# Patient Record
Sex: Female | Born: 1975 | Race: Black or African American | Hispanic: No | Marital: Married | Smoking: Never smoker
Health system: Southern US, Community
[De-identification: ages and names within clinical notes are randomized; demographics above are authoritative.]

## PROBLEM LIST (undated history)

## (undated) DIAGNOSIS — E119 Type 2 diabetes mellitus without complications: Secondary | ICD-10-CM

## (undated) DIAGNOSIS — D219 Benign neoplasm of connective and other soft tissue, unspecified: Secondary | ICD-10-CM

## (undated) HISTORY — DX: Type 2 diabetes mellitus without complications: E11.9

---

## 2001-01-16 ENCOUNTER — Emergency Department (HOSPITAL_COMMUNITY): Admission: EM | Admit: 2001-01-16 | Discharge: 2001-01-16 | Payer: Self-pay | Admitting: Emergency Medicine

## 2001-01-16 ENCOUNTER — Encounter: Payer: Self-pay | Admitting: Emergency Medicine

## 2001-06-03 ENCOUNTER — Inpatient Hospital Stay (HOSPITAL_COMMUNITY): Admission: AD | Admit: 2001-06-03 | Discharge: 2001-06-03 | Payer: Self-pay | Admitting: Obstetrics and Gynecology

## 2001-06-14 ENCOUNTER — Other Ambulatory Visit: Admission: RE | Admit: 2001-06-14 | Discharge: 2001-06-14 | Payer: Self-pay | Admitting: Obstetrics and Gynecology

## 2001-08-20 ENCOUNTER — Encounter: Admission: RE | Admit: 2001-08-20 | Discharge: 2001-08-20 | Payer: Self-pay | Admitting: Obstetrics and Gynecology

## 2001-10-22 ENCOUNTER — Ambulatory Visit (HOSPITAL_COMMUNITY): Admission: RE | Admit: 2001-10-22 | Discharge: 2001-10-22 | Payer: Self-pay | Admitting: Obstetrics and Gynecology

## 2001-10-22 ENCOUNTER — Encounter: Payer: Self-pay | Admitting: Obstetrics and Gynecology

## 2001-10-25 ENCOUNTER — Encounter: Payer: Self-pay | Admitting: Obstetrics and Gynecology

## 2001-10-25 ENCOUNTER — Inpatient Hospital Stay (HOSPITAL_COMMUNITY): Admission: AD | Admit: 2001-10-25 | Discharge: 2001-10-27 | Payer: Self-pay | Admitting: Obstetrics and Gynecology

## 2001-10-26 ENCOUNTER — Encounter: Payer: Self-pay | Admitting: Obstetrics and Gynecology

## 2001-11-17 ENCOUNTER — Inpatient Hospital Stay (HOSPITAL_COMMUNITY): Admission: AD | Admit: 2001-11-17 | Discharge: 2001-11-17 | Payer: Self-pay | Admitting: Obstetrics and Gynecology

## 2001-12-15 ENCOUNTER — Inpatient Hospital Stay (HOSPITAL_COMMUNITY): Admission: AD | Admit: 2001-12-15 | Discharge: 2001-12-18 | Payer: Self-pay | Admitting: Obstetrics and Gynecology

## 2001-12-15 ENCOUNTER — Encounter (INDEPENDENT_AMBULATORY_CARE_PROVIDER_SITE_OTHER): Payer: Self-pay

## 2002-10-25 ENCOUNTER — Emergency Department (HOSPITAL_COMMUNITY): Admission: EM | Admit: 2002-10-25 | Discharge: 2002-10-26 | Payer: Self-pay | Admitting: Emergency Medicine

## 2002-10-26 ENCOUNTER — Encounter: Payer: Self-pay | Admitting: Emergency Medicine

## 2002-11-29 ENCOUNTER — Encounter: Admission: RE | Admit: 2002-11-29 | Discharge: 2002-12-12 | Payer: Self-pay | Admitting: Occupational Medicine

## 2004-12-25 ENCOUNTER — Ambulatory Visit: Payer: Self-pay | Admitting: Internal Medicine

## 2005-09-29 ENCOUNTER — Ambulatory Visit: Payer: Self-pay | Admitting: Internal Medicine

## 2011-01-24 NOTE — Discharge Summary (Signed)
[facility] of [facility]  Patient:    Virginia Mcdonald, Virginia Mcdonald Visit Number: [id number] MRN: [id number]          Service Type: OBS Location: [facility] Attending Physician:  Leonard Schwartz Dictated by:   Janine Limbo, M.D. Adm. Date:  10/25/01 Disc. Date: 10/27/01   CC:         Daisey Must, M.D. [facility]   Discharge Summary  DISCHARGE DIAGNOSES:          1. Gestation at 31 weeks.                               2. Third trimester bleeding of uncertain                                  etiology.                               3. Gestational diabetes.                               4. Tachycardia and hypoxemia believed to be                                  secondary to medication.  PROCEDURES THIS ADMISSION:    None.  HISTORY OF PRESENT ILLNESS:   Virginia Mcdonald is a 35 year old female, gravida 4, para 2-0-1-2, who presented to the emergency department at [facility] on October 25, 2001, complaining of a slight amount of vaginal bleeding.  The patient is currently [redacted] weeks pregnant [facility] December 29, 2001).  She reported having irregular mild contractions.  Please see her dictated History & Physical exam for details.  Physical exam showed the cervix closed, long, and high.  The patient was noted to have mild contractions.  Her vital signs were stable.  HOSPITAL COURSE:              The patient was evaluated in the emergency department at [facility].  She was given Procardia to relieve the contractions.  Shortly after her medication, she noticed the sudden onset of tachycardia.  Her oxygen saturation dropped to 45% and then slowly rose back to 99% on oxygen.  Her blood pressure was 117/54.  Her blood sugar was 119. There was an increase in the fetal heart rate to 160 to 170 beats per minute. Evaluation included a chest x-ray which showed a normal heart size and was basically clear except for slight increase in pulmonary markings.  Her hemoglobin was 12.9.   Her white blood cell count was 7900.  Her platelet count was 145,000.  Her chemistries were within normal limits.  Her arterial blood gas showed a pH of 7.45, PCO2 of 32, and a PO2 of 68.  Her EKG was within normal limits.  An ultrasound was obtained that showed a single intrauterine gestation in a cephalic presentation.  The other parameters were within normal limits.  Her D-dimer was within normal limits.  A CT scan was obtained, and it was not consistent with a pulmonary embolus. Slowly, the patients symptoms all resolved.  It was thought that the tachycardia and the hypoxemia probably was a reflex reaction to the Procardia  that she had received.  By October 27, 2001, the patient was feeling much better.  She was not having any bleeding.  The fetal heart tones were all reassuring.  The decision was made to discharge the patient to home.   The patient did have a 2-D echocardiogram performed, and it was thought to be within normal limits.  DISCHARGE INSTRUCTIONS:       The patient will return to see Dr. Stefano Gaul for routine obstetrical care.  She will call for any other questions or concerns.Dictated by:   Janine Limbo, M.D. Attending Physician:  Leonard Schwartz DD:  12/18/01 TD:  12/19/01 Job: [phone] [id number]

## 2011-01-24 NOTE — H&P (Signed)
[facility] of [facility]  Patient:    Virginia Mcdonald, Virginia Mcdonald Visit Number: [id number] MRN: [id number]          Service Type: OBS Location: [facility] Attending Physician:  Shaune Spittle Dictated by:   Janine Limbo, M.D. Admit Date:  11/17/2001 Discharge Date: 11/17/2001                           History and Physical  DATE OF SURGERY:              December 15, 2001  HISTORY OF PRESENT ILLNESS:   Virginia Mcdonald is a 35 year old female, gravida 4, para 2-0-0-1-2, who presents at [redacted] weeks gestation (EDC is December 28, 2001) for a cesarean section and tubal ligation.  The patient has been followed at [facility] and Gynecology for this pregnancy that has been complicated by the fact that her infant is currently in a breech position and she has oligohydramnios.  In addition, she has a history of macrosomia.  She also has gestational diabetes mellitus and her sugars have been well controlled on diet only.  She also has a history of thrombocytopenia.  OBSTETRICAL HISTORY:          In 1994, patient had a 9 pound, 10 ounce delivery at term.  In 1996, she had an 8 pound, 12 ounce vaginal delivery at term.  In March 2001, she had an elective first-trimester pregnancy termination.  DRUG ALLERGIES:               None known.  PAST MEDICAL HISTORY:         The patient had rheumatic fever as a baby.  She is without sequelae at this point.  SOCIAL HISTORY:               The patient denies cigarette use, alcohol use, and recreational drug use.  REVIEW OF SYSTEMS:            Normal pregnancy complaints.  FAMILY HISTORY:               Noncontributory.  PHYSICAL EXAMINATION:  VITAL SIGNS:                  Weight is 143 pounds.  HEENT:                        Within normal limits.  CHEST:                        Clear.  HEART:                        Regular rate and rhythm.  BREASTS:                      Without masses.  ABDOMEN:                       Gravid with a fundal height of 37 cm.  EXTREMITIES:                  Within normal limits.  NEUROLOGIC:                   Exam is grossly normal.  PELVIC:  Cervix is closed and long.  LABORATORY VALUES:            Blood type is O positive, antibody screen negative, sickle cell negative, VDRL nonreactive, rubella immune.  HBsAg negative.  HIV nonreactive.  GC negative.  Chlamydia negative. Alpha-fetoprotein within normal limits.  Third-trimester beta strep is positive.  ASSESSMENT:                   1. Term intrauterine pregnancy.                               2. Breech presentation.                               3. Desires sterilization.                               4. History of macrosomia.                               5. Well-controlled gestational diabetes mellitus                                  by diet.  PLAN:                         The patient will undergo a primary low-transverse cesarean section and tubal ligation.  She understands the indications for her procedure and she accepts the risks of, but not limited to, anesthetic complications, bleeding, infections, possible damage to surrounding organs, and possible tubal failure (17 per 1000). Dictated by:   Janine Limbo, M.D. Attending Physician:  Shaune Spittle DD:  12/11/01 TD:  12/11/01 Job: [id number]

## 2011-01-24 NOTE — Discharge Summary (Signed)
[facility]  Patient:    Virginia Mcdonald, Virginia Mcdonald Visit Number: [id number] MRN: [id number]          Service Type: OBS Location: [facility] Attending Physician:  Leonard Schwartz Dictated by:   Wynelle Bourgeois, CNM Adm. Date:  12/15/01 Disc. Date: 12/18/01                             Discharge Summary  DATE OF BIRTH:                Jun 05, 1976  ADMISSION DIAGNOSES:          1. Term pregnancy.                               2. Footling breach presentation.                               3. Sterilization request.  DISCHARGE DIAGNOSES:          1. Term pregnancy.                               2. Footling breach presentation.                               3. Sterilization request.                               4. Status post primary low transverse cesarean                                  section of a female infant named Malia,                                  weight 6 pounds 15 ounces, Apgars 9/9.  PROCEDURES:                   1. Spinal anesthesia.                               2. Primary low transverse cesarean section.                               3. Elective bilateral tubal ligation.  HOSPITAL COURSE:              The patient was admitted for an elective primary low transverse cesarean section and tubal ligation for footling breach presentation.  This was performed by Dr. Stefano Gaul, and the patient did well. On postop day #1, hemoglobin was 11.3, and she progressed well with taking food and converting to analgesic by mouth.  On the third postop day, her vital signs were stable.  She was afebrile.  Heart had regular rate and rhythm. Lungs were clear.  Abdomen was mildly distended with positive bowel sounds. Incision with staples was clean, dry, and intact.  Extremities were within normal limits.  Lochia was small to moderated.  She was deemed to have received full benefit of her hospital stay and was discharged home.  DISCHARGE MEDICATIONS:         1. Motrin 600 mg p.o. q.6h. p.r.n.                               2. Tylox 1 to 2 p.o. q.3-4h. p.r.n.                               3. Prenatal vitamins.  DISCHARGE LABORATORY DATA:    White blood cell count 9.8, hemoglobin 11.3, hematocrit 33.1, platelets 107.  RPR nonreactive.  DISCHARGE INSTRUCTIONS:       Per [facility] handout.  FOLLOWUP:                     In six weeks at [facility] or p.r.n. Dictated by:   Wynelle Bourgeois, CNM Attending Physician:  Leonard Schwartz DD:  12/18/01 TD:  12/19/01 Job: [id number]

## 2011-01-24 NOTE — Op Note (Signed)
[facility] of [facility]  Patient:    Virginia Mcdonald, Virginia Mcdonald Visit Number: [id number] MRN: [id number]          Service Type: OBS Location: [facility] Attending Physician:  Leonard Schwartz Dictated by:   Janine Limbo, M.D. Proc. Date: 12/15/01                             Operative Report  PREOPERATIVE DIAGNOSES:       1. Term intrauterine pregnancy.                               2. Breech presentation.                               3. Desires sterilization.  POSTOPERATIVE DIAGNOSES:      1. Term intrauterine pregnancy.                               2. Footling breech presentation.                               3. Desires sterilization.  PROCEDURE:                    Primary low transverse cesarean section and bilateral tubal ligation.  SURGEON:                      Janine Limbo, M.D.  FIRST ASSISTANT:              Mack Guise, C.N.M.  ANESTHESIA:                   Spinal.  DISPOSITION:                  Ms. Crosley is a 35 year old female gravida 4, para 2-0-1-2 who presents at term.  She has a breech infant.  She understands the indications for her procedure and she accepts the risks of, but not limited to, anesthetic complications, bleeding, infections, and possible damage to the surrounding organs.  This pregnancy has been complicated by well controlled gestational diabetes.  The patient also has had oligohydramnios.  FINDINGS:                     A 6 pound 15 ounce female infant (Nelia) was delivered from a double footling breech presentation.  The Apgars were 9 at one minute and 9 at five minutes.  The uterus, fallopian tubes, and ovaries were normal for the gravid state.  PROCEDURE:                    The patient was taken to the operating room where a spinal anesthetic was given.  The patients abdomen and perineum were prepped with multiple layers of Betadine.  A Foley catheter was placed in the bladder.  The patient was sterilely  draped.  A low transverse incision was made in the abdomen and carried through the subcutaneous tissue, the fascia, and the anterior peritoneum.  An incision was made in the lower uterine segment and extended transversely.  The infant was delivered from a double footling breech presentation.  The cord was clamped and cut.  The infant was handed to the awaiting pediatric team.  Routine cord blood studies were obtained.  The placenta was removed.  The uterine cavity was cleaned of amniotic fluid and clotted blood.  The uterine incision was closed using a running locking suture of 2-0 Vicryl.  Hemostasis was adequate.  The left fallopian tube was identified and followed to its fimbriated end.  A knuckle of tube was made on the left using a free tie and then a suture ligature of 0 plain catgut.  The knuckle of tube thus made was excised.  An identical procedure was carried out on the opposite tube.  Again, hemostasis was noted to be adequate.  The pelvis and the abdominal cavity were vigorously irrigated.  Hemostasis was noted to be adequate.  The anterior peritoneum and the abdominal musculature were reapproximated in the midline using 2-0 Vicryl. The fascia was closed using a running suture of 0 Vicryl followed by three interrupted sutures of 0 Vicryl.  The subcutaneous layer was irrigated and hemostasis was noted to be adequate.  The subcutaneous layer was closed using interrupted sutures of 2-0 Vicryl.  The skin was reapproximated using skin staples.  Sponge, needle, and instrument count were correct on two occasions. The estimated blood loss for the procedure was 700 cc.  The patient tolerated her procedure well.  The patient was taken to the recovery room in stable condition.  The infant was taken to the full-term nursery in stable condition. Dictated by:   Janine Limbo, M.D. Attending Physician:  Leonard Schwartz DD:  12/15/01 TD:  12/16/01 Job: [phone] [id number]

## 2011-01-24 NOTE — Consult Note (Signed)
[facility] of [facility]  Patient:    Virginia Mcdonald, Virginia Mcdonald Visit Number: [id number] MRN: [id number]          Service Type: OBS Location: 9400 [id number] Attending Physician:  Leonard Schwartz Dictated by:   Daisey Must, M.D. [facility] Consultation Date: 10/26/01 Admit Date:  10/25/2001   CC:         Virginia Mcdonald, M.D.  Naima A. Normand Sloop, M.D.   Consultation Report  REASON FOR CONSULTATION:      Tachycardia and hypoxia.  HISTORY OF PRESENT ILLNESS:   Virginia Mcdonald is a 35 year old African female who is in her 31st week of her third pregnancy.  She presented to the hospital last evening with vaginal bleeding.  She was found to have contractions.  She was given, according to the patient, 10 mg of nifedipine p.o. x2.  She subsequently became very tachycardic and hypoxic with a pO2 of 51 on room air. By verbal report it was said that her heart rate increased to as high as 200; however, EKG available in the chart shows a heart rate of 143.  No other rhythm strips were available.  Apparently, her tachycardia subsequently resolved.  She underwent a chest x-ray and CT scan of the chest.  This showed no evidence of pulmonary emboli.  Chest x-ray showed clear lung fields with normal heart size.  The above symptoms subsequently resolved and she has not had any recurrences.  The patient denies any prior history of chest pain, dyspnea, palpitations, dizziness, or presyncope.  She states today that she still has some mild residual dyspnea, but otherwise feels well.  Currently, she has an oxygen saturation on room air of 95%.  PAST MEDICAL HISTORY:         Notable for gestational diabetes and history of rheumatic fever as a child.  MEDICATIONS:                  None.  FAMILY HISTORY:               As noted in the physicians assistant note on the chart.  SOCIAL HISTORY:               As noted in the physicians assistant note on the chart.  REVIEW OF SYSTEMS:             As noted in the physicians assistant note on the chart.  PHYSICAL EXAMINATION  GENERAL:                      This is a well appearing young woman in no acute distress.  VITAL SIGNS:                  Pulse 80 and regular, blood pressure 116/60, oxygen saturation on room air 95%.  SKIN:                         Warm and dry without generalized rash.  HEENT:                        Normocephalic, atraumatic.  Sclerae:  Anicteric. Oral mucosa:  Unremarkable.  NECK:                         No thyromegaly or adenopathy.  No JVD.  Carotid upstroke normal without bruits.  CHEST:  Clear to percussion and auscultation.  CARDIAC:                      Regular rate and rhythm.  Normal S1 and S2 with a 1/6 systolic ejection murmur loudest at the apex.  ABDOMEN:                      Gravid, soft, nontender.  Positive bowel sounds.  EXTREMITIES:                  No clubbing, cyanosis, edema with intact distal pulses.  LABORATORIES:                 EKG last evening shows sinus tachycardia at a rate of 143 with nonspecific ST and T-wave changes.  Other laboratory data is noted in the chart.  ASSESSMENT:                   Tachycardia and hypoxia after given p.o. nifedipine.  The patient states herself that she actually received two doses of p.o. nifedipine preceding the episode of tachycardia.  This suggests then that the tachycardia may have been reflex to oral rapid acting nifedipine. However, also of concern is the patients hypoxia at the time of this event. Certainly, would also consider the possibility of pulmonary embolism or fluid embolism as the etiology for the above events.  Also of concern is a persistent alveolar arterial oxygen gradient.  Current blood gas on room air reveals a pH of 7.45, pCO2 32, pO2 68 which is an alveolar arterial oxygen gradient of 42 with the normal being less than 15-25.  This is still suspicious for the possibility of pulmonary  embolism.  RECOMMENDATIONS:              1. Check a D-dimer to rule out thrombosis.  If                                  this is elevated would pursue venous Dopplers                                  to rule out deep venous thrombosis.  If                                  venous Dopplers do show DVT the patient would                                  require anticoagulation.  If there is no                                  evidence of DVT, but the patient still has an                                  elevated D-dimer and cannot rule out the  possibility of pulmonary embolism, then would                                  consider consulting pulmonary medicine for                                  their opinion.                               2. Based on her history of rheumatic heart                                  disease and her episode of tachycardia, would                                  recommend a 2-D echocardiogram.  This can be                                  obtained either as an in patient or as an                                  outpatient.                               3. In the absence of recurrent tachy                                  palpitations, would not recommend outpatient                                  Holter monitoring at this time.  However, if                                  the patient has a recurrent episode of tachy                                  palpitations, would consider placing an event                                  monitor or a 24-hour Holter monitor as an                                  outpatient.  Please let me know if I can be                                  of further assistance. Dictated by:   Daisey Must, M.D. [facility] Attending Physician:  Leonard Schwartz DD:  10/26/01 TD:  10/26/01 Job: [id number]

## 2011-01-24 NOTE — H&P (Signed)
[facility] of [facility]  Patient:    Virginia Mcdonald, Virginia Mcdonald Visit Number: [id number] MRN: [id number]          Service Type: OBS Location: 9400 [id number] Attending Physician:  Leonard Schwartz Dictated by:   Wynelle Bourgeois, CNM Admit Date:  10/25/2001                           History and Physical  HISTORY OF PRESENT ILLNESS:   This is a 35 year old gravida 4, para 2-0-1-2 at 31-0/7 weeks who presented to the MAU with complaints of one episode of bright red spotting this morning.  She was reporting irregular contractions, which were painful.  Her pregnancy had been remarkable for:  1. Hyperemesis. 2. History of macrosomia. 3. History of rapid labor. 4. Diet-controlled gestational diabetes.  Her cervix was closed, long, and high and she was treated with one dose of Procardia for preterm contractions, which were noted on the monitor. Concurrently with dosing of Procardia (that is, before the Procardia had taken effect), the patient began to complain of her heart racing and her heart rate was noted to be 160-200 beats per minute.  The patient denied chest pain, shortness of breath, confusion, or abdominal pain.  Her primary complaint was of her heart racing.  She was found to have an oxygen saturation which dropped to 45% and resolved up to 99% after the administration of 8 L of oxygen by face mask.  A stat EKG and chest x-ray and blood work were done.  The EKG revealed sinus tachycardia without ectopy with nonspecific ST and T wave abnormality.  She was seen immediately by this nurse midwife and Dr. Stefano Gaul in the MAU and decision was made to admit her to the AICU for further assessment and monitoring.  OBJECTIVE DATA:  VITAL SIGNS:                  Vital signs are currently stable with sinus tachycardia of 107, respiratory rate of 22, and a blood pressure of 117/66. Oxygen saturation is 100% on face mask oxygen at 8 L per minute.  Her previous heart rate  had been 200 beats per minute.  S1 and S2 audible without murmur. EKG, as stated before, denoted sinus tachycardia without ectopy and nonspecific ST and T wave abnormality and moderate voltage criteria for left ventricular hypertrophy, which may be a normal variant.  Her chest x-ray is within normal limits with normal heart size.  Arterial blood gas on oxygen shows a pH of 7.4, a PO2 of 51.6, PCO2 of 31.4, bicarbonate of 19.1, base deficit 4.1, and oxygen saturation of 99%.  CBC was within normal limits with a hemoglobin of 12.9 and remarkable for a platelet count of 145.  Metabolic chemistries were all within normal limits.  Her glucose level was 118. Ultrasound showed cephalic presentation, fundal placenta at grade 1, AFI of 9.2, BPP 8/8, cervix 3.5 cm.  CHEST:                        Clear to auscultation anterior and posterior.  HEART:                        Heart rate regular rhythm with tachycardia.  No murmur auscultated.  ABDOMEN:                      Gravid at 79  cm.  Fetal heart rate tracing is reassuring and reactive.  Uterine contractions before treatment upon admission were every 4-7 minutes and are now absent.  CERVICAL:                     Closed, long, and high.  Speculum exam revealed a thin, light-brown discharge with no lesions and no blood in the vault.  GC, chlamydia, wet prep, and group B Strep cultures were sent.  UA was within normal limits.  Her cultures were pending.  ASSESSMENT:                   1. Intrauterine pregnancy at 31 weeks.                               2. Spotting.                               3. Preterm uterine contractions, now resolved.                               4. Tachycardia with unknown etiology and without                                  hemodynamic compromise.                               5. Hypoxia, rule out pulmonary embolism,                                  amniotic fluid embolism, and placental                                   abruption.  PLAN:                         1. Admit to adult intensive care unit per                                  Dr. Stefano Gaul.                               2. Spiral CT.                               3. Observation in the adult intensive care unit                                  and further orders to follow. Dictated by:   Wynelle Bourgeois, CNM Attending Physician:  Leonard Schwartz DD:  10/25/01 TD:  10/25/01 Job: [id number]

## 2012-11-30 ENCOUNTER — Other Ambulatory Visit: Payer: Self-pay | Admitting: Obstetrics and Gynecology

## 2012-12-01 LAB — PAP IG W/ RFLX HPV ASCU

## 2013-05-22 ENCOUNTER — Emergency Department (HOSPITAL_COMMUNITY)
Admission: EM | Admit: 2013-05-22 | Discharge: 2013-05-22 | Disposition: A | Discharge status: Home / Self Care | Payer: BC Managed Care – PPO | Attending: Emergency Medicine | Admitting: Emergency Medicine

## 2013-05-22 ENCOUNTER — Encounter (HOSPITAL_COMMUNITY): Payer: Self-pay

## 2013-05-22 DIAGNOSIS — M545 Low back pain, unspecified: Secondary | ICD-10-CM | POA: Insufficient documentation

## 2013-05-22 DIAGNOSIS — M79609 Pain in unspecified limb: Secondary | ICD-10-CM | POA: Insufficient documentation

## 2013-05-22 DIAGNOSIS — M791 Myalgia, unspecified site: Secondary | ICD-10-CM

## 2013-05-22 DIAGNOSIS — R519 Headache, unspecified: Secondary | ICD-10-CM

## 2013-05-22 DIAGNOSIS — Z880 Allergy status to penicillin: Secondary | ICD-10-CM | POA: Insufficient documentation

## 2013-05-22 DIAGNOSIS — IMO0001 Reserved for inherently not codable concepts without codable children: Secondary | ICD-10-CM | POA: Insufficient documentation

## 2013-05-22 DIAGNOSIS — R51 Headache: Secondary | ICD-10-CM | POA: Insufficient documentation

## 2013-05-22 LAB — POCT I-STAT, CHEM 8
BUN: 16 mg/dL (ref 6–23)
Calcium, Ion: 1.22 mmol/L (ref 1.12–1.23)
Chloride: 102 mEq/L (ref 96–112)
Creatinine, Ser: 1.6 mg/dL — ABNORMAL HIGH (ref 0.50–1.10)
Glucose, Bld: 98 mg/dL (ref 70–99)
HCT: 44 % (ref 36.0–46.0)
Hemoglobin: 15 g/dL (ref 12.0–15.0)
Potassium: 5.6 mEq/L — ABNORMAL HIGH (ref 3.5–5.1)
Sodium: 139 mEq/L (ref 135–145)
TCO2: 29 mmol/L (ref 0–100)

## 2013-05-22 LAB — BASIC METABOLIC PANEL
BUN: 12 mg/dL (ref 6–23)
CO2: 25 mEq/L (ref 19–32)
Calcium: 9.6 mg/dL (ref 8.4–10.5)
Chloride: 100 mEq/L (ref 96–112)
Creatinine, Ser: 1.16 mg/dL — ABNORMAL HIGH (ref 0.50–1.10)
GFR calc Af Amer: 69 mL/min — ABNORMAL LOW (ref >90)
GFR calc non Af Amer: 60 mL/min — ABNORMAL LOW (ref >90)
Glucose, Bld: 104 mg/dL — ABNORMAL HIGH (ref 70–99)
Potassium: 4.1 mEq/L (ref 3.5–5.1)
Sodium: 135 mEq/L (ref 135–145)

## 2013-05-22 MED ORDER — IBUPROFEN 800 MG PO TABS
800.0000 mg | ORAL_TABLET | Freq: Once | ORAL | Status: AC
  Administered 2013-05-22: 800 mg via ORAL
  Filled 2013-05-22: qty 1

## 2013-05-22 MED ORDER — NAPROXEN 500 MG PO TABS: 500.0000 mg | ORAL_TABLET | Freq: Two times a day (BID) | ORAL | Status: DC

## 2013-05-22 NOTE — ED Provider Notes (Signed)
This chart was scribed for [name], a non-physician practitioner working with [name], MD by Lewanda Rife, ED Scribe. This patient was seen in room WTR7/WTR7 and the patient's care was started at 2120.    CSN: [id number]     Arrival date & time 05/22/13  2023 History   First MD Initiated Contact with Patient 05/22/13 2031     Chief Complaint  Patient presents with  . Extremity Pain  . Headache   The history is provided by the patient.   HPI Comments: Virginia Mcdonald is a 37 y.o. female who presents to the Emergency Department complaining of waxing and waning moderate bilateral arms and bilateral leg pain onset last night. Describes pain as sharp and burning. Reports associated constant moderate left sided headache, and lower back pain. Denies any aggravating factors. Denies associated weakness, numbness, change in activity, fevers, chills, sweats, sick contacts, neck pain, recent travels, emesis, nausea, abdominal pain, swelling, recent trauma, dysuria, hematuria, frequency with urination, menstrand recent injuries. Reports taking 200 mg of ibuprofen yesterday and today with moderate relief of symptoms. Reports hx of migraines. Denies any other pertinent PMHx and familial hx. Marland Kitchen   LMP May 15, 2013    History reviewed. No pertinent past medical history. Past Surgical History  Procedure Laterality Date  . Cesarean section     History reviewed. No pertinent family history. History  Substance Use Topics  . Smoking status: Never Smoker   . Smokeless tobacco: Not on file  . Alcohol Use: No   OB History   Grav Para Term Preterm Abortions TAB SAB Ect Mult Living                 Review of Systems  Neurological: Positive for headaches.  All other systems reviewed and are negative.   A complete 10 system review of systems was obtained and all systems are negative except as noted in the HPI and PMH.     Allergies  Penicillins and Procardia  Home  Medications   Current Outpatient Rx  Name  Route  Sig  Dispense  Refill  . ibuprofen (ADVIL,MOTRIN) 200 MG tablet   Oral   Take 200 mg by mouth every 6 (six) hours as needed for pain (pain).          BP 101/63  Pulse 72  Temp(Src) 98.3 F (36.8 C) (Oral)  Resp 16  SpO2 100%  LMP 05/15/2013 Physical Exam  Nursing note and vitals reviewed. Constitutional: She is oriented to person, place, and time. She appears well-developed and well-nourished. No distress.  HENT:  Head: Normocephalic and atraumatic.  Eyes: Conjunctivae and EOM are normal. Pupils are equal, round, and reactive to light. No scleral icterus. Right eye exhibits no nystagmus. Left eye exhibits no nystagmus.  Neck: Normal range of motion and full passive range of motion without pain. Neck supple. No spinous process tenderness and no muscular tenderness present. No tracheal deviation present.  Cardiovascular: Normal rate and regular rhythm.   No murmur heard. Pulmonary/Chest: Effort normal and breath sounds normal. No respiratory distress. She has no wheezes.  Abdominal: Soft. There is no tenderness. There is no CVA tenderness.  Musculoskeletal: Normal range of motion. She exhibits no edema and no tenderness.       Cervical back: Normal. She exhibits no tenderness and no bony tenderness.       Thoracic back: Normal. She exhibits no tenderness and no bony tenderness.       Lumbar back:  Normal. She exhibits no bony tenderness and no swelling.  Neurological: She is alert and oriented to person, place, and time. She has normal strength. No sensory deficit.  Skin: Skin is warm and dry. No rash noted. No erythema.  Psychiatric: She has a normal mood and affect. Her behavior is normal.    ED Course  Procedures  Medications  ibuprofen (ADVIL,MOTRIN) tablet 800 mg (800 mg Oral Given 05/22/13 2137)   Results for orders placed during the hospital encounter of 05/22/13  BASIC METABOLIC PANEL      Result Value Range   Sodium  135  135 - 145 mEq/L   Potassium 4.1  3.5 - 5.1 mEq/L   Chloride 100  96 - 112 mEq/L   CO2 25  19 - 32 mEq/L   Glucose, Bld 104 (*) 70 - 99 mg/dL   BUN 12  6 - 23 mg/dL   Creatinine, Ser 1.61 (*) 0.50 - 1.10 mg/dL   Calcium 9.6  8.4 - 09.6 mg/dL   GFR calc non Af Amer 60 (*) >90 mL/min   GFR calc Af Amer 69 (*) >90 mL/min  POCT I-STAT, CHEM 8      Result Value Range   Sodium 139  135 - 145 mEq/L   Potassium 5.6 (*) 3.5 - 5.1 mEq/L   Chloride 102  96 - 112 mEq/L   BUN 16  6 - 23 mg/dL   Creatinine, Ser 0.45 (*) 0.50 - 1.10 mg/dL   Glucose, Bld 98  70 - 99 mg/dL   Calcium, Ion 4.09  8.11 - 1.23 mmol/L   TCO2 29  0 - 100 mmol/L   Hemoglobin 15.0  12.0 - 15.0 g/dL   HCT 91.4  78.2 - 95.6 %          MDM   1. Headache   2. Myalgia      Patient seen and evaluated. She appears well in no acute distress. She has a normal nonfocal neuro exam. Symptoms have been intermittent. She has been doing increased exercise ending but states she feels well hydrated. Will quickly checked i-STAT Chem-8 for electrolytes.  Recheck of potassium and creatinine much better on lab BMP. At this time do not feel there are any emergent condition causing patient's symptoms. She already reports significant improvement of headache and pain symptoms after ibuprofen. At this time will continued recommendations for NSAID treatment and followup with PCP.  I personally performed the services described in this documentation, which was scribed in my presence. The recorded information has been reviewed and is accurate.   Angus Seller, PA-C 05/23/13 [phone]

## 2013-05-22 NOTE — ED Notes (Signed)
Pt states started yesterday with bilateral leg pain then radiated to bilateral arms.  Feels heavy, aches.  Pt states also feels like bilateral hands are stiff.  Does claim she exercises regularly,.

## 2013-05-24 NOTE — ED Provider Notes (Signed)
Medical screening examination/treatment/procedure(s) were performed by non-physician practitioner and as supervising physician I was immediately available for consultation/collaboration.   [name], MD 05/24/13 1134

## 2013-07-15 ENCOUNTER — Ambulatory Visit (INDEPENDENT_AMBULATORY_CARE_PROVIDER_SITE_OTHER): Payer: BC Managed Care – PPO | Admitting: Internal Medicine

## 2013-07-15 VITALS — BP 100/68 | HR 89 | Temp 98.3°F | Resp 16 | Ht 63.0 in | Wt 123.0 lb

## 2013-07-15 DIAGNOSIS — J209 Acute bronchitis, unspecified: Secondary | ICD-10-CM

## 2013-07-15 MED ORDER — AZITHROMYCIN 500 MG PO TABS: 500.0000 mg | ORAL_TABLET | Freq: Every day | ORAL | Status: DC

## 2013-07-15 MED ORDER — HYDROCODONE-ACETAMINOPHEN 7.5-325 MG/15ML PO SOLN: 5.0000 mL | Freq: Four times a day (QID) | ORAL | Status: DC | PRN

## 2013-07-15 NOTE — Patient Instructions (Signed)
If symptoms do not improve please return for check of blood count and chest xray. Bronchitis Bronchitis is the body's way of reacting to injury and/or infection (inflammation) of the bronchi. Bronchi are the air tubes that extend from the windpipe into the lungs. If the inflammation becomes severe, it may cause shortness of breath. CAUSES  Inflammation may be caused by:  A virus.  Germs (bacteria).  Dust.  Allergens.  Pollutants and many other irritants. The cells lining the bronchial tree are covered with tiny hairs (cilia). These constantly beat upward, away from the lungs, toward the mouth. This keeps the lungs free of pollutants. When these cells become too irritated and are unable to do their job, mucus begins to develop. This causes the characteristic cough of bronchitis. The cough clears the lungs when the cilia are unable to do their job. Without either of these protective mechanisms, the mucus would settle in the lungs. Then you would develop pneumonia. Smoking is a common cause of bronchitis and can contribute to pneumonia. Stopping this habit is the single most important thing you can do to help yourself. TREATMENT   Your caregiver may prescribe an antibiotic if the cough is caused by bacteria. Also, medicines that open up your airways make it easier to breathe. Your caregiver may also recommend or prescribe an expectorant. It will loosen the mucus to be coughed up. Only take over-the-counter or prescription medicines for pain, discomfort, or fever as directed by your caregiver.  Removing whatever causes the problem (smoking, for example) is critical to preventing the problem from getting worse.  Cough suppressants may be prescribed for relief of cough symptoms.  Inhaled medicines may be prescribed to help with symptoms now and to help prevent problems from returning.  For those with recurrent (chronic) bronchitis, there may be a need for steroid medicines. SEEK IMMEDIATE  MEDICAL CARE IF:   During treatment, you develop more pus-like mucus (purulent sputum).  You have a fever.  You become progressively more ill.  You have increased difficulty breathing, wheezing, or shortness of breath. It is necessary to seek immediate medical care if you are elderly or sick from any other disease. MAKE SURE YOU:   Understand these instructions.  Will watch your condition.  Will get help right away if you are not doing well or get worse. Document Released: 08/25/2005 Document Revised: 04/27/2013 Document Reviewed: 04/19/2013 [facility] Patient Information 2014 [place], [place].

## 2013-07-15 NOTE — Progress Notes (Signed)
  Subjective:    Patient ID: Virginia Mcdonald, female    DOB: September 25, 1975, 37 y.o.   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 Symptoms started on Tuesday. Has felt feverish, has had chills. Denies being around anyone that has been sick or anyone at home sick. Has tried theraflu day and night , as well as advil for symptoms. Denies any nausea or vomiting. Patient is non smoker. Has not traveled out of the country.  Review of Systems healthy    Objective:   Physical Exam  Constitutional: She is oriented to person, place, and time. She appears well-developed and well-nourished. No distress.  HENT:  Head: Normocephalic.  Right Ear: External ear normal.  Left Ear: External ear normal.  Nose: Mucosal edema and rhinorrhea present.  Mouth/Throat: Oropharynx is clear and moist.  Eyes: EOM are normal.  Neck: Normal range of motion. Neck supple.  Cardiovascular: Normal rate, regular rhythm and normal heart sounds.   Pulmonary/Chest: Effort normal. Not tachypneic. She has no decreased breath sounds. She has wheezes. She has rhonchi.  Abdominal: Soft. Bowel sounds are normal.  Neurological: She is alert and oriented to person, place, and time. She exhibits normal muscle tone. Coordination normal.  Psychiatric: She has a normal mood and affect. Her behavior is normal.          Assessment & Plan:  Bronchitis

## 2013-07-15 NOTE — Progress Notes (Signed)
  Subjective:    Patient ID: Virginia Mcdonald, female    DOB: 09/21/1975, 37 y.o.   MRN: [id number]  HPI    Review of Systems     Objective:   Physical Exam        Assessment & Plan:

## 2013-12-29 ENCOUNTER — Ambulatory Visit (INDEPENDENT_AMBULATORY_CARE_PROVIDER_SITE_OTHER): Payer: BC Managed Care – PPO | Admitting: Family Medicine

## 2013-12-29 VITALS — BP 112/74 | HR 78 | Temp 98.2°F | Resp 18 | Ht 62.5 in | Wt 120.0 lb

## 2013-12-29 DIAGNOSIS — J040 Acute laryngitis: Secondary | ICD-10-CM

## 2013-12-29 DIAGNOSIS — R51 Headache: Secondary | ICD-10-CM

## 2013-12-29 DIAGNOSIS — J329 Chronic sinusitis, unspecified: Secondary | ICD-10-CM

## 2013-12-29 MED ORDER — TRAMADOL HCL 50 MG PO TABS: ORAL_TABLET | ORAL | Status: DC

## 2013-12-29 MED ORDER — AZITHROMYCIN 250 MG PO TABS: ORAL_TABLET | ORAL | Status: DC

## 2013-12-29 MED ORDER — FLUTICASONE PROPIONATE 50 MCG/ACT NA SUSP: 2.0000 | Freq: Every day | NASAL | Status: DC

## 2013-12-29 NOTE — Progress Notes (Signed)
Subjective Patient has been sick for the past couple of weeks. She had a upper respiratory infection. Then she has proceeded to get a headache on the left side of her face and head which has persisted. She has remained congested in her sinuses. She has had a little sore throat but mostly stays hoarse. S. felt a little run down.  Objective: TMs are normal. Nose clear. Sinuses mildly tender. Throat clear. Neck supple without nodes. Chest clear.  Assessment: Headache Sinusitis Upper respiratory infection Laryngitis  Plan: Antibiotics and pain medication. Return if worse

## 2013-12-29 NOTE — Patient Instructions (Signed)
Drink plenty of fluids and get enough rest  Take the azithromycin 2 pills initially, then one daily for 4 days for infection  Use the fluticasone nose spray 2 sprays in each nostril twice daily for 3 days, then decrease to once daily  Take over-the-counter Claritin or Zyrtec or Allegra one daily  Use the tramadol one pill every 6 or 8 hours if needed for bad headache. This medicine compliance very well with Tylenol (acetaminophen) and if he takes one Tylenol with one tramadol it gives much better pain relief.  Return if not improving

## 2013-12-30 ENCOUNTER — Telehealth: Payer: Self-pay | Admitting: *Deleted

## 2013-12-30 NOTE — Telephone Encounter (Signed)
Pt feels dizzy and nauseated after taking the antibiotic and pain med.  I advised her that if she has no pain then she does not have to take the pain med, which she stated that she did not have any pain.  Can we call in something for the nausea and maybe the dizziness.  If not pt can try to come in.  [phone]

## 2013-12-31 NOTE — Telephone Encounter (Signed)
Call in meclizine 12.5mg  #20 take 1 or 2 every 8 hours as needed for nausea or dizziness. It will make her drowsy.

## 2013-12-31 NOTE — Telephone Encounter (Signed)
Spoke with pt. Pt declines medicine. Says she is feeling a little better. If she doesn't get well she will RTC.

## 2015-12-10 ENCOUNTER — Other Ambulatory Visit (HOSPITAL_COMMUNITY)
Admission: RE | Admit: 2015-12-10 | Discharge: 2015-12-10 | Disposition: A | Discharge status: Home / Self Care | Payer: BLUE CROSS/BLUE SHIELD | Source: Ambulatory Visit | Attending: Obstetrics & Gynecology | Admitting: Obstetrics & Gynecology

## 2015-12-10 ENCOUNTER — Other Ambulatory Visit: Payer: Self-pay | Admitting: Obstetrics & Gynecology

## 2015-12-10 DIAGNOSIS — Z1151 Encounter for screening for human papillomavirus (HPV): Secondary | ICD-10-CM | POA: Insufficient documentation

## 2015-12-10 DIAGNOSIS — Z01419 Encounter for gynecological examination (general) (routine) without abnormal findings: Secondary | ICD-10-CM | POA: Insufficient documentation

## 2015-12-11 LAB — CYTOLOGY - PAP

## 2017-06-15 ENCOUNTER — Other Ambulatory Visit: Payer: Self-pay | Admitting: Obstetrics and Gynecology

## 2017-06-15 ENCOUNTER — Other Ambulatory Visit (HOSPITAL_COMMUNITY)
Admission: RE | Admit: 2017-06-15 | Discharge: 2017-06-15 | Disposition: A | Discharge status: Home / Self Care | Payer: BLUE CROSS/BLUE SHIELD | Source: Ambulatory Visit | Attending: Obstetrics and Gynecology | Admitting: Obstetrics and Gynecology

## 2017-06-15 DIAGNOSIS — Z01419 Encounter for gynecological examination (general) (routine) without abnormal findings: Secondary | ICD-10-CM | POA: Insufficient documentation

## 2017-06-15 LAB — HM PAP SMEAR

## 2017-06-17 LAB — CYTOLOGY - PAP: Diagnosis: NEGATIVE

## 2017-06-29 ENCOUNTER — Other Ambulatory Visit: Payer: Self-pay | Admitting: Internal Medicine

## 2017-06-29 DIAGNOSIS — Z1231 Encounter for screening mammogram for malignant neoplasm of breast: Secondary | ICD-10-CM

## 2017-09-14 ENCOUNTER — Other Ambulatory Visit: Payer: Self-pay | Admitting: Obstetrics and Gynecology

## 2017-09-28 ENCOUNTER — Ambulatory Visit: Payer: Self-pay

## 2018-02-08 ENCOUNTER — Ambulatory Visit
Admission: RE | Admit: 2018-02-08 | Discharge: 2018-02-08 | Disposition: A | Discharge status: Home / Self Care | Payer: BLUE CROSS/BLUE SHIELD | Source: Ambulatory Visit | Attending: Internal Medicine | Admitting: Internal Medicine

## 2018-02-08 DIAGNOSIS — Z1231 Encounter for screening mammogram for malignant neoplasm of breast: Secondary | ICD-10-CM

## 2018-05-24 DIAGNOSIS — R7303 Prediabetes: Secondary | ICD-10-CM

## 2018-07-05 ENCOUNTER — Encounter: Payer: BLUE CROSS/BLUE SHIELD | Attending: Internal Medicine | Admitting: Registered"

## 2018-07-05 ENCOUNTER — Encounter: Payer: Self-pay | Admitting: Registered"

## 2018-07-05 DIAGNOSIS — Z713 Dietary counseling and surveillance: Secondary | ICD-10-CM | POA: Diagnosis present

## 2018-07-05 DIAGNOSIS — E119 Type 2 diabetes mellitus without complications: Secondary | ICD-10-CM | POA: Diagnosis not present

## 2018-07-05 NOTE — Patient Instructions (Addendum)
Consider finding more ways to get protein in your diet: Nuts, dried beans, whey protein powder, eggs Continue have plenty of fish Consider having less fruit in your smoothies and try drinking within 2 hours. Consider cutting back on plantains to equal about 30 g carb in your meal and make sure you have protein in that meal. Consider checking your bloods sugar at different times, does not have to be daily:  Fasting, 2 hours after a meal, when you feel funny. Keep a log of timing around meal, write down why you think numbers might be high. Bring to next appointment. Consider adding more exercise: 3-5x per week, 30-45 min moderate intensity.

## 2018-07-05 NOTE — Progress Notes (Signed)
Diabetes Self-Management Education  Visit Type: First/Initial  Appt. Start Time: 0810 Appt. End Time: 0940  07/06/2018  [name], identified by name and date of birth, is a 42 y.o. female with a diagnosis of Diabetes: Type 2.   ASSESSMENT Pt states she wants to know what foods she can eat. Patient states she does not want to take medication and would like to be able to bring A1c back down with lifestyle changes. Pt states her mother who lives in [place] also has T2DM and has been able to stop taking insulin and just manage with metformin.  Pt states she checks her BG when feels dizzy, tired and gets readings 150-168. Pt denies hypoglycemia. Pt states she used to go to the gym everyday and she enjoys running. 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  Pt states smoothies breakfast is something that she sips on all day. 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 Pt states sometimes she will eat her meal later and will reheat her meal in the styrofoam to-go container.  Sleep: 9-10; 7-8 hrs, sleeps well. Stress: 3-4/10 low stress  Diabetes Self-Management Education - 07/05/18 1039      Visit Information   Visit Type  First/Initial      Initial Visit   Diabetes Type  Type 2    Are you currently following a meal plan?  No    Are you taking your medications as prescribed?  Not on Medications    Date Diagnosed  Sept 2019      Health Coping   How would you rate your overall health?  Excellent      Psychosocial Assessment   Patient Belief/Attitude about Diabetes  Other (comment)   doesn't understand why   How often do you need to have someone help you when you read instructions, pamphlets, or other written materials from your doctor or pharmacy?  1 - Never    What is the last grade level you completed in school?  12      Complications   Last HgB A1C per patient/outside source  6.8 %   per referral   How often do you check your blood sugar?  --   1x week when feels dizzy tired   Fasting Blood glucose range (mg/dL)  70-129   90-146   Number of hypoglycemic episodes per month  0    Number of hyperglycemic episodes per week  0    Have you had a dilated eye exam in the past 12 months?  Yes    Have you had a dental exam in the past 12 months?  Yes    Are you checking your feet?  Yes    How many days per week are you checking your feet?  7      Dietary Intake   Breakfast  berries, almonds, coconuts, herbalife scoop. OR flat bagel occassionally,     Snack (morning)  none    Lunch  vegetable plates, sweet potato OR cabbage, black-eyed peas, scoop of  dressing OR Thurs employer provides lunch- [employer], cracker barrel     Snack (afternoon)  none OR peanuts    Dinner  fish, green plantain, spinach (4 oz rice)    Snack (evening)  none    Beverage(s)  coffee, herbalife tea with aloe (for 7 yrs), herbal tea, water  Exercise   Exercise Type  Light (walking / raking leaves)    How many days per week to you exercise?  3    How many minutes per day do you exercise?  15    Total minutes per week of exercise  45      Patient Education   Disease state   Definition of diabetes, type 1 and 2, and the diagnosis of diabetes    Nutrition management   Role of diet in the treatment of diabetes and the relationship between the three main macronutrients and blood glucose level;Food label reading, portion sizes and measuring food.    Physical activity and exercise   Role of exercise on diabetes management, blood pressure control and cardiac health.    Medications  Reviewed patients medication for diabetes, action, purpose, timing of dose and side effects.   although not on medication, reviewed metformin for future reference if needed.   Monitoring  Purpose and frequency of SMBG.    Psychosocial adjustment  Role of stress on  diabetes      Individualized Goals (developed by patient)   Nutrition  General guidelines for healthy choices and portions discussed    Physical Activity  Exercise 3-5 times per week    Monitoring   test my blood glucose as discussed      Outcomes   Expected Outcomes  Demonstrated interest in learning. Expect positive outcomes    Future DMSE  4-6 wks    Program Status  Completed      Individualized Plan for Diabetes Self-Management Training:   Learning Objective:  Patient will have a greater understanding of diabetes self-management. Patient education plan is to attend individual and/or group sessions per assessed needs and concerns.  Patient Instructions  Consider finding more ways to get protein in your diet: Nuts, dried beans, whey protein powder, eggs Continue have plenty of fish Consider having less fruit in your smoothies and try drinking within 2 hours. Consider cutting back on plantains to equal about 30 g carb in your meal and make sure you have protein in that meal. Consider checking your bloods sugar at different times, does not have to be daily:  Fasting, 2 hours after a meal, when you feel funny. Keep a log of timing around meal, write down why you think numbers might be high. Bring to next appointment. Consider adding more exercise: 3-5x per week, 30-45 min moderate intensity.  Expected Outcomes:  Demonstrated interest in learning. Expect positive outcomes  Education material provided: A1C conversion sheet, My Plate and Snack sheet  If problems or questions, patient to contact team via:  Phone and MyChart  Future DSME appointment: 4-6 wks

## 2018-07-06 DIAGNOSIS — E119 Type 2 diabetes mellitus without complications: Secondary | ICD-10-CM | POA: Insufficient documentation

## 2018-07-08 ENCOUNTER — Telehealth: Payer: Self-pay

## 2018-07-09 ENCOUNTER — Ambulatory Visit: Payer: BLUE CROSS/BLUE SHIELD | Admitting: Nurse Practitioner

## 2018-07-09 ENCOUNTER — Encounter: Payer: Self-pay | Admitting: Nurse Practitioner

## 2018-07-09 VITALS — BP 116/70 | HR 82 | Temp 98.2°F | Ht 62.5 in | Wt 132.6 lb

## 2018-07-09 DIAGNOSIS — Z09 Encounter for follow-up examination after completed treatment for conditions other than malignant neoplasm: Secondary | ICD-10-CM

## 2018-07-09 DIAGNOSIS — J209 Acute bronchitis, unspecified: Secondary | ICD-10-CM | POA: Diagnosis not present

## 2018-07-09 MED ORDER — PREDNISONE 20 MG PO TABS: 20.0000 mg | ORAL_TABLET | Freq: Every day | ORAL | 0 refills | Status: DC

## 2018-07-09 MED ORDER — HYDROCODONE-HOMATROPINE 5-1.5 MG/5ML PO SYRP
5.0000 mL | ORAL_SOLUTION | Freq: Four times a day (QID) | ORAL | 0 refills | Status: DC | PRN
Start: 2018-07-09 — End: 2018-08-13

## 2018-07-09 MED ORDER — ALBUTEROL SULFATE HFA 108 (90 BASE) MCG/ACT IN AERS: 2.0000 | INHALATION_SPRAY | Freq: Four times a day (QID) | RESPIRATORY_TRACT | 2 refills | Status: DC | PRN

## 2018-07-09 NOTE — Progress Notes (Signed)
  Subjective:     Patient ID: Virginia Mcdonald , female    DOB: May 22, 1976 , 42 y.o.   MRN: [id number]   Chief Complaint  Patient presents with  . Follow-up    cough     HPI  Cough  This is a new problem. Episode onset: She was seen at Urgent Care on Oct 28th due to fever had bronchitis. Pertinent negatives include no fever, headaches, nasal congestion, rhinorrhea, sore throat or wheezing. Treatments tried: azithromycin currently. Her past medical history is significant for bronchitis. There is no history of COPD.     History reviewed. No pertinent past medical history.   History reviewed. No pertinent family history.   Current Outpatient Medications:  .  acetaminophen (TYLENOL) 500 MG tablet, Take 500 mg by mouth every 6 (six) hours as needed., Disp: , Rfl:  .  azithromycin (ZITHROMAX) 250 MG tablet, Take 2 initially then one daily for 4 days for infection, Disp: 5 tablet, Rfl: 0 .  CALCIUM PO, Take by mouth., Disp: , Rfl:  .  Cholecalciferol (VITAMIN D PO), Take by mouth., Disp: , Rfl:  .  fluticasone (FLONASE) 50 MCG/ACT nasal spray, Place 2 sprays into both nostrils daily., Disp: 16 g, Rfl: 0 .  ibuprofen (ADVIL,MOTRIN) 200 MG tablet, Take 200 mg by mouth every 6 (six) hours as needed for pain (pain)., Disp: , Rfl:  .  naproxen (NAPROSYN) 500 MG tablet, Take 1 tablet (500 mg total) by mouth 2 (two) times daily., Disp: 30 tablet, Rfl: 0   Allergies  Allergen Reactions  . Penicillins   . Procardia [Nifedipine] Palpitations     Review of Systems  Constitutional: Negative for fever.  HENT: Negative.  Negative for rhinorrhea and sore throat.   Respiratory: Positive for cough. Negative for wheezing.   Cardiovascular: Negative.   Musculoskeletal: Negative.   Neurological: Negative for dizziness and headaches.     Today's Vitals   07/09/18 0959  BP: 116/70  Pulse: 82  Temp: 98.2 F (36.8 C)  TempSrc: Oral  SpO2: 97%  Weight: 132 lb 9.6 oz (60.1 kg)  Height: 5' 2.5"  (1.588 m)   Body mass index is 23.87 kg/m.   Objective:  Physical Exam  Constitutional: She appears well-developed and well-nourished.  Cardiovascular: Normal rate, regular rhythm and normal heart sounds.  Pulmonary/Chest: Effort normal and breath sounds normal. No respiratory distress. She has no wheezes.  Congested cough noted  Skin: Skin is warm and dry.  Psychiatric: She has a normal mood and affect.        Assessment And Plan:     1. Acute bronchitis, unspecified organism  She is already being treated with azithromycin and is on the last day so I will not make a change to this medication.  Will treat with prednisone  She is leaving to go to [place] on Nov 10th - predniSONE (DELTASONE) 20 MG tablet; Take 1 tablet (20 mg total) by mouth daily with breakfast. Take 2 tablets once daily x 5 days.  Dispense: 10 tablet; Refill: 0 - albuterol (PROVENTIL HFA;VENTOLIN HFA) 108 (90 Base) MCG/ACT inhaler; Inhale 2 puffs into the lungs every 6 (six) hours as needed for wheezing or shortness of breath.  Dispense: 1 Inhaler; Refill: 2 - HYDROcodone-homatropine (HYDROMET) 5-1.5 MG/5ML syrup; Take 5 mLs by mouth every 6 (six) hours as needed for cough.  Dispense: 120 mL; Refill: 0   Minette Brine, FNP

## 2018-07-12 ENCOUNTER — Other Ambulatory Visit: Payer: Self-pay | Admitting: Nurse Practitioner

## 2018-07-12 DIAGNOSIS — J209 Acute bronchitis, unspecified: Secondary | ICD-10-CM

## 2018-07-12 MED ORDER — PREDNISONE 20 MG PO TABS: ORAL_TABLET | ORAL | 0 refills | Status: DC

## 2018-08-09 ENCOUNTER — Other Ambulatory Visit: Payer: Self-pay | Admitting: Internal Medicine

## 2018-08-09 ENCOUNTER — Encounter: Payer: BLUE CROSS/BLUE SHIELD | Attending: Internal Medicine | Admitting: Registered"

## 2018-08-09 DIAGNOSIS — E119 Type 2 diabetes mellitus without complications: Secondary | ICD-10-CM | POA: Insufficient documentation

## 2018-08-09 DIAGNOSIS — Z713 Dietary counseling and surveillance: Secondary | ICD-10-CM | POA: Diagnosis not present

## 2018-08-09 DIAGNOSIS — Z0000 Encounter for general adult medical examination without abnormal findings: Secondary | ICD-10-CM

## 2018-08-09 NOTE — Patient Instructions (Signed)
If interested, look into an app for you glucometer so your readings will sync to your phone. Accu-check If you notice that blood sugar number starts to rise it may be because the difference in what you were doing while in [place] and on vacation, especially all the walking you were doing. Aim to keep your weight stable, when cutting back carbs be sure to eat enough other foods to fuel your body.

## 2018-08-09 NOTE — Progress Notes (Signed)
Diabetes Self-Management Education  Visit Type: Follow-up  Appt. Start Time: 0830 Appt. End Time: 0900  08/09/2018  [name], identified by name and date of birth, is a 42 y.o. female with a diagnosis of Diabetes:  .   ASSESSMENT Patient states she has cut her carb intake (mostly rice, plantains) by 1/2, eating more beans and green vegetables.  Patient states when she was in [place] for 12 days she was walking a lot and ate similarly to how she eats at home, mostly vegetarian, fish, steamed vegetables, salad. Patient states her BG is usually 120-125 mg/dL both fasting and 2 hrs PPBG. Patient states she noticed that a few times it was high unexpectedly such as after eating salad, drinking water, and believes last month a heavy period affected her BG.  Pt states her dizziness has resolved after taking medication for fluid in her ear.   Pt states she is scheduled for her annual physical this week and believes she will be getting an updated A1c.   Diabetes Self-Management Education - 08/09/18 0956      Visit Information   Visit Type  Follow-up      Complications   Fasting Blood glucose range (mg/dL)  70-129   usually 120-125 occasionally higher   Postprandial Blood glucose range (mg/dL)  70-129   usually 120-125 occasionally higher   Number of hypoglycemic episodes per month  0    Number of hyperglycemic episodes per week  0      Dietary Intake   Breakfast  none or smoothie    Lunch  salads, fish, veggies    Snack (afternoon)  none    Dinner  soup with okra, spinach, eggplant, mushrooms, corn dough (sm piece)    Beverage(s)  water, coffee      Patient Education   Disease state   Factors that contribute to the development of diabetes    Physical activity and exercise   Role of exercise on diabetes management, blood pressure control and cardiac health.      Individualized Goals (developed by patient)   Nutrition  General guidelines for healthy choices and portions  discussed    Physical Activity  Exercise 3-5 times per week    Monitoring   test my blood glucose as discussed   dowload app     Patient Self-Evaluation of Goals - Patient rates self as meeting previously set goals (% of time)   Nutrition  >75%    Monitoring  >75%      Outcomes   Expected Outcomes  Demonstrated interest in learning. Expect positive outcomes    Future DMSE  4-6 wks    Program Status  Completed      Subsequent Visit   Since your last visit have you continued or begun to take your medications as prescribed?  Not on Medications    Since your last visit have you experienced any weight changes?  Loss    Weight Loss (lbs)  7    Since your last visit, are you checking your blood glucose at least once a day?  Yes      Individualized Plan for Diabetes Self-Management Training:   Learning Objective:  Patient will have a greater understanding of diabetes self-management. Patient education plan is to attend individual and/or group sessions per assessed needs and concerns.  Patient Instructions  If interested, look into an app for you glucometer so your readings will sync to your phone. Accu-check If you notice that blood sugar number starts  to rise it may be because the difference in what you were doing while in [place] and on vacation, especially all the walking you were doing. Aim to keep your weight stable, when cutting back carbs be sure to eat enough other foods to fuel your body.  Expected Outcomes:  Demonstrated interest in learning. Expect positive outcomes  Education material provided: instructions for downloading MySgr app  If problems or questions, patient to contact team via:  Phone and MyChart  Future DSME appointment: 4-6 wks

## 2018-08-10 ENCOUNTER — Encounter: Payer: Self-pay | Admitting: Internal Medicine

## 2018-08-10 ENCOUNTER — Other Ambulatory Visit: Payer: BLUE CROSS/BLUE SHIELD

## 2018-08-10 ENCOUNTER — Other Ambulatory Visit: Payer: Self-pay

## 2018-08-10 DIAGNOSIS — Z0000 Encounter for general adult medical examination without abnormal findings: Secondary | ICD-10-CM

## 2018-08-11 LAB — CBC
Hematocrit: 38.8 % (ref 34.0–46.6)
Hemoglobin: 12.6 g/dL (ref 11.1–15.9)
MCH: 28.8 pg (ref 26.6–33.0)
MCHC: 32.5 g/dL (ref 31.5–35.7)
MCV: 89 fL (ref 79–97)
Platelets: 174 10*3/uL (ref 150–450)
RBC: 4.38 x10E6/uL (ref 3.77–5.28)
RDW: 13.8 % (ref 12.3–15.4)
WBC: 6.2 10*3/uL (ref 3.4–10.8)

## 2018-08-11 LAB — CMP14+EGFR
ALT: 13 IU/L (ref 0–32)
AST: 16 IU/L (ref 0–40)
Albumin/Globulin Ratio: 1.5 (ref 1.2–2.2)
Albumin: 4.4 g/dL (ref 3.5–5.5)
Alkaline Phosphatase: 62 IU/L (ref 39–117)
BUN/Creatinine Ratio: 7 — ABNORMAL LOW (ref 9–23)
BUN: 6 mg/dL (ref 6–24)
Bilirubin Total: 0.2 mg/dL (ref 0.0–1.2)
CO2: 22 mmol/L (ref 20–29)
Calcium: 9.9 mg/dL (ref 8.7–10.2)
Chloride: 102 mmol/L (ref 96–106)
Creatinine, Ser: 0.92 mg/dL (ref 0.57–1.00)
GFR calc Af Amer: 89 mL/min/{1.73_m2} (ref >59)
GFR calc non Af Amer: 78 mL/min/{1.73_m2} (ref >59)
Globulin, Total: 3 g/dL (ref 1.5–4.5)
Glucose: 107 mg/dL — ABNORMAL HIGH (ref 65–99)
Potassium: 4.9 mmol/L (ref 3.5–5.2)
Sodium: 140 mmol/L (ref 134–144)
Total Protein: 7.4 g/dL (ref 6.0–8.5)

## 2018-08-11 LAB — LIPID PANEL
Chol/HDL Ratio: 3.5 ratio (ref 0.0–4.4)
Cholesterol, Total: 225 mg/dL — ABNORMAL HIGH (ref 100–199)
HDL: 65 mg/dL (ref >39)
LDL Calculated: 147 mg/dL — ABNORMAL HIGH (ref 0–99)
Triglycerides: 67 mg/dL (ref 0–149)
VLDL Cholesterol Cal: 13 mg/dL (ref 5–40)

## 2018-08-11 LAB — HEMOGLOBIN A1C
Est. average glucose Bld gHb Est-mCnc: 143 mg/dL
Hgb A1c MFr Bld: 6.6 % — ABNORMAL HIGH (ref 4.8–5.6)

## 2018-08-13 ENCOUNTER — Other Ambulatory Visit: Payer: Self-pay

## 2018-08-13 ENCOUNTER — Encounter: Payer: Self-pay | Admitting: Internal Medicine

## 2018-08-13 ENCOUNTER — Ambulatory Visit: Payer: BLUE CROSS/BLUE SHIELD | Admitting: Internal Medicine

## 2018-08-13 VITALS — BP 112/68 | HR 72 | Temp 98.0°F | Ht 62.25 in | Wt 132.8 lb

## 2018-08-13 DIAGNOSIS — Z0000 Encounter for general adult medical examination without abnormal findings: Secondary | ICD-10-CM

## 2018-08-13 DIAGNOSIS — E1165 Type 2 diabetes mellitus with hyperglycemia: Secondary | ICD-10-CM | POA: Diagnosis not present

## 2018-08-13 LAB — POCT URINALYSIS DIPSTICK
Bilirubin, UA: NEGATIVE
Glucose, UA: NEGATIVE
Ketones, UA: NEGATIVE
Leukocytes, UA: NEGATIVE
Nitrite, UA: NEGATIVE
Protein, UA: NEGATIVE
Spec Grav, UA: 1.02 (ref 1.010–1.025)
Urobilinogen, UA: 0.2 E.U./dL
pH, UA: 5.5 (ref 5.0–8.0)

## 2018-08-13 LAB — POCT UA - MICROALBUMIN
Albumin/Creatinine Ratio, Urine, POC: <30
Creatinine, POC: 200 mg/dL
Microalbumin Ur, POC: 10 mg/L

## 2018-08-13 MED ORDER — ACCU-CHEK FASTCLIX LANCETS MISC: 11 refills | Status: DC

## 2018-08-13 MED ORDER — GLUCOSE BLOOD VI STRP: ORAL_STRIP | 11 refills | Status: DC

## 2018-08-13 NOTE — Patient Instructions (Signed)

## 2018-08-14 ENCOUNTER — Encounter: Payer: Self-pay | Admitting: Internal Medicine

## 2018-08-15 ENCOUNTER — Encounter: Payer: Self-pay | Admitting: Internal Medicine

## 2018-08-15 NOTE — Progress Notes (Signed)
Subjective:     Patient ID: Virginia Mcdonald , female    DOB: 12-Nov-1975 , 42 y.o.   MRN: [id number]   Chief Complaint  Patient presents with  . Annual Exam    HPI  She is here today for a full physical exam. She is followed by Dr. Leo Grosser for her GYN exams     Past Medical History:  Diagnosis Date  . Diet-controlled diabetes mellitus ([place])      Family History  Problem Relation Age of Onset  . Diabetes Mother   . Hypertension Mother   . Hypertension Father   . Hypertension Sister   . Diabetes Sister   . Healthy Brother   . Healthy Daughter   . Healthy Brother   . Healthy Brother   . Healthy Daughter   . Healthy Daughter      Current Outpatient Medications:  .  CALCIUM PO, Take by mouth., Disp: , Rfl:  .  Cholecalciferol (VITAMIN D PO), Take by mouth., Disp: , Rfl:  .  ferrous sulfate 325 (65 FE) MG tablet, Take 325 mg by mouth daily with breakfast., Disp: , Rfl:  .  ACCU-CHEK FASTCLIX LANCETS MISC, Use as directed to check blood sugars 1 time per day dx: e11.65, Disp: 50 each, Rfl: 11 .  glucose blood (ACCU-CHEK GUIDE) test strip, Use as instructed to check blood sugars 1 time per day dx: e11.65, Disp: 50 each, Rfl: 11   Allergies  Allergen Reactions  . Penicillins   . Procardia [Nifedipine] Palpitations      Patient's last menstrual period was 07/27/2018. Negative for: breast discharge, breast lump(s), breast pain and breast self exam. Associated symptoms include abnormal vaginal bleeding. Pertinent negatives include abnormal bleeding (hematology), anxiety, decreased libido, depression, difficulty falling sleep, dyspareunia, history of infertility, nocturia, sexual dysfunction, sleep disturbances, urinary incontinence, urinary urgency, vaginal discharge and vaginal itching. Diet regular.The patient states her exercise level is    . The patient's tobacco use is:  Social History   Tobacco Use  Smoking Status Never Smoker  Smokeless Tobacco Never Used  . She has  been exposed to passive smoke. The patient's alcohol use is:  Social History   Substance and Sexual Activity  Alcohol Use No  . Review of Systems  Constitutional: Negative.   HENT: Negative.   Eyes: Negative.   Respiratory: Negative.   Cardiovascular: Negative.   Gastrointestinal: Negative.   Endocrine: Negative.   Genitourinary: Negative.   Musculoskeletal: Negative.   Skin: Negative.   Allergic/Immunologic: Negative.   Neurological: Negative.   Hematological: Negative.   Psychiatric/Behavioral: Negative.      Today's Vitals   08/13/18 1438  BP: 112/68  Pulse: 72  Temp: 98 F (36.7 C)  TempSrc: Oral  Weight: 132 lb 12.8 oz (60.2 kg)  Height: 5' 2.25" (1.581 m)   Body mass index is 24.09 kg/m.   Objective:  Physical Exam  Constitutional: She is oriented to person, place, and time. She appears well-developed and well-nourished.  HENT:  Head: Normocephalic and atraumatic.  Right Ear: External ear normal.  Left Ear: External ear normal.  Nose: Nose normal.  Mouth/Throat: Oropharynx is clear and moist. No oropharyngeal exudate.  Eyes: Pupils are equal, round, and reactive to light. Conjunctivae and EOM are normal.  Neck: Normal range of motion. Neck supple.  Cardiovascular: Normal rate, regular rhythm, normal heart sounds and intact distal pulses.  Pulses:      Dorsalis pedis pulses are 3+ on the right side, and 3+  on the left side.  Pulmonary/Chest: Effort normal and breath sounds normal.  Abdominal: Soft. Bowel sounds are normal.  Genitourinary:  Genitourinary Comments: deferred  Musculoskeletal: Normal range of motion.       Right foot: There is normal range of motion and no deformity.       Left foot: There is normal range of motion and no deformity.  Feet:  Right Foot:  Protective Sensation: 5 sites tested. 5 sites sensed.  Left Foot:  Protective Sensation: 5 sites tested. 5 sites sensed.  Neurological: She is alert and oriented to person, place, and  time.  Skin: Skin is warm and dry.  Psychiatric: She has a normal mood and affect.  Nursing note and vitals reviewed.       Assessment And Plan:     1. Routine general medical examination at health care facility  A full exam was performed. Importance of monthly self breast exams was discussed with the patient.  PATIENT HAS BEEN ADVISED TO GET 30-45 MINUTES REGULAR EXERCISE NO LESS THAN FOUR TO FIVE DAYS PER WEEK - BOTH WEIGHTBEARING EXERCISES AND AEROBIC ARE RECOMMENDED.  SHE IS ADVISED TO FOLLOW A HEALTHY DIET WITH AT LEAST SIX FRUITS/VEGGIES PER DAY, DECREASE INTAKE OF RED MEAT, AND TO INCREASE FISH INTAKE TO TWO DAYS PER WEEK.  MEATS/FISH SHOULD NOT BE FRIED, BAKED OR BROILED IS PREFERABLE.  I SUGGEST WEARING SPF 50 SUNSCREEN ON EXPOSED PARTS AND ESPECIALLY WHEN IN THE DIRECT SUNLIGHT FOR AN EXTENDED PERIOD OF TIME.  PLEASE AVOID FAST FOOD RESTAURANTS AND INCREASE YOUR WATER INTAKE.   2. Uncontrolled type 2 diabetes mellitus with hyperglycemia (HCC)  Diabetic foot exam was performed.  I DISCUSSED WITH THE PATIENT AT LENGTH REGARDING THE GOALS OF GLYCEMIC CONTROL AND POSSIBLE LONG-TERM COMPLICATIONS.  I  ALSO STRESSED THE IMPORTANCE OF COMPLIANCE WITH HOME GLUCOSE MONITORING, DIETARY RESTRICTIONS INCLUDING AVOIDANCE OF SUGARY DRINKS/PROCESSED FOODS,  ALONG WITH REGULAR EXERCISE.  I  ALSO STRESSED THE IMPORTANCE OF ANNUAL EYE EXAMS, SELF FOOT CARE AND COMPLIANCE WITH OFFICE VISITS.  - POCT Urinalysis Dipstick (25852) - POCT UA - Microalbumin        Maximino Greenland, MD

## 2018-08-17 ENCOUNTER — Encounter: Payer: Self-pay | Admitting: Internal Medicine

## 2018-08-24 ENCOUNTER — Encounter: Payer: Self-pay | Admitting: Internal Medicine

## 2018-09-06 ENCOUNTER — Ambulatory Visit: Payer: BLUE CROSS/BLUE SHIELD | Admitting: Registered"

## 2018-11-08 ENCOUNTER — Encounter: Payer: Self-pay | Admitting: Internal Medicine

## 2018-11-08 ENCOUNTER — Other Ambulatory Visit: Payer: BLUE CROSS/BLUE SHIELD

## 2018-11-08 DIAGNOSIS — E1165 Type 2 diabetes mellitus with hyperglycemia: Secondary | ICD-10-CM

## 2018-11-09 LAB — CMP14+EGFR
ALT: 14 IU/L (ref 0–32)
AST: 15 IU/L (ref 0–40)
Albumin/Globulin Ratio: 1.2 (ref 1.2–2.2)
Albumin: 4.2 g/dL (ref 3.8–4.8)
Alkaline Phosphatase: 71 IU/L (ref 39–117)
BUN/Creatinine Ratio: 9 (ref 9–23)
BUN: 9 mg/dL (ref 6–24)
Bilirubin Total: 0.2 mg/dL (ref 0.0–1.2)
CO2: 25 mmol/L (ref 20–29)
Calcium: 9.6 mg/dL (ref 8.7–10.2)
Chloride: 101 mmol/L (ref 96–106)
Creatinine, Ser: 0.96 mg/dL (ref 0.57–1.00)
GFR calc Af Amer: 85 mL/min/{1.73_m2} (ref >59)
GFR calc non Af Amer: 74 mL/min/{1.73_m2} (ref >59)
Globulin, Total: 3.5 g/dL (ref 1.5–4.5)
Glucose: 113 mg/dL — ABNORMAL HIGH (ref 65–99)
Potassium: 4.7 mmol/L (ref 3.5–5.2)
Sodium: 139 mmol/L (ref 134–144)
Total Protein: 7.7 g/dL (ref 6.0–8.5)

## 2018-11-09 LAB — HEMOGLOBIN A1C
Est. average glucose Bld gHb Est-mCnc: 140 mg/dL
Hgb A1c MFr Bld: 6.5 % — ABNORMAL HIGH (ref 4.8–5.6)

## 2018-11-15 ENCOUNTER — Encounter: Payer: Self-pay | Admitting: Internal Medicine

## 2018-11-15 ENCOUNTER — Ambulatory Visit: Payer: BLUE CROSS/BLUE SHIELD | Admitting: Internal Medicine

## 2018-11-15 VITALS — BP 110/72 | HR 74 | Temp 98.0°F | Ht 62.4 in | Wt 135.0 lb

## 2018-11-15 DIAGNOSIS — K5909 Other constipation: Secondary | ICD-10-CM

## 2018-11-15 DIAGNOSIS — E1165 Type 2 diabetes mellitus with hyperglycemia: Secondary | ICD-10-CM | POA: Diagnosis not present

## 2018-11-15 NOTE — Patient Instructions (Signed)
START GLUCOSE SUPPLEMENT ONE CAPSULE TWICE DAILY  CONTINUE WITH FIBER SUPPLEMENT  START LINZESS 72MG  ONE CAPSULE UPON AWAKENING FOR CONSTIPATION  WAIT 1 MIN TO AN HOUR PRIOR TO EATING BREAKFAST      Diabetes Mellitus and Nutrition, Adult When you have diabetes (diabetes mellitus), it is very important to have healthy eating habits because your blood sugar (glucose) levels are greatly affected by what you eat and drink. Eating healthy foods in the appropriate amounts, at about the same times every day, can help you:  Control your blood glucose.  Lower your risk of heart disease.  Improve your blood pressure.  Reach or maintain a healthy weight. Every person with diabetes is different, and each person has different needs for a meal plan. Your health care provider may recommend that you work with a diet and nutrition specialist (dietitian) to make a meal plan that is best for you. Your meal plan may vary depending on factors such as:  The calories you need.  The medicines you take.  Your weight.  Your blood glucose, blood pressure, and cholesterol levels.  Your activity level.  Other health conditions you have, such as heart or kidney disease. How do carbohydrates affect me? Carbohydrates, also called carbs, affect your blood glucose level more than any other type of food. Eating carbs naturally raises the amount of glucose in your blood. Carb counting is a method for keeping track of how many carbs you eat. Counting carbs is important to keep your blood glucose at a healthy level, especially if you use insulin or take certain oral diabetes medicines. It is important to know how many carbs you can safely have in each meal. This is different for every person. Your dietitian can help you calculate how many carbs you should have at each meal and for each snack. Foods that contain carbs include:  Bread, cereal, rice, pasta, and crackers.  Potatoes and corn.  Peas, beans, and  lentils.  Milk and yogurt.  Fruit and juice.  Desserts, such as cakes, cookies, ice cream, and candy. How does alcohol affect me? Alcohol can cause a sudden decrease in blood glucose (hypoglycemia), especially if you use insulin or take certain oral diabetes medicines. Hypoglycemia can be a life-threatening condition. Symptoms of hypoglycemia (sleepiness, dizziness, and confusion) are similar to symptoms of having too much alcohol. If your health care provider says that alcohol is safe for you, follow these guidelines:  Limit alcohol intake to no more than 1 drink per day for nonpregnant women and 2 drinks per day for men. One drink equals 12 oz of beer, 5 oz of wine, or 1 oz of hard liquor.  Do not drink on an empty stomach.  Keep yourself hydrated with water, diet soda, or unsweetened iced tea.  Keep in mind that regular soda, juice, and other mixers may contain a lot of sugar and must be counted as carbs. What are tips for following this plan?  Reading food labels  Start by checking the serving size on the "Nutrition Facts" label of packaged foods and drinks. The amount of calories, carbs, fats, and other nutrients listed on the label is based on one serving of the item. Many items contain more than one serving per package.  Check the total grams (g) of carbs in one serving. You can calculate the number of servings of carbs in one serving by dividing the total carbs by 15. For example, if a food has 30 g of total carbs, it would  be equal to 2 servings of carbs.  Check the number of grams (g) of saturated and trans fats in one serving. Choose foods that have low or no amount of these fats.  Check the number of milligrams (mg) of salt (sodium) in one serving. Most people should limit total sodium intake to less than 2,300 mg per day.  Always check the nutrition information of foods labeled as "low-fat" or "nonfat". These foods may be higher in added sugar or refined carbs and should  be avoided.  Talk to your dietitian to identify your daily goals for nutrients listed on the label. Shopping  Avoid buying canned, premade, or processed foods. These foods tend to be high in fat, sodium, and added sugar.  Shop around the outside edge of the grocery store. This includes fresh fruits and vegetables, bulk grains, fresh meats, and fresh dairy. Cooking  Use low-heat cooking methods, such as baking, instead of high-heat cooking methods like deep frying.  Cook using healthy oils, such as olive, canola, or sunflower oil.  Avoid cooking with butter, cream, or high-fat meats. Meal planning  Eat meals and snacks regularly, preferably at the same times every day. Avoid going long periods of time without eating.  Eat foods high in fiber, such as fresh fruits, vegetables, beans, and whole grains. Talk to your dietitian about how many servings of carbs you can eat at each meal.  Eat 4-6 ounces (oz) of lean protein each day, such as lean meat, chicken, fish, eggs, or tofu. One oz of lean protein is equal to: ? 1 oz of meat, chicken, or fish. ? 1 egg. ?  cup of tofu.  Eat some foods each day that contain healthy fats, such as avocado, nuts, seeds, and fish. Lifestyle  Check your blood glucose regularly.  Exercise regularly as told by your health care provider. This may include: ? 150 minutes of moderate-intensity or vigorous-intensity exercise each week. This could be brisk walking, biking, or water aerobics. ? Stretching and doing strength exercises, such as yoga or weightlifting, at least 2 times a week.  Take medicines as told by your health care provider.  Do not use any products that contain nicotine or tobacco, such as cigarettes and e-cigarettes. If you need help quitting, ask your health care provider.  Work with a Social worker or diabetes educator to identify strategies to manage stress and any emotional and social challenges. Questions to ask a health care  provider  Do I need to meet with a diabetes educator?  Do I need to meet with a dietitian?  What number can I call if I have questions?  When are the best times to check my blood glucose? Where to find more information:  American Diabetes Association: diabetes.org  Academy of Nutrition and Dietetics: www.eatright.[employer] of Diabetes and Digestive and Kidney Diseases (NIH): [employer] Summary  A healthy meal plan will help you control your blood glucose and maintain a healthy lifestyle.  Working with a diet and nutrition specialist (dietitian) can help you make a meal plan that is best for you.  Keep in mind that carbohydrates (carbs) and alcohol have immediate effects on your blood glucose levels. It is important to count carbs and to use alcohol carefully. This information is not intended to replace advice given to you by your health care provider. Make sure you discuss any questions you have with your health care provider. Document Released: 05/22/2005 Document Revised: 03/25/2017 Document Reviewed: 09/29/2016 Elsevier Interactive Patient Education  2019 [place].

## 2018-12-05 NOTE — Progress Notes (Signed)
Subjective:     Patient ID: Virginia Mcdonald , female    DOB: 10/14/1975 , 43 y.o.   MRN: [id number]   Chief Complaint  Patient presents with  . Diabetes    HPI  Diabetes  She presents for her follow-up diabetic visit. She has type 2 diabetes mellitus. There are no hypoglycemic associated symptoms. Pertinent negatives for diabetes include no blurred vision and no chest pain. There are no hypoglycemic complications.     Past Medical History:  Diagnosis Date  . Diet-controlled diabetes mellitus ([place])      Family History  Problem Relation Age of Onset  . Diabetes Mother   . Hypertension Mother   . Hypertension Father   . Hypertension Sister   . Diabetes Sister   . Healthy Brother   . Healthy Daughter   . Healthy Brother   . Healthy Brother   . Healthy Daughter   . Healthy Daughter      Current Outpatient Medications:  .  ACCU-CHEK FASTCLIX LANCETS MISC, Use as directed to check blood sugars 1 time per day dx: e11.65, Disp: 50 each, Rfl: 11 .  CALCIUM PO, Take by mouth., Disp: , Rfl:  .  glucose blood (ACCU-CHEK GUIDE) test strip, Use as instructed to check blood sugars 1 time per day dx: e11.65, Disp: 50 each, Rfl: 11 .  Prenatal Vit-Fe Fumarate-FA (PRENATAL PO), Take by mouth., Disp: , Rfl:  .  Cholecalciferol (VITAMIN D PO), Take by mouth., Disp: , Rfl:  .  ferrous sulfate 325 (65 FE) MG tablet, Take 325 mg by mouth daily with breakfast., Disp: , Rfl:    Allergies  Allergen Reactions  . Penicillins   . Procardia [Nifedipine] Palpitations     Review of Systems  Constitutional: Negative.   Eyes: Negative for blurred vision.  Respiratory: Negative.   Cardiovascular: Negative.  Negative for chest pain.  Gastrointestinal: Negative.   Neurological: Negative.   Psychiatric/Behavioral: Negative.      Today's Vitals   11/15/18 1113  BP: 110/72  Pulse: 74  Temp: 98 F (36.7 C)  TempSrc: Oral  Weight: 135 lb (61.2 kg)  Height: 5' 2.4" (1.585 m)   Body mass  index is 24.38 kg/m.   Objective:  Physical Exam Vitals signs and nursing note reviewed.  Constitutional:      Appearance: Normal appearance.  HENT:     Head: Normocephalic and atraumatic.  Cardiovascular:     Rate and Rhythm: Normal rate and regular rhythm.     Heart sounds: Normal heart sounds.  Pulmonary:     Effort: Pulmonary effort is normal.     Breath sounds: Normal breath sounds.  Skin:    General: Skin is warm.  Neurological:     General: No focal deficit present.     Mental Status: She is alert.  Psychiatric:        Mood and Affect: Mood normal.        Behavior: Behavior normal.         Assessment And Plan:     1. Uncontrolled type 2 diabetes mellitus with hyperglycemia ([place])  'I spent more than 25 minutes with both the patient and her daughter. I answered their questions regarding diabetes and her nutrition evaluation. Importance of regular exercise and dietary compliance was discussed with the patient. She wishes to continue with supplements to treat this condition for now. Pt advised if her a1c goes above 7.0, she will need to start rx medication. She was also instructed  on how to check her blood sugars. She was given a glucometer for home use. Greater than 50% of face to face visit was spent in counseling and coordination of care. More than 25 minutes were spent with the patient.   2. Chronic constipation  She was given samples of Linzess 72mg  to take once daily. She was advised to take once daily upon awakening and to wait an hour prior to eating breakfast. She will let me know if her symptoms persist. She will rto in six weeks for re-evaluation.   Maximino Greenland, MD

## 2018-12-09 ENCOUNTER — Encounter: Payer: Self-pay | Admitting: Internal Medicine

## 2018-12-10 ENCOUNTER — Other Ambulatory Visit: Payer: Self-pay

## 2019-01-27 ENCOUNTER — Encounter: Payer: Self-pay | Admitting: Internal Medicine

## 2019-02-08 ENCOUNTER — Other Ambulatory Visit: Payer: Self-pay

## 2019-02-08 ENCOUNTER — Other Ambulatory Visit: Payer: BLUE CROSS/BLUE SHIELD

## 2019-02-08 DIAGNOSIS — E1165 Type 2 diabetes mellitus with hyperglycemia: Secondary | ICD-10-CM

## 2019-02-08 LAB — BMP8+EGFR
BUN/Creatinine Ratio: 11 (ref 9–23)
BUN: 10 mg/dL (ref 6–24)
CO2: 21 mmol/L (ref 20–29)
Calcium: 10 mg/dL (ref 8.7–10.2)
Chloride: 100 mmol/L (ref 96–106)
Creatinine, Ser: 0.95 mg/dL (ref 0.57–1.00)
GFR calc Af Amer: 86 mL/min/{1.73_m2} (ref >59)
GFR calc non Af Amer: 75 mL/min/{1.73_m2} (ref >59)
Glucose: 143 mg/dL — ABNORMAL HIGH (ref 65–99)
Potassium: 4.6 mmol/L (ref 3.5–5.2)
Sodium: 137 mmol/L (ref 134–144)

## 2019-02-08 LAB — HEMOGLOBIN A1C
Est. average glucose Bld gHb Est-mCnc: 134 mg/dL
Hgb A1c MFr Bld: 6.3 % — ABNORMAL HIGH (ref 4.8–5.6)

## 2019-02-15 ENCOUNTER — Other Ambulatory Visit: Payer: Self-pay

## 2019-02-15 ENCOUNTER — Encounter: Payer: Self-pay | Admitting: Internal Medicine

## 2019-02-15 ENCOUNTER — Ambulatory Visit (INDEPENDENT_AMBULATORY_CARE_PROVIDER_SITE_OTHER): Payer: Self-pay | Admitting: Internal Medicine

## 2019-02-15 VITALS — BP 114/66 | HR 88 | Temp 98.9°F | Ht 62.4 in | Wt 135.2 lb

## 2019-02-15 DIAGNOSIS — E1165 Type 2 diabetes mellitus with hyperglycemia: Secondary | ICD-10-CM

## 2019-02-15 DIAGNOSIS — R0789 Other chest pain: Secondary | ICD-10-CM

## 2019-02-15 NOTE — Progress Notes (Signed)
Subjective:     Patient ID: Virginia Mcdonald , female    DOB: 07-05-1976 , 43 y.o.   MRN: [id number]   Chief Complaint  Patient presents with  . Diabetes    HPI  She is here today for f/u diabetes. She is not taking any rx medication for her diabetes. She has chosen to take supplements, Gluco-trol one capsules twice daily. She has not had any issues with the supplements. She adds that she has changed her diet as well. She has no new concerns.     Past Medical History:  Diagnosis Date  . Diet-controlled diabetes mellitus ([place])      Family History  Problem Relation Age of Onset  . Diabetes Mother   . Hypertension Mother   . Hypertension Father   . Hypertension Sister   . Diabetes Sister   . Healthy Brother   . Healthy Daughter   . Healthy Brother   . Healthy Brother   . Healthy Daughter   . Healthy Daughter      Current Outpatient Medications:  .  ACCU-CHEK FASTCLIX LANCETS MISC, Use as directed to check blood sugars 1 time per day dx: e11.65, Disp: 50 each, Rfl: 11 .  CALCIUM PO, Take by mouth. Herbal life, Disp: , Rfl:  .  Cholecalciferol (VITAMIN D PO), Take 2,000 Units by mouth. , Disp: , Rfl:  .  glucose blood (ACCU-CHEK GUIDE) test strip, Use as instructed to check blood sugars 1 time per day dx: e11.65, Disp: 50 each, Rfl: 11 .  ferrous sulfate 325 (65 FE) MG tablet, Take 325 mg by mouth daily with breakfast., Disp: , Rfl:  .  polyethylene glycol powder (MIRALAX) powder, Take 1 Container by mouth once., Disp: , Rfl:  .  Prenatal Vit-Fe Fumarate-FA (PRENATAL PO), Take by mouth., Disp: , Rfl:    Allergies  Allergen Reactions  . Penicillins   . Procardia [Nifedipine] Palpitations     Review of Systems  Constitutional: Negative.   Respiratory: Negative.   Cardiovascular: Positive for chest pain.       She admits to having some chest pain. She thinks it is due to muscle strain, she has been doing pushups. Pain now gone. started 4 days ago  Gastrointestinal:  Negative.   Neurological: Negative.   Psychiatric/Behavioral: Negative.      Today's Vitals   02/15/19 0923  BP: 114/66  Pulse: 88  Temp: 98.9 F (37.2 C)  TempSrc: Oral  Weight: 135 lb 3.2 oz (61.3 kg)  Height: 5' 2.4" (1.585 m)   Body mass index is 24.41 kg/m.   Objective:  Physical Exam Vitals signs and nursing note reviewed.  Constitutional:      Appearance: Normal appearance.  HENT:     Head: Normocephalic and atraumatic.  Cardiovascular:     Rate and Rhythm: Normal rate and regular rhythm.     Heart sounds: Normal heart sounds.  Pulmonary:     Effort: Pulmonary effort is normal.     Breath sounds: Normal breath sounds.  Skin:    General: Skin is warm.  Neurological:     General: No focal deficit present.     Mental Status: She is alert.  Psychiatric:        Mood and Affect: Mood normal.        Behavior: Behavior normal.         Assessment And Plan:     1. Uncontrolled type 2 diabetes mellitus with hyperglycemia ([place])  She had labs  drawn a week ago. We went over her results in full details.   2. Muscular chest pain  Resolving. She was given several upper body stretches to perform daily.   Maximino Greenland, MD    THE PATIENT IS ENCOURAGED TO PRACTICE SOCIAL DISTANCING DUE TO THE COVID-19 PANDEMIC.

## 2019-02-15 NOTE — Patient Instructions (Signed)
Muscle Strain A muscle strain is an injury that happens when a muscle is stretched longer than normal. This can happen during a fall, sports, or lifting. This can tear some muscle fibers. Usually, recovery from muscle strain takes 1-2 weeks. Complete healing normally takes 5-6 weeks. This condition is first treated with PRICE therapy. This involves:  Protecting your muscle from being injured again.  Resting your injured muscle.  Icing your injured muscle.  Applying pressure (compression) to your injured muscle. This may be done with a splint or elastic bandage.  Raising (elevating) your injured muscle. Your doctor may also recommend medicine for pain. Follow these instructions at home: If you have a splint:  Wear the splint as told by your doctor. Take it off only as told by your doctor.  Loosen the splint if your fingers or toes tingle, get numb, or turn cold and blue.  Keep the splint clean.  If the splint is not waterproof: ? Do not let it get wet. ? Cover it with a watertight covering when you take a bath or a shower. Managing pain, stiffness, and swelling   If directed, put ice on your injured area. ? If you have a removable splint, take it off as told by your doctor. ? Put ice in a plastic bag. ? Place a towel between your skin and the bag. ? Leave the ice on for 20 minutes, 2-3 times a day.  Move your fingers or toes often. This helps to avoid stiffness and lessen swelling.  Raise your injured area above the level of your heart while you are sitting or lying down.  Wear an elastic bandage as told by your doctor. Make sure it is not too tight. General instructions  Take over-the-counter and prescription medicines only as told by your doctor.  Limit your activity. Rest your injured muscle as told by your doctor. Your doctor may say that gentle movements are okay.  If physical therapy was prescribed, do exercises as told by your doctor.  Do not put pressure on any  part of the splint until it is fully hardened. This may take many hours.  Do not use any products that contain nicotine or tobacco, such as cigarettes and e-cigarettes. These can delay bone healing. If you need help quitting, ask your doctor.  Warm up before you exercise. This helps to prevent more muscle strains.  Ask your doctor when it is safe to drive if you have a splint.  Keep all follow-up visits as told by your doctor. This is important. Contact a doctor if:  You have more pain or swelling in your injured area. Get help right away if:  You have any of these problems in your injured area: ? You have numbness. ? You have tingling. ? You lose a lot of strength. Summary  A muscle strain is an injury that happens when a muscle is stretched longer than normal.  This condition is first treated with PRICE therapy. This includes protecting, resting, icing, adding pressure, and raising your injury.  Limit your activity. Rest your injured muscle as told by your doctor. Your doctor may say that gentle movements are okay.  Warm up before you exercise. This helps to prevent more muscle strains. This information is not intended to replace advice given to you by your health care provider. Make sure you discuss any questions you have with your health care provider. Document Released: 06/03/2008 Document Revised: 10/01/2016 Document Reviewed: 10/01/2016 Elsevier Interactive Patient Education  2019 Elsevier   Inc.  

## 2019-08-10 ENCOUNTER — Other Ambulatory Visit: Payer: Self-pay

## 2019-08-10 DIAGNOSIS — Z20822 Contact with and (suspected) exposure to covid-19: Secondary | ICD-10-CM

## 2019-08-12 LAB — NOVEL CORONAVIRUS, NAA: SARS-CoV-2, NAA: NOT DETECTED

## 2019-08-22 ENCOUNTER — Encounter: Payer: BLUE CROSS/BLUE SHIELD | Admitting: Internal Medicine

## 2019-08-29 ENCOUNTER — Encounter: Payer: Self-pay | Admitting: Internal Medicine

## 2019-08-30 ENCOUNTER — Other Ambulatory Visit: Payer: Self-pay

## 2019-08-30 ENCOUNTER — Encounter: Payer: Self-pay | Admitting: Internal Medicine

## 2019-08-30 ENCOUNTER — Telehealth: Payer: BLUE CROSS/BLUE SHIELD | Admitting: Internal Medicine

## 2019-08-30 VITALS — BP 117/72 | HR 82 | Wt 140.0 lb

## 2019-08-30 DIAGNOSIS — H9202 Otalgia, left ear: Secondary | ICD-10-CM

## 2019-08-30 DIAGNOSIS — J0100 Acute maxillary sinusitis, unspecified: Secondary | ICD-10-CM

## 2019-08-30 MED ORDER — DOXYCYCLINE HYCLATE 100 MG PO CAPS: 100.0000 mg | ORAL_CAPSULE | Freq: Two times a day (BID) | ORAL | 0 refills | Status: DC

## 2019-09-04 ENCOUNTER — Encounter: Payer: Self-pay | Admitting: Internal Medicine

## 2019-09-04 NOTE — Progress Notes (Signed)
Virtual Visit via Video   This visit type was conducted due to national recommendations for restrictions regarding the COVID-19 Pandemic (e.g. social distancing) in an effort to limit this patient's exposure and mitigate transmission in our community.  Due to her co-morbid illnesses, this patient is at least at moderate risk for complications without adequate follow up.  This format is felt to be most appropriate for this patient at this time.  All issues noted in this document were discussed and addressed.  A limited physical exam was performed with this format.    This visit type was conducted due to national recommendations for restrictions regarding the COVID-19 Pandemic (e.g. social distancing) in an effort to limit this patient's exposure and mitigate transmission in our community.  Patients identity confirmed using two different identifiers.  This format is felt to be most appropriate for this patient at this time.  All issues noted in this document were discussed and addressed.  No physical exam was performed (except for noted visual exam findings with Video Visits).    Date:  09/04/2019   ID:  Hartford Poli, DOB 1976-07-03, MRN [id number]  Patient Location:  Home  Provider location:   Office    Chief Complaint:  "I think I have sinus infection"  History of Present Illness:    Virginia Mcdonald is a 43 y.o. female who presents via video conferencing for a telehealth visit today.    The patient does have symptoms concerning for COVID-19 infection (fever, chills, cough, or new shortness of breath).   She presents today for virtual visit. She prefers this method of contact due to COVID-19 pandemic.  She presents today for evaluation of possible sinus infection. She reports she started to have symptoms about 3 weeks ago. She has already had negative COVID test. She initially lost her taste, but it has since returned. She has sinus congestion and ear pain. She denies having a  fever. She does not have her sense of smell at this time. She denies known contact with any persons positive for COVID.   Sinus Problem This is a new problem. The current episode started 1 to 4 weeks ago. The problem is unchanged. There has been no fever. Associated symptoms include congestion, ear pain and sinus pressure. Pertinent negatives include no chills or coughing. Past treatments include acetaminophen. The treatment provided mild relief.     Past Medical History:  Diagnosis Date  . Diet-controlled diabetes mellitus ([place])    Past Surgical History:  Procedure Laterality Date  . CESAREAN SECTION       Current Meds  Medication Sig  . ACCU-CHEK FASTCLIX LANCETS MISC Use as directed to check blood sugars 1 time per day dx: e11.65  . CALCIUM PO Take by mouth. Herbal life  . Cholecalciferol (VITAMIN D PO) Take 2,000 Units by mouth.   . ferrous sulfate 325 (65 FE) MG tablet Take 325 mg by mouth as needed.   Marland Kitchen glucose blood (ACCU-CHEK GUIDE) test strip Use as instructed to check blood sugars 1 time per day dx: e11.65  . polyethylene glycol powder (MIRALAX) powder Take 1 Container by mouth as needed.   . Prenatal Vit-Fe Fumarate-FA (PRENATAL PO) Take by mouth.     Allergies:   Penicillins and Procardia [nifedipine]   Social History   Tobacco Use  . Smoking status: Never Smoker  . Smokeless tobacco: Never Used  Substance Use Topics  . Alcohol use: No  . Drug use: No  Family Hx: The patient's family history includes Diabetes in her mother and sister; Healthy in her brother, brother, brother, daughter, daughter, and daughter; Hypertension in her father, mother, and sister.  ROS:   Please see the history of present illness.    Review of Systems  Constitutional: Negative.  Negative for chills.  HENT: Positive for congestion, ear pain and sinus pressure.   Respiratory: Negative.  Negative for cough.   Cardiovascular: Negative.   Gastrointestinal: Negative.   Neurological:  Negative.   Psychiatric/Behavioral: Negative.     All other systems reviewed and are negative.   Labs/Other Tests and Data Reviewed:    Recent Labs: 11/08/2018: ALT 14 02/08/2019: BUN 10; Creatinine, Ser 0.95; Potassium 4.6; Sodium 137   Recent Lipid Panel Lab Results  Component Value Date/Time   CHOL 225 (H) 08/10/2018 08:52 AM   TRIG 67 08/10/2018 08:52 AM   HDL 65 08/10/2018 08:52 AM   CHOLHDL 3.5 08/10/2018 08:52 AM   LDLCALC 147 (H) 08/10/2018 08:52 AM    Wt Readings from Last 3 Encounters:  08/30/19 140 lb (63.5 kg)  02/15/19 135 lb 3.2 oz (61.3 kg)  11/15/18 135 lb (61.2 kg)     Exam:    Vital Signs:  BP 117/72 (BP Location: Left Arm, Patient Position: Sitting, Cuff Size: Small)   Pulse 82   Wt 140 lb (63.5 kg)   LMP 08/26/2019 (Approximate)   BMI 25.28 kg/m     Physical Exam  Constitutional: She is oriented to person, place, and time and well-developed, well-nourished, and in no distress.  Looks ill  HENT:  Head: Normocephalic and atraumatic.  Sounds a little congested  Pulmonary/Chest: Effort normal.  She is able to speak in full sentences.   Musculoskeletal:     Cervical back: Normal range of motion.  Neurological: She is alert and oriented to person, place, and time.  Psychiatric: Affect normal.  Nursing note and vitals reviewed.   ASSESSMENT & PLAN:     1. Acute non-recurrent maxillary sinusitis  She was given rx doxycycline 100mg  taken twice daily. She is encouraged to take full antibiotic course. Again, she had neg COVID test on 12/2. I suspect she may have had testing performed too soon. She is encouraged to go to ER should she develop worsening SOB. She should check temps daily to document any fevers.   2. Otalgia of left ear  Unable to examine her ears due to nature of visit. Abx should cover if infection is present. Again, encouraged to notify me if her sx persist/worsen. All questions were answered to her satisfaction.     COVID-19  Education: The signs and symptoms of COVID-19 were discussed with the patient and how to seek care for testing (follow up with PCP or arrange E-visit).  The importance of social distancing was discussed today.  Patient Risk:   After full review of this patients clinical status, I feel that they are at least moderate risk at this time.   Medication Adjustments/Labs and Tests Ordered: Current medicines are reviewed at length with the patient today.  Concerns regarding medicines are outlined above.   Tests Ordered: No orders of the defined types were placed in this encounter.   Medication Changes: Meds ordered this encounter  Medications  . doxycycline (VIBRAMYCIN) 100 MG capsule    Sig: Take 1 capsule (100 mg total) by mouth 2 (two) times daily.    Dispense:  14 capsule    Refill:  0    Disposition:  Follow up prn  Signed, Maximino Greenland, MD

## 2019-09-08 ENCOUNTER — Other Ambulatory Visit: Payer: BLUE CROSS/BLUE SHIELD

## 2019-09-08 ENCOUNTER — Other Ambulatory Visit: Payer: Self-pay

## 2019-09-08 DIAGNOSIS — Z0000 Encounter for general adult medical examination without abnormal findings: Secondary | ICD-10-CM

## 2019-09-08 DIAGNOSIS — E1165 Type 2 diabetes mellitus with hyperglycemia: Secondary | ICD-10-CM

## 2019-09-09 LAB — CMP14+EGFR
ALT: 17 IU/L (ref 0–32)
AST: 28 IU/L (ref 0–40)
Albumin/Globulin Ratio: 1.4 (ref 1.2–2.2)
Albumin: 4.1 g/dL (ref 3.8–4.8)
Alkaline Phosphatase: 61 IU/L (ref 39–117)
BUN/Creatinine Ratio: 12 (ref 9–23)
BUN: 10 mg/dL (ref 6–24)
Bilirubin Total: 0.3 mg/dL (ref 0.0–1.2)
CO2: 24 mmol/L (ref 20–29)
Calcium: 9.5 mg/dL (ref 8.7–10.2)
Chloride: 100 mmol/L (ref 96–106)
Creatinine, Ser: 0.85 mg/dL (ref 0.57–1.00)
GFR calc Af Amer: 98 mL/min/{1.73_m2} (ref >59)
GFR calc non Af Amer: 85 mL/min/{1.73_m2} (ref >59)
Globulin, Total: 3 g/dL (ref 1.5–4.5)
Glucose: 124 mg/dL — ABNORMAL HIGH (ref 65–99)
Potassium: 4.3 mmol/L (ref 3.5–5.2)
Sodium: 137 mmol/L (ref 134–144)
Total Protein: 7.1 g/dL (ref 6.0–8.5)

## 2019-09-09 LAB — CBC
Hematocrit: 40.6 % (ref 34.0–46.6)
Hemoglobin: 13 g/dL (ref 11.1–15.9)
MCH: 28 pg (ref 26.6–33.0)
MCHC: 32 g/dL (ref 31.5–35.7)
MCV: 88 fL (ref 79–97)
Platelets: 192 10*3/uL (ref 150–450)
RBC: 4.64 x10E6/uL (ref 3.77–5.28)
RDW: 15.8 % — ABNORMAL HIGH (ref 11.7–15.4)
WBC: 4.6 10*3/uL (ref 3.4–10.8)

## 2019-09-09 LAB — LIPID PANEL
Chol/HDL Ratio: 3.6 ratio (ref 0.0–4.4)
Cholesterol, Total: 200 mg/dL — ABNORMAL HIGH (ref 100–199)
HDL: 56 mg/dL (ref >39)
LDL Chol Calc (NIH): 127 mg/dL — ABNORMAL HIGH (ref 0–99)
Triglycerides: 94 mg/dL (ref 0–149)
VLDL Cholesterol Cal: 17 mg/dL (ref 5–40)

## 2019-09-09 LAB — HEMOGLOBIN A1C
Est. average glucose Bld gHb Est-mCnc: 140 mg/dL
Hgb A1c MFr Bld: 6.5 % — ABNORMAL HIGH (ref 4.8–5.6)

## 2019-09-12 ENCOUNTER — Encounter: Payer: Self-pay | Admitting: Internal Medicine

## 2019-09-12 ENCOUNTER — Ambulatory Visit (INDEPENDENT_AMBULATORY_CARE_PROVIDER_SITE_OTHER): Payer: 59 | Admitting: Internal Medicine

## 2019-09-12 ENCOUNTER — Other Ambulatory Visit: Payer: Self-pay

## 2019-09-12 VITALS — BP 112/74 | HR 98 | Temp 98.4°F | Ht 62.4 in | Wt 140.4 lb

## 2019-09-12 DIAGNOSIS — E1165 Type 2 diabetes mellitus with hyperglycemia: Secondary | ICD-10-CM | POA: Diagnosis not present

## 2019-09-12 DIAGNOSIS — Z0000 Encounter for general adult medical examination without abnormal findings: Secondary | ICD-10-CM | POA: Diagnosis not present

## 2019-09-12 DIAGNOSIS — Z23 Encounter for immunization: Secondary | ICD-10-CM | POA: Diagnosis not present

## 2019-09-12 LAB — POCT UA - MICROALBUMIN
Albumin/Creatinine Ratio, Urine, POC: <30
Creatinine, POC: 100 mg/dL
Microalbumin Ur, POC: 10 mg/L

## 2019-09-12 LAB — POCT URINALYSIS DIPSTICK
Bilirubin, UA: NEGATIVE
Blood, UA: NEGATIVE
Glucose, UA: NEGATIVE
Ketones, UA: NEGATIVE
Leukocytes, UA: NEGATIVE
Nitrite, UA: NEGATIVE
Protein, UA: NEGATIVE
Spec Grav, UA: 1.02 (ref 1.010–1.025)
Urobilinogen, UA: 0.2 E.U./dL
pH, UA: 7.5 (ref 5.0–8.0)

## 2019-09-12 NOTE — Progress Notes (Signed)
This visit occurred during the SARS-CoV-2 public health emergency.  Safety protocols were in place, including screening questions prior to the visit, additional usage of staff PPE, and extensive cleaning of exam room while observing appropriate contact time as indicated for disinfecting solutions.  Subjective:     Patient ID: Virginia Mcdonald , female    DOB: 03-18-1976 , 44 y.o.   MRN: [id number]   Chief Complaint  Patient presents with  . Annual Exam  . Diabetes    HPI  She is here today for a full physical examination. She is followed by Dr. Landry Mellow for her GYN exams.  She was last seen in 2019.   Diabetes She presents for her follow-up diabetic visit. She has type 2 diabetes mellitus. Her disease course has been stable. There are no hypoglycemic associated symptoms. There are no diabetic associated symptoms. There are no diabetic complications. Risk factors for coronary artery disease include diabetes mellitus and dyslipidemia.     Past Medical History:  Diagnosis Date  . Diet-controlled diabetes mellitus ([place])      Family History  Problem Relation Age of Onset  . Diabetes Mother   . Hypertension Mother   . Hypertension Father   . Hypertension Sister   . Diabetes Sister   . Healthy Brother   . Healthy Daughter   . Healthy Brother   . Healthy Brother   . Healthy Daughter   . Healthy Daughter      Current Outpatient Medications:  .  ACCU-CHEK FASTCLIX LANCETS MISC, Use as directed to check blood sugars 1 time per day dx: e11.65, Disp: 50 each, Rfl: 11 .  CALCIUM PO, Take by mouth. Herbal life, Disp: , Rfl:  .  Cholecalciferol (VITAMIN D PO), Take 2,000 Units by mouth. , Disp: , Rfl:  .  doxycycline (VIBRAMYCIN) 100 MG capsule, Take 1 capsule (100 mg total) by mouth 2 (two) times daily., Disp: 14 capsule, Rfl: 0 .  ferrous sulfate 325 (65 FE) MG tablet, Take 325 mg by mouth as needed. , Disp: , Rfl:  .  glucose blood (ACCU-CHEK GUIDE) test strip, Use as instructed to  check blood sugars 1 time per day dx: e11.65, Disp: 50 each, Rfl: 11 .  polyethylene glycol powder (MIRALAX) powder, Take 1 Container by mouth as needed. , Disp: , Rfl:  .  Prenatal Vit-Fe Fumarate-FA (PRENATAL PO), Take by mouth., Disp: , Rfl:    Allergies  Allergen Reactions  . Penicillins   . Procardia [Nifedipine] Palpitations      The patient states she uses none for birth control. Last LMP was Patient's last menstrual period was 08/26/2019 (approximate).. Negative for Dysmenorrhea  Negative for: breast discharge, breast lump(s), breast pain and breast self exam. Associated symptoms include abnormal vaginal bleeding. Pertinent negatives include abnormal bleeding (hematology), anxiety, decreased libido, depression, difficulty falling sleep, dyspareunia, history of infertility, nocturia, sexual dysfunction, sleep disturbances, urinary incontinence, urinary urgency, vaginal discharge and vaginal itching. Diet regular.The patient states her exercise level is  moderate.  . The patient's tobacco use is:  Social History   Tobacco Use  Smoking Status Never Smoker  Smokeless Tobacco Never Used  . She has been exposed to passive smoke. The patient's alcohol use is:  Social History   Substance and Sexual Activity  Alcohol Use No    Review of Systems  Constitutional: Negative.   HENT: Negative.   Eyes: Negative.   Respiratory: Negative.   Cardiovascular: Negative.   Endocrine: Negative.   Genitourinary: Negative.  Musculoskeletal: Negative.   Skin: Negative.   Allergic/Immunologic: Negative.   Neurological: Negative.   Hematological: Negative.   Psychiatric/Behavioral: Negative.      Today's Vitals   09/12/19 1006  BP: 112/74  Pulse: 98  Temp: 98.4 F (36.9 C)  TempSrc: Oral  Weight: 140 lb 6.4 oz (63.7 kg)  Height: 5' 2.4" (1.585 m)   Body mass index is 25.35 kg/m.   Objective:  Physical Exam Vitals and nursing note reviewed.  Constitutional:      Appearance:  Normal appearance.  HENT:     Head: Normocephalic and atraumatic.     Right Ear: Tympanic membrane, ear canal and external ear normal.     Left Ear: Tympanic membrane, ear canal and external ear normal.     Nose:     Comments: Deferred, masked    Mouth/Throat:     Comments: Deferred, masked Eyes:     Extraocular Movements: Extraocular movements intact.     Conjunctiva/sclera: Conjunctivae normal.     Pupils: Pupils are equal, round, and reactive to light.  Cardiovascular:     Rate and Rhythm: Normal rate and regular rhythm.     Pulses: Normal pulses.     Heart sounds: Normal heart sounds.  Pulmonary:     Effort: Pulmonary effort is normal.     Breath sounds: Normal breath sounds.  Chest:     Breasts: Tanner Score is 5.        Right: Normal.        Left: Normal.  Abdominal:     General: Abdomen is flat. Bowel sounds are normal.     Palpations: Abdomen is soft.  Genitourinary:    Comments: deferred Musculoskeletal:        General: Normal range of motion.     Cervical back: Normal range of motion and neck supple.  Skin:    General: Skin is warm and dry.  Neurological:     General: No focal deficit present.     Mental Status: She is alert and oriented to person, place, and time.  Psychiatric:        Mood and Affect: Mood normal.        Behavior: Behavior normal.         Assessment And Plan:     1. Routine general medical examination at health care facility  A full exam was performed. She had her blood drawn on 09/08/2019.  We went over her results in full detail during her visit. All questions were answered to her satisfaction. PATIENT IS ENCOURAGED TO GET 30-45 MINUTES REGULAR EXERCISE NO LESS THAN FOUR TO FIVE DAYS PER WEEK - BOTH WEIGHTBEARING EXERCISES AND AEROBIC ARE RECOMMENDED.  SHE IS ADVISED TO FOLLOW A HEALTHY DIET WITH AT LEAST SIX FRUITS/VEGGIES PER DAY, DECREASE INTAKE OF RED MEAT, AND TO INCREASE FISH INTAKE TO TWO DAYS PER WEEK.  MEATS/FISH SHOULD NOT BE  FRIED, BAKED OR BROILED IS PREFERABLE.  I SUGGEST WEARING SPF 50 SUNSCREEN ON EXPOSED PARTS AND ESPECIALLY WHEN IN THE DIRECT SUNLIGHT FOR AN EXTENDED PERIOD OF TIME.  PLEASE AVOID FAST FOOD RESTAURANTS AND INCREASE YOUR WATER INTAKE.   2. Uncontrolled type 2 diabetes mellitus with hyperglycemia ([facility])  Diabetic foot exam was performed.  She is still not interested in taking prescription medication. She had her labs drawn a week ago, we went over her results in full detail. I DISCUSSED WITH THE PATIENT AT LENGTH REGARDING THE GOALS OF GLYCEMIC CONTROL AND POSSIBLE LONG-TERM COMPLICATIONS.  I  ALSO STRESSED THE IMPORTANCE OF COMPLIANCE WITH HOME GLUCOSE MONITORING, DIETARY RESTRICTIONS INCLUDING AVOIDANCE OF SUGARY DRINKS/PROCESSED FOODS,  ALONG WITH REGULAR EXERCISE.  I  ALSO STRESSED THE IMPORTANCE OF ANNUAL EYE EXAMS, SELF FOOT CARE AND COMPLIANCE WITH OFFICE VISITS.  - POCT Urinalysis Dipstick (81002) - POCT UA - Microalbumin  3. Immunization due  She was given pneumovax-23 to update her immunization history.   Maximino Greenland, MD    THE PATIENT IS ENCOURAGED TO PRACTICE SOCIAL DISTANCING DUE TO THE COVID-19 PANDEMIC.

## 2019-09-12 NOTE — Patient Instructions (Signed)
Calm, magnesium supplement - start with 1 tsp nightly   Health Maintenance, Female Adopting a healthy lifestyle and getting preventive care are important in promoting health and wellness. Ask your health care provider about:  The right schedule for you to have regular tests and exams.  Things you can do on your own to prevent diseases and keep yourself healthy. What should I know about diet, weight, and exercise? Eat a healthy diet   Eat a diet that includes plenty of vegetables, fruits, low-fat dairy products, and lean protein.  Do not eat a lot of foods that are high in solid fats, added sugars, or sodium. Maintain a healthy weight Body mass index (BMI) is used to identify weight problems. It estimates body fat based on height and weight. Your health care provider can help determine your BMI and help you achieve or maintain a healthy weight. Get regular exercise Get regular exercise. This is one of the most important things you can do for your health. Most adults should:  Exercise for at least 150 minutes each week. The exercise should increase your heart rate and make you sweat (moderate-intensity exercise).  Do strengthening exercises at least twice a week. This is in addition to the moderate-intensity exercise.  Spend less time sitting. Even light physical activity can be beneficial. Watch cholesterol and blood lipids Have your blood tested for lipids and cholesterol at 43 years of age, then have this test every 5 years. Have your cholesterol levels checked more often if:  Your lipid or cholesterol levels are high.  You are older than 43 years of age.  You are at high risk for heart disease. What should I know about cancer screening? Depending on your health history and family history, you may need to have cancer screening at various ages. This may include screening for:  Breast cancer.  Cervical cancer.  Colorectal cancer.  Skin cancer.  Lung cancer. What should I  know about heart disease, diabetes, and high blood pressure? Blood pressure and heart disease  High blood pressure causes heart disease and increases the risk of stroke. This is more likely to develop in people who have high blood pressure readings, are of African descent, or are overweight.  Have your blood pressure checked: ? Every 3-5 years if you are 57-38 years of age. ? Every year if you are 48 years old or older. Diabetes Have regular diabetes screenings. This checks your fasting blood sugar level. Have the screening done:  Once every three years after age 8 if you are at a normal weight and have a low risk for diabetes.  More often and at a younger age if you are overweight or have a high risk for diabetes. What should I know about preventing infection? Hepatitis B If you have a higher risk for hepatitis B, you should be screened for this virus. Talk with your health care provider to find out if you are at risk for hepatitis B infection. Hepatitis C Testing is recommended for:  Everyone born from [birth year] through 1964.  Anyone with known risk factors for hepatitis C. Sexually transmitted infections (STIs)  Get screened for STIs, including gonorrhea and chlamydia, if: ? You are sexually active and are younger than 43 years of age. ? You are older than 43 years of age and your health care provider tells you that you are at risk for this type of infection. ? Your sexual activity has changed since you were last screened, and you are at increased  risk for chlamydia or gonorrhea. Ask your health care provider if you are at risk.  Ask your health care provider about whether you are at high risk for HIV. Your health care provider may recommend a prescription medicine to help prevent HIV infection. If you choose to take medicine to prevent HIV, you should first get tested for HIV. You should then be tested every 3 months for as long as you are taking the medicine. Pregnancy  If you are  about to stop having your period (premenopausal) and you may become pregnant, seek counseling before you get pregnant.  Take 400 to 800 micrograms (mcg) of folic acid every day if you become pregnant.  Ask for birth control (contraception) if you want to prevent pregnancy. Osteoporosis and menopause Osteoporosis is a disease in which the bones lose minerals and strength with aging. This can result in bone fractures. If you are 9 years old or older, or if you are at risk for osteoporosis and fractures, ask your health care provider if you should:  Be screened for bone loss.  Take a calcium or vitamin D supplement to lower your risk of fractures.  Be given hormone replacement therapy (HRT) to treat symptoms of menopause. Follow these instructions at home: Lifestyle  Do not use any products that contain nicotine or tobacco, such as cigarettes, e-cigarettes, and chewing tobacco. If you need help quitting, ask your health care provider.  Do not use street drugs.  Do not share needles.  Ask your health care provider for help if you need support or information about quitting drugs. Alcohol use  Do not drink alcohol if: ? Your health care provider tells you not to drink. ? You are pregnant, may be pregnant, or are planning to become pregnant.  If you drink alcohol: ? Limit how much you use to 0-1 drink a day. ? Limit intake if you are breastfeeding.  Be aware of how much alcohol is in your drink. In the [place] one drink equals one 12 oz bottle of beer (355 mL), one 5 oz glass of wine (148 mL), or one 1 oz glass of hard liquor (44 mL). General instructions  Schedule regular health, dental, and eye exams.  Stay current with your vaccines.  Tell your health care provider if: ? You often feel depressed. ? You have ever been abused or do not feel safe at home. Summary  Adopting a healthy lifestyle and getting preventive care are important in promoting health and wellness.  Follow  your health care provider's instructions about healthy diet, exercising, and getting tested or screened for diseases.  Follow your health care provider's instructions on monitoring your cholesterol and blood pressure. This information is not intended to replace advice given to you by your health care provider. Make sure you discuss any questions you have with your health care provider. Document Revised: 08/18/2018 Document Reviewed: 08/18/2018 Elsevier Patient Education  2020 [employer].

## 2019-10-04 ENCOUNTER — Other Ambulatory Visit: Payer: Self-pay | Admitting: Internal Medicine

## 2019-10-04 DIAGNOSIS — Z1231 Encounter for screening mammogram for malignant neoplasm of breast: Secondary | ICD-10-CM

## 2019-10-17 ENCOUNTER — Encounter: Payer: Self-pay | Admitting: Internal Medicine

## 2019-10-31 ENCOUNTER — Encounter: Payer: Self-pay | Admitting: Internal Medicine

## 2019-11-01 ENCOUNTER — Other Ambulatory Visit (HOSPITAL_COMMUNITY): Admission: RE | Admit: 2019-11-01 | Payer: BLUE CROSS/BLUE SHIELD | Source: Ambulatory Visit

## 2019-11-01 ENCOUNTER — Other Ambulatory Visit: Payer: Self-pay

## 2019-11-01 ENCOUNTER — Ambulatory Visit (INDEPENDENT_AMBULATORY_CARE_PROVIDER_SITE_OTHER): Payer: 59 | Admitting: Nurse Practitioner

## 2019-11-01 ENCOUNTER — Encounter: Payer: Self-pay | Admitting: Nurse Practitioner

## 2019-11-01 VITALS — BP 112/70 | HR 83 | Temp 98.4°F | Ht 62.4 in | Wt 144.6 lb

## 2019-11-01 DIAGNOSIS — R739 Hyperglycemia, unspecified: Secondary | ICD-10-CM | POA: Diagnosis not present

## 2019-11-01 DIAGNOSIS — M545 Low back pain, unspecified: Secondary | ICD-10-CM

## 2019-11-01 DIAGNOSIS — R109 Unspecified abdominal pain: Secondary | ICD-10-CM

## 2019-11-01 LAB — POCT URINALYSIS DIPSTICK
Bilirubin, UA: NEGATIVE
Blood, UA: NEGATIVE
Glucose, UA: NEGATIVE
Ketones, UA: NEGATIVE
Leukocytes, UA: NEGATIVE
Nitrite, UA: NEGATIVE
Protein, UA: NEGATIVE
Spec Grav, UA: 1.025 (ref 1.010–1.025)
Urobilinogen, UA: 0.2 E.U./dL
pH, UA: 5.5 (ref 5.0–8.0)

## 2019-11-01 MED ORDER — MELOXICAM 7.5 MG PO TABS: 7.5000 mg | ORAL_TABLET | Freq: Every day | ORAL | 0 refills | Status: DC

## 2019-11-01 NOTE — Patient Instructions (Signed)
Sciatica  Sciatica is pain, weakness, tingling, or loss of feeling (numbness) along the sciatic nerve. The sciatic nerve starts in the lower back and goes down the back of each leg. Sciatica usually goes away on its own or with treatment. Sometimes, sciatica may come back (recur). What are the causes? This condition happens when the sciatic nerve is pinched or has pressure put on it. This may be the result of:  A disk in between the bones of the spine bulging out too far (herniated disk).  Changes in the spinal disks that occur with aging.  A condition that affects a muscle in the butt.  Extra bone growth near the sciatic nerve. A break (fracture) of the area between your hip bones (p Sciatica Rehab Ask your health care provider which exercises are safe for you. Do exercises exactly as told by your health care provider and adjust them as directed. It is normal to feel mild stretching, pulling, tightness, or discomfort as you do these exercises. Stop right away if you feel sudden pain or your pain gets worse. Do not begin these exercises until told by your health care provider. Stretching and range-of-motion exercises These exercises warm up your muscles and joints and improve the movement and flexibility of your hips and back. These exercises also help to relieve pain, numbness, and tingling. Sciatic nerve glide 1. Sit in a chair with your head facing down toward your chest. Place your hands behind your back. Let your shoulders slump forward. 2. Slowly straighten one of your legs while you tilt your head back as if you are looking toward the ceiling. Only straighten your leg as far as you can without making your symptoms worse. 3. Hold this position for __________ seconds. 4. Slowly return to the starting position. 5. Repeat with your other leg. Repeat __________ times. Complete this exercise __________ times a day. Knee to chest with hip adduction and internal rotation  1. Lie on your  back on a firm surface with both legs straight. 2. Bend one of your knees and move it up toward your chest until you feel a gentle stretch in your lower back and buttock. Then, move your knee toward the shoulder that is on the opposite side from your leg. This is hip adduction and internal rotation. ? Hold your leg in this position by holding on to the front of your knee. 3. Hold this position for __________ seconds. 4. Slowly return to the starting position. 5. Repeat with your other leg. Repeat __________ times. Complete this exercise __________ times a day. Prone extension on elbows  1. Lie on your abdomen on a firm surface. A bed may be too soft for this exercise. 2. Prop yourself up on your elbows. 3. Use your arms to help lift your chest up until you feel a gentle stretch in your abdomen and your lower back. ? This will place some of your body weight on your elbows. If this is uncomfortable, try stacking pillows under your chest. ? Your hips should stay down, against the surface that you are lying on. Keep your hip and back muscles relaxed. 4. Hold this position for __________ seconds. 5. Slowly relax your upper body and return to the starting position. Repeat __________ times. Complete this exercise __________ times a day. Strengthening exercises These exercises build strength and endurance in your back. Endurance is the ability to use your muscles for a long time, even after they get tired. Pelvic tilt This exercise strengthens the muscles  that lie deep in the abdomen. 1. Lie on your back on a firm surface. Bend your knees and keep your feet flat on the floor. 2. Tense your abdominal muscles. Tip your pelvis up toward the ceiling and flatten your lower back into the floor. ? To help with this exercise, you may place a small towel under your lower back and try to push your back into the towel. 3. Hold this position for __________ seconds. 4. Let your muscles relax completely before  you repeat this exercise. Repeat __________ times. Complete this exercise __________ times a day. Alternating arm and leg raises  1. Get on your hands and knees on a firm surface. If you are on a hard floor, you may want to use padding, such as an exercise mat, to cushion your knees. 2. Line up your arms and legs. Your hands should be directly below your shoulders, and your knees should be directly below your hips. 3. Lift your left leg behind you. At the same time, raise your right arm and straighten it in front of you. ? Do not lift your leg higher than your hip. ? Do not lift your arm higher than your shoulder. ? Keep your abdominal and back muscles tight. ? Keep your hips facing the ground. ? Do not arch your back. ? Keep your balance carefully, and do not hold your breath. 4. Hold this position for __________ seconds. 5. Slowly return to the starting position. 6. Repeat with your right leg and your left arm. Repeat __________ times. Complete this exercise __________ times a day. Posture and body mechanics Good posture and healthy body mechanics can help to relieve stress in your body's tissues and joints. Body mechanics refers to the movements and positions of your body while you do your daily activities. Posture is part of body mechanics. Good posture means:  Your spine is in its natural S-curve position (neutral).  Your shoulders are pulled back slightly.  Your head is not tipped forward. Follow these guidelines to improve your posture and body mechanics in your everyday activities. Standing   When standing, keep your spine neutral and your feet about hip width apart. Keep a slight bend in your knees. Your ears, shoulders, and hips should line up.  When you do a task in which you stand in one place for a long time, place one foot up on a stable object that is 2-4 inches (5-10 cm) high, such as a footstool. This helps keep your spine neutral. Sitting   When sitting, keep your  spine neutral and keep your feet flat on the floor. Use a footrest, if necessary, and keep your thighs parallel to the floor. Avoid rounding your shoulders, and avoid tilting your head forward.  When working at a desk or a computer, keep your desk at a height where your hands are slightly lower than your elbows. Slide your chair under your desk so you are close enough to maintain good posture.  When working at a computer, place your monitor at a height where you are looking straight ahead and you do not have to tilt your head forward or downward to look at the screen. Resting  When lying down and resting, avoid positions that are most painful for you.  If you have pain with activities such as sitting, bending, stooping, or squatting, lie in a position in which your body does not bend very much. For example, avoid curling up on your side with your arms and knees near  your chest (fetal position).  If you have pain with activities such as standing for a long time or reaching with your arms, lie with your spine in a neutral position and bend your knees slightly. Try the following positions: ? Lying on your side with a pillow between your knees. ? Lying on your back with a pillow under your knees. Lifting   When lifting objects, keep your feet at least shoulder width apart and tighten your abdominal muscles.  Bend your knees and hips and keep your spine neutral. It is important to lift using the strength of your legs, not your back. Do not lock your knees straight out.  Always ask for help to lift heavy or awkward objects. This information is not intended to replace advice given to you by your health care provider. Make sure you discuss any questions you have with your health care provider. Document Revised: 12/17/2018 Document Reviewed: 09/16/2018 Elsevier Patient Education  [place]).  Pregnancy.  Tumor. This is rare. What increases the risk? You are more likely to  develop this condition if you:  Play sports that put pressure or stress on the spine.  Have poor strength and ease of movement (flexibility).  Have had a back injury in the past.  Have had back surgery.  Sit for long periods of time.  Do activities that involve bending or lifting over and over again.  Are very overweight (obese). What are the signs or symptoms? Symptoms can vary from mild to very bad. They may include:  Any of these problems in the lower back, leg, hip, or butt: ? Mild tingling, loss of feeling, or dull aches. ? Burning sensations. ? Sharp pains.  Loss of feeling in the back of the calf or the sole of the foot.  Leg weakness.  Very bad back pain that makes it hard to move. These symptoms may get worse when you cough, sneeze, or laugh. They may also get worse when you sit or stand for long periods of time. How is this treated? This condition often gets better without any treatment. However, treatment may include:  Changing or cutting back on physical activity when you have pain.  Doing exercises and stretching.  Putting ice or heat on the affected area.  Medicines that help: ? To relieve pain and swelling. ? To relax your muscles.  Shots (injections) of medicines that help to relieve pain, irritation, and swelling.  Surgery. Follow these instructions at home: Medicines  Take over-the-counter and prescription medicines only as told by your doctor.  Ask your doctor if the medicine prescribed to you: ? Requires you to avoid driving or using heavy machinery. ? Can cause trouble pooping (constipation). You may need to take these steps to prevent or treat trouble pooping:  Drink enough fluids to keep your pee (urine) pale yellow.  Take over-the-counter or prescription medicines.  Eat foods that are high in fiber. These include beans, whole grains, and fresh fruits and vegetables.  Limit foods that are high in fat and sugar. These include fried or  sweet foods. Managing pain      If told, put ice on the affected area. ? Put ice in a plastic bag. ? Place a towel between your skin and the bag. ? Leave the ice on for 20 minutes, 2-3 times a day.  If told, put heat on the affected area. Use the heat source that your doctor tells you to use, such as a moist heat pack  or a heating pad. ? Place a towel between your skin and the heat source. ? Leave the heat on for 20-30 minutes. ? Remove the heat if your skin turns bright red. This is very important if you are unable to feel pain, heat, or cold. You may have a greater risk of getting burned. Activity   Return to your normal activities as told by your doctor. Ask your doctor what activities are safe for you.  Avoid activities that make your symptoms worse.  Take short rests during the day. ? When you rest for a long time, do some physical activity or stretching between periods of rest. ? Avoid sitting for a long time without moving. Get up and move around at least one time each hour.  Exercise and stretch regularly, as told by your doctor.  Do not lift anything that is heavier than 10 lb (4.5 kg) while you have symptoms of sciatica. ? Avoid lifting heavy things even when you do not have symptoms. ? Avoid lifting heavy things over and over.  When you lift objects, always lift in a way that is safe for your body. To do this, you should: ? Bend your knees. ? Keep the object close to your body. ? Avoid twisting. General instructions  Stay at a healthy weight.  Wear comfortable shoes that support your feet. Avoid wearing high heels.  Avoid sleeping on a mattress that is too soft or too hard. You might have less pain if you sleep on a mattress that is firm enough to support your back.  Keep all follow-up visits as told by your doctor. This is important. Contact a doctor if:  You have pain that: ? Wakes you up when you are sleeping. ? Gets worse when you lie down. ? Is worse  than the pain you have had in the past. ? Lasts longer than 4 weeks.  You lose weight without trying. Get help right away if:  You cannot control when you pee (urinate) or poop (have a bowel movement).  You have weakness in any of these areas and it gets worse: ? Lower back. ? The area between your hip bones. ? Butt. ? Legs.  You have redness or swelling of your back.  You have a burning feeling when you pee. Summary  Sciatica is pain, weakness, tingling, or loss of feeling (numbness) along the sciatic nerve.  This condition happens when the sciatic nerve is pinched or has pressure put on it.  Sciatica can cause pain, tingling, or loss of feeling (numbness) in the lower back, legs, hips, and butt.  Treatment often includes rest, exercise, medicines, and putting ice or heat on the affected area. This information is not intended to replace advice given to you by your health care provider. Make sure you discuss any questions you have with your health care provider. Document Revised: 09/13/2018 Document Reviewed: 09/13/2018 Elsevier Patient Education  [place].

## 2019-11-01 NOTE — Progress Notes (Signed)
This visit occurred during the SARS-CoV-2 public health emergency.  Safety protocols were in place, including screening questions prior to the visit, additional usage of staff PPE, and extensive cleaning of exam room while observing appropriate contact time as indicated for disinfecting solutions.  Subjective:     Patient ID: Virginia Mcdonald , female    DOB: 1976-07-10 , 44 y.o.   MRN: [id number]   Chief Complaint  Patient presents with  . Abdominal Pain    Patient stated her right flank area has been hurting her and her urine has a odor and the color was yellow.     HPI  Pain has improved and the urine is clearing up.  Initially started about 2 weeks ago.  She did have some mucous for 2-3 days which resolved.   Flank Pain This is a new problem. The current episode started 1 to 4 weeks ago (2 weeks ago). The quality of the pain is described as shooting. Radiates to: right leg. Pertinent negatives include no abdominal pain, bladder incontinence, dysuria, headaches or weakness. Treatments tried: increased water intake. The treatment provided moderate relief.     Past Medical History:  Diagnosis Date  . Diet-controlled diabetes mellitus ([place])      Family History  Problem Relation Age of Onset  . Diabetes Mother   . Hypertension Mother   . Hypertension Father   . Hypertension Sister   . Diabetes Sister   . Healthy Brother   . Healthy Daughter   . Healthy Brother   . Healthy Brother   . Healthy Daughter   . Healthy Daughter      Current Outpatient Medications:  .  ACCU-CHEK FASTCLIX LANCETS MISC, Use as directed to check blood sugars 1 time per day dx: e11.65, Disp: 50 each, Rfl: 11 .  CALCIUM PO, Take by mouth. Herbal life, Disp: , Rfl:  .  Cholecalciferol (VITAMIN D PO), Take 2,000 Units by mouth. , Disp: , Rfl:  .  glucose blood (ACCU-CHEK GUIDE) test strip, Use as instructed to check blood sugars 1 time per day dx: e11.65, Disp: 50 each, Rfl: 11 .  ferrous sulfate 325  (65 FE) MG tablet, Take 325 mg by mouth as needed. , Disp: , Rfl:  .  polyethylene glycol powder (MIRALAX) powder, Take 1 Container by mouth as needed. , Disp: , Rfl:  .  Prenatal Vit-Fe Fumarate-FA (PRENATAL PO), Take by mouth., Disp: , Rfl:    Allergies  Allergen Reactions  . Penicillins   . Procardia [Nifedipine] Palpitations     Review of Systems  Constitutional: Negative.   Respiratory: Negative.   Cardiovascular: Negative.   Gastrointestinal: Negative for abdominal pain and nausea.  Endocrine: Negative for polydipsia, polyphagia and polyuria.  Genitourinary: Positive for flank pain and frequency (one week ago). Negative for bladder incontinence, decreased urine volume, difficulty urinating and dysuria.  Neurological: Negative for dizziness, weakness and headaches.  Psychiatric/Behavioral: Negative.      Today's Vitals   11/01/19 0914  BP: 112/70  Pulse: 83  Temp: 98.4 F (36.9 C)  Weight: 144 lb 9.6 oz (65.6 kg)  Height: 5' 2.4" (1.585 m)  PainSc: 3   PainLoc: Abdomen   Body mass index is 26.11 kg/m.   Objective:  Physical Exam Constitutional:      General: She is not in acute distress.    Appearance: Normal appearance. She is well-developed.  Cardiovascular:     Rate and Rhythm: Normal rate and regular rhythm.  Pulses: Normal pulses.     Heart sounds: Normal heart sounds. No murmur.  Pulmonary:     Effort: Pulmonary effort is normal. No respiratory distress.     Breath sounds: Normal breath sounds.  Abdominal:     Tenderness: There is no abdominal tenderness. There is no right CVA tenderness or left CVA tenderness.  Musculoskeletal:        General: Tenderness (slight positive straight leg raise on right) present. Normal range of motion.     Right lower leg: No edema.     Left lower leg: No edema.  Neurological:     General: No focal deficit present.     Mental Status: She is alert.  Psychiatric:        Mood and Affect: Mood normal.        Behavior:  Behavior normal.        Thought Content: Thought content normal.        Judgment: Judgment normal.         Assessment And Plan:     1. Flank pain  Urinalysis is normal - POCT Urinalysis Dipstick (81002) - Urine cytology ancillary only  2. Acute right-sided low back pain without sciatica  This is likely causing her low back pain  Will provide with meloxicam daily due to elevated blood sugar and wanting to avoid steroids  3. Elevated blood sugar   advised to continue to monitor blood sugar had an one time elevated blood sugar  - CBC - BMP8+eGFR       Minette Brine, FNP    THE PATIENT IS ENCOURAGED TO PRACTICE SOCIAL DISTANCING DUE TO THE COVID-19 PANDEMIC.

## 2019-11-02 LAB — BMP8+EGFR
BUN/Creatinine Ratio: 13 (ref 9–23)
BUN: 10 mg/dL (ref 6–24)
CO2: 20 mmol/L (ref 20–29)
Calcium: 9.3 mg/dL (ref 8.7–10.2)
Chloride: 101 mmol/L (ref 96–106)
Creatinine, Ser: 0.76 mg/dL (ref 0.57–1.00)
GFR calc Af Amer: 112 mL/min/{1.73_m2} (ref >59)
GFR calc non Af Amer: 97 mL/min/{1.73_m2} (ref >59)
Glucose: 119 mg/dL — ABNORMAL HIGH (ref 65–99)
Potassium: 4.4 mmol/L (ref 3.5–5.2)
Sodium: 136 mmol/L (ref 134–144)

## 2019-11-02 LAB — CBC
Hematocrit: 40.2 % (ref 34.0–46.6)
Hemoglobin: 12.9 g/dL (ref 11.1–15.9)
MCH: 27.3 pg (ref 26.6–33.0)
MCHC: 32.1 g/dL (ref 31.5–35.7)
MCV: 85 fL (ref 79–97)
Platelets: 149 10*3/uL — ABNORMAL LOW (ref 150–450)
RBC: 4.72 x10E6/uL (ref 3.77–5.28)
RDW: 15.9 % — ABNORMAL HIGH (ref 11.7–15.4)
WBC: 5.6 10*3/uL (ref 3.4–10.8)

## 2019-11-07 LAB — URINE CYTOLOGY ANCILLARY ONLY: Bacterial Vaginitis-Urine: NEGATIVE

## 2019-11-08 ENCOUNTER — Ambulatory Visit
Admission: RE | Admit: 2019-11-08 | Discharge: 2019-11-08 | Disposition: A | Discharge status: Home / Self Care | Payer: 59 | Source: Ambulatory Visit | Attending: Internal Medicine | Admitting: Internal Medicine

## 2019-11-08 ENCOUNTER — Other Ambulatory Visit: Payer: Self-pay

## 2019-11-08 ENCOUNTER — Encounter: Payer: Self-pay | Admitting: Nurse Practitioner

## 2019-11-08 DIAGNOSIS — Z1231 Encounter for screening mammogram for malignant neoplasm of breast: Secondary | ICD-10-CM

## 2019-11-15 LAB — HM DIABETES EYE EXAM

## 2019-11-16 ENCOUNTER — Encounter: Payer: Self-pay | Admitting: Internal Medicine

## 2019-11-24 ENCOUNTER — Encounter: Payer: Self-pay | Admitting: Internal Medicine

## 2019-12-05 ENCOUNTER — Encounter: Payer: Self-pay | Admitting: Internal Medicine

## 2019-12-13 ENCOUNTER — Ambulatory Visit (INDEPENDENT_AMBULATORY_CARE_PROVIDER_SITE_OTHER): Payer: 59 | Admitting: Nurse Practitioner

## 2019-12-13 ENCOUNTER — Ambulatory Visit: Payer: Self-pay | Admitting: Nurse Practitioner

## 2019-12-13 ENCOUNTER — Other Ambulatory Visit: Payer: Self-pay

## 2019-12-13 VITALS — BP 118/76 | HR 89 | Temp 98.0°F | Ht 62.4 in | Wt 141.0 lb

## 2019-12-13 DIAGNOSIS — E7800 Pure hypercholesterolemia, unspecified: Secondary | ICD-10-CM

## 2019-12-13 DIAGNOSIS — R12 Heartburn: Secondary | ICD-10-CM

## 2019-12-13 DIAGNOSIS — Z831 Family history of other infectious and parasitic diseases: Secondary | ICD-10-CM

## 2019-12-13 DIAGNOSIS — R739 Hyperglycemia, unspecified: Secondary | ICD-10-CM

## 2019-12-13 DIAGNOSIS — Z205 Contact with and (suspected) exposure to viral hepatitis: Secondary | ICD-10-CM

## 2019-12-13 NOTE — Progress Notes (Signed)
This visit occurred during the SARS-CoV-2 public health emergency.  Safety protocols were in place, including screening questions prior to the visit, additional usage of staff PPE, and extensive cleaning of exam room while observing appropriate contact time as indicated for disinfecting solutions.  Subjective:     Patient ID: Virginia Mcdonald , female    DOB: 04-Aug-1976 , 44 y.o.   MRN: [id number]   Chief Complaint  Patient presents with  . 6wk follow up    HPI  She feels great  Wt Readings from Last 3 Encounters: 12/13/19 : 141 lb (64 kg) 11/01/19 : 144 lb 9.6 oz (65.6 kg) 09/12/19 : 140 lb 6.4 oz (63.7 kg)  Last night her blood sugar was in the 90's, she is taking synergy but is causing her heartburn. She will take two times a day 30 minutes before breakfast and dinner. She eats a high fiber cereal. And she takes magnesium in the evening.   Diabetes She presents for her follow-up diabetic visit. She has type 2 diabetes mellitus. Her disease course has been stable. There are no hypoglycemic associated symptoms. Pertinent negatives for hypoglycemia include no dizziness or headaches. There are no diabetic associated symptoms. There are no hypoglycemic complications. There are no diabetic complications. Risk factors for coronary artery disease include sedentary lifestyle. When asked about current treatments, none were reported. She is following a generally healthy diet. She has not had a previous visit with a dietitian. She participates in exercise three times a week. She does not see a podiatrist.Eye exam is not current.     Past Medical History:  Diagnosis Date  . Diet-controlled diabetes mellitus ([place])      Family History  Problem Relation Age of Onset  . Diabetes Mother   . Hypertension Mother   . Hypertension Father   . Hypertension Sister   . Diabetes Sister   . Healthy Brother   . Healthy Daughter   . Healthy Brother   . Healthy Brother   . Healthy Daughter   .  Healthy Daughter      Current Outpatient Medications:  .  ACCU-CHEK FASTCLIX LANCETS MISC, Use as directed to check blood sugars 1 time per day dx: e11.65, Disp: 50 each, Rfl: 11 .  CALCIUM PO, Take by mouth. Herbal life, Disp: , Rfl:  .  Cholecalciferol (VITAMIN D PO), Take 2,000 Units by mouth. , Disp: , Rfl:  .  glucose blood (ACCU-CHEK GUIDE) test strip, Use as instructed to check blood sugars 1 time per day dx: e11.65, Disp: 50 each, Rfl: 11   Allergies  Allergen Reactions  . Penicillins   . Procardia [Nifedipine] Palpitations     Review of Systems  Constitutional: Negative.   Respiratory: Negative.   Cardiovascular: Negative.  Negative for palpitations and leg swelling.  Gastrointestinal:       Heartburn  Neurological: Negative for dizziness and headaches.  Psychiatric/Behavioral: Negative.      Today's Vitals   12/13/19 1159  BP: 118/76  Pulse: 89  Temp: 98 F (36.7 C)  TempSrc: Oral  SpO2: 96%  Weight: 141 lb (64 kg)  Height: 5' 2.4" (1.585 m)   Body mass index is 25.46 kg/m.   Objective:  Physical Exam Constitutional:      Appearance: Normal appearance.  Cardiovascular:     Rate and Rhythm: Normal rate and regular rhythm.     Pulses: Normal pulses.     Heart sounds: Normal heart sounds. No murmur.  Neurological:  General: No focal deficit present.     Mental Status: She is alert and oriented to person, place, and time.  Psychiatric:        Mood and Affect: Mood normal.        Behavior: Behavior normal.        Thought Content: Thought content normal.        Judgment: Judgment normal.         Assessment And Plan:     1. Elevated blood sugar  Will recheck HgbA1c - Hemoglobin A1c  2. Elevated cholesterol Chronic, no current medications - Lipid panel  3. Contact with and (suspected) exposure to viral hepatitis  She would like to be tested for hepatitis due to history of her parent being positive  - Hepatitis Acute Panel  4.  Heartburn   - DG ESOPHAGUS W DOUBLE CM (HD); Future   Minette Brine, FNP    THE PATIENT IS ENCOURAGED TO PRACTICE SOCIAL DISTANCING DUE TO THE COVID-19 PANDEMIC.

## 2019-12-14 ENCOUNTER — Encounter: Payer: Self-pay | Admitting: Nurse Practitioner

## 2019-12-14 LAB — LIPID PANEL
Chol/HDL Ratio: 3.4 ratio (ref 0.0–4.4)
Cholesterol, Total: 202 mg/dL — ABNORMAL HIGH (ref 100–199)
HDL: 59 mg/dL (ref >39)
LDL Chol Calc (NIH): 124 mg/dL — ABNORMAL HIGH (ref 0–99)
Triglycerides: 107 mg/dL (ref 0–149)
VLDL Cholesterol Cal: 19 mg/dL (ref 5–40)

## 2019-12-14 LAB — HEPATITIS PANEL, ACUTE
Hep A IgM: NEGATIVE
Hep B C IgM: NEGATIVE
Hep C Virus Ab: <0.1 s/co ratio (ref 0.0–0.9)
Hepatitis B Surface Ag: NEGATIVE

## 2019-12-14 LAB — HEMOGLOBIN A1C
Est. average glucose Bld gHb Est-mCnc: 143 mg/dL
Hgb A1c MFr Bld: 6.6 % — ABNORMAL HIGH (ref 4.8–5.6)

## 2019-12-22 ENCOUNTER — Other Ambulatory Visit: Payer: Self-pay

## 2019-12-22 MED ORDER — METFORMIN HCL 500 MG PO TABS: 500.0000 mg | ORAL_TABLET | Freq: Every day | ORAL | 0 refills | Status: DC

## 2020-01-10 ENCOUNTER — Ambulatory Visit: Payer: 59 | Admitting: Internal Medicine

## 2020-01-27 ENCOUNTER — Encounter: Payer: Self-pay | Admitting: Internal Medicine

## 2020-01-30 ENCOUNTER — Encounter: Payer: Self-pay | Admitting: Nurse Practitioner

## 2020-01-31 ENCOUNTER — Other Ambulatory Visit: Payer: Self-pay

## 2020-01-31 ENCOUNTER — Encounter: Payer: Self-pay | Admitting: Nurse Practitioner

## 2020-01-31 ENCOUNTER — Telehealth (INDEPENDENT_AMBULATORY_CARE_PROVIDER_SITE_OTHER): Payer: 59 | Admitting: Nurse Practitioner

## 2020-01-31 ENCOUNTER — Ambulatory Visit: Payer: Self-pay | Admitting: Nurse Practitioner

## 2020-01-31 ENCOUNTER — Telehealth: Payer: Self-pay

## 2020-01-31 DIAGNOSIS — R7303 Prediabetes: Secondary | ICD-10-CM

## 2020-01-31 DIAGNOSIS — K5900 Constipation, unspecified: Secondary | ICD-10-CM | POA: Diagnosis not present

## 2020-01-31 DIAGNOSIS — K219 Gastro-esophageal reflux disease without esophagitis: Secondary | ICD-10-CM | POA: Diagnosis not present

## 2020-01-31 MED ORDER — RYBELSUS 7 MG PO TABS: ORAL_TABLET | ORAL | 2 refills | Status: DC

## 2020-01-31 NOTE — Telephone Encounter (Signed)
Pt consent to [facility] visit

## 2020-01-31 NOTE — Progress Notes (Signed)
Virtual Visit via Video   This visit type was conducted due to national recommendations for restrictions regarding the COVID-19 Pandemic (e.g. social distancing) in an effort to limit this patient's exposure and mitigate transmission in our community.  Due to her co-morbid illnesses, this patient is at least at moderate risk for complications without adequate follow up.  This format is felt to be most appropriate for this patient at this time.  All issues noted in this document were discussed and addressed.  A limited physical exam was performed with this format.    This visit type was conducted due to national recommendations for restrictions regarding the COVID-19 Pandemic (e.g. social distancing) in an effort to limit this patient's exposure and mitigate transmission in our community.  Patients identity confirmed using two different identifiers.  This format is felt to be most appropriate for this patient at this time.  All issues noted in this document were discussed and addressed.  No physical exam was performed (except for noted visual exam findings with Video Visits).    Date:  02/05/2020   ID:  Haidi, Abston 08/13/1976, MRN [id number]  Patient Location:  Work   Provider location:   [employer] Complaint:  Follow up reflu  History of Present Illness:    Chau Mckearney is a 44 y.o. female who presents via video conferencing for a telehealth visit today.    The patient does not have symptoms concerning for COVID-19 infection (fever, chills, cough, or new shortness of breath).   She is having intermittent reflux over the counter.   All she drinks is water daily 60 oz at least.    Constipation This is a new problem. The current episode started more than 1 month ago. The patient is on a high fiber diet. She exercises regularly. There has been adequate water intake. Pertinent negatives include no abdominal pain, diarrhea, nausea or vomiting. Treatments tried:  miralax      Past Medical History:  Diagnosis Date  . Diet-controlled diabetes mellitus ([place])    Past Surgical History:  Procedure Laterality Date  . CESAREAN SECTION       Current Meds  Medication Sig  . ACCU-CHEK FASTCLIX LANCETS MISC Use as directed to check blood sugars 1 time per day dx: e11.65  . CALCIUM PO Take by mouth. Herbal life  . Cholecalciferol (VITAMIN D PO) Take 2,000 Units by mouth.   Marland Kitchen glucose blood (ACCU-CHEK GUIDE) test strip Use as instructed to check blood sugars 1 time per day dx: e11.65  . metFORMIN (GLUCOPHAGE) 500 MG tablet Take 1 tablet (500 mg total) by mouth daily.     Allergies:   Penicillins and Procardia [nifedipine]   Social History   Tobacco Use  . Smoking status: Never Smoker  . Smokeless tobacco: Never Used  Substance Use Topics  . Alcohol use: No  . Drug use: No     Family Hx: The patient's family history includes Diabetes in her mother and sister; Healthy in her brother, brother, brother, daughter, daughter, and daughter; Hypertension in her father, mother, and sister.  ROS:   Please see the history of present illness.    Review of Systems  Gastrointestinal: Positive for constipation. Negative for abdominal pain, diarrhea, nausea and vomiting.    All other systems reviewed and are negative.   Labs/Other Tests and Data Reviewed:    Recent Labs: 09/08/2019: ALT 17 11/01/2019: BUN 10; Creatinine, Ser 0.76; Hemoglobin 12.9; Platelets 149; Potassium 4.4;  Sodium 136   Recent Lipid Panel Lab Results  Component Value Date/Time   CHOL 202 (H) 12/13/2019 12:30 PM   TRIG 107 12/13/2019 12:30 PM   HDL 59 12/13/2019 12:30 PM   CHOLHDL 3.4 12/13/2019 12:30 PM   LDLCALC 124 (H) 12/13/2019 12:30 PM    Wt Readings from Last 3 Encounters:  12/13/19 141 lb (64 kg)  11/01/19 144 lb 9.6 oz (65.6 kg)  09/12/19 140 lb 6.4 oz (63.7 kg)     Exam:    Vital Signs:  There were no vitals taken for this visit.    Physical  Exam  ASSESSMENT & PLAN:    1. Prediabetes  Chronic, she will try rybelsus due to the metformin causing her constipation - Semaglutide (RYBELSUS) 7 MG TABS; Take one tablet by mouth 30 minutes before breakfast  Dispense: 30 tablet; Refill: 2  2. Constipation, unspecified constipation type  She feels this is related to her metformin, I will discontinue this and start Rybelsus  Encouraged to increase her fiber intake and to take a stool softner  3. Gastroesophageal reflux disease without esophagitis  She is doing better with an acid reducer medication  I have faxed her order for the Barium swallow today had not been picked up from the previous order   COVID-19 Education: The signs and symptoms of COVID-19 were discussed with the patient and how to seek care for testing (follow up with PCP or arrange E-visit).  The importance of social distancing was discussed today.  Patient Risk:   After full review of this patients clinical status, I feel that they are at least moderate risk at this time.  Time:   Today, I have spent 12 minutes/ seconds with the patient with telehealth technology discussing above diagnoses.     Medication Adjustments/Labs and Tests Ordered: Current medicines are reviewed at length with the patient today.  Concerns regarding medicines are outlined above.   Tests Ordered: No orders of the defined types were placed in this encounter.   Medication Changes: Meds ordered this encounter  Medications  . Semaglutide (RYBELSUS) 7 MG TABS    Sig: Take one tablet by mouth 30 minutes before breakfast    Dispense:  30 tablet    Refill:  2    Disposition:  Follow up in 6 week(s)  Signed, Minette Brine, FNP

## 2020-02-15 ENCOUNTER — Encounter: Payer: Self-pay | Admitting: Internal Medicine

## 2020-02-16 ENCOUNTER — Other Ambulatory Visit: Payer: Self-pay

## 2020-02-16 ENCOUNTER — Encounter: Payer: Self-pay | Admitting: Nurse Practitioner

## 2020-02-16 ENCOUNTER — Telehealth (INDEPENDENT_AMBULATORY_CARE_PROVIDER_SITE_OTHER): Payer: 59 | Admitting: Nurse Practitioner

## 2020-02-16 ENCOUNTER — Encounter: Payer: Self-pay | Admitting: Internal Medicine

## 2020-02-16 ENCOUNTER — Telehealth: Payer: Self-pay

## 2020-02-16 VITALS — Ht 62.4 in

## 2020-02-16 DIAGNOSIS — R059 Cough, unspecified: Secondary | ICD-10-CM

## 2020-02-16 DIAGNOSIS — R05 Cough: Secondary | ICD-10-CM

## 2020-02-16 DIAGNOSIS — R7303 Prediabetes: Secondary | ICD-10-CM

## 2020-02-16 MED ORDER — METFORMIN HCL ER 750 MG PO TB24: 750.0000 mg | ORAL_TABLET | Freq: Every day | ORAL | 11 refills | Status: DC

## 2020-02-16 MED ORDER — PREDNISONE 10 MG (21) PO TBPK: ORAL_TABLET | ORAL | 0 refills | Status: DC

## 2020-02-16 MED ORDER — AZITHROMYCIN 250 MG PO TABS: ORAL_TABLET | ORAL | 0 refills | Status: AC

## 2020-02-16 MED ORDER — ALBUTEROL SULFATE HFA 108 (90 BASE) MCG/ACT IN AERS: 2.0000 | INHALATION_SPRAY | Freq: Four times a day (QID) | RESPIRATORY_TRACT | 2 refills | Status: DC | PRN

## 2020-02-16 NOTE — Progress Notes (Signed)
  This visit occurred during the SARS-CoV-2 public health emergency.  Safety protocols were in place, including screening questions prior to the visit, additional usage of staff PPE, and extensive cleaning of exam room while observing appropriate contact time as indicated for disinfecting solutions.  Subjective:     Patient ID: Virginia Mcdonald , female    DOB: 11-Dec-1975 , 44 y.o.   MRN: [id number]   Chief Complaint  Patient presents with  . Cough    HPI  Cough This is a new problem. The current episode started in the past 7 days. Associated symptoms include chills and wheezing. Pertinent negatives include no chest pain, headaches, nasal congestion, postnasal drip or sore throat. She has tried OTC cough suppressant (took 2 tylenol. She is not taking anything for allergies.  ) for the symptoms. There is no history of asthma.     Past Medical History:  Diagnosis Date  . Diet-controlled diabetes mellitus ([place])      Family History  Problem Relation Age of Onset  . Diabetes Mother   . Hypertension Mother   . Hypertension Father   . Hypertension Sister   . Diabetes Sister   . Healthy Brother   . Healthy Daughter   . Healthy Brother   . Healthy Brother   . Healthy Daughter   . Healthy Daughter      Current Outpatient Medications:  .  ACCU-CHEK FASTCLIX LANCETS MISC, Use as directed to check blood sugars 1 time per day dx: e11.65, Disp: 50 each, Rfl: 11 .  CALCIUM PO, Take by mouth. Herbal life, Disp: , Rfl:  .  Cholecalciferol (VITAMIN D PO), Take 2,000 Units by mouth. , Disp: , Rfl:  .  glucose blood (ACCU-CHEK GUIDE) test strip, Use as instructed to check blood sugars 1 time per day dx: e11.65, Disp: 50 each, Rfl: 11 .  metFORMIN (GLUCOPHAGE) 500 MG tablet, Take 1 tablet (500 mg total) by mouth daily., Disp: 90 tablet, Rfl: 0   Allergies  Allergen Reactions  . Penicillins   . Procardia [Nifedipine] Palpitations     Review of Systems  Constitutional: Positive for  chills.  HENT: Negative for postnasal drip and sore throat.   Respiratory: Positive for cough and wheezing.   Cardiovascular: Negative for chest pain.  Neurological: Negative for headaches.     Today's Vitals   02/16/20 0829  Height: 5' 2.4" (1.585 m)   Body mass index is 25.46 kg/m.   Objective:  Physical Exam      Assessment And Plan:     There are no diagnoses linked to this encounter.       Minette Brine, FNP    THE PATIENT IS ENCOURAGED TO PRACTICE SOCIAL DISTANCING DUE TO THE COVID-19 PANDEMIC.

## 2020-02-16 NOTE — Progress Notes (Signed)
Virtual Visit via Telephone - she was unable to open video on her phone    This visit type was conducted due to national recommendations for restrictions regarding the COVID-19 Pandemic (e.g. social distancing) in an effort to limit this patient's exposure and mitigate transmission in our community.  Due to her co-morbid illnesses, this patient is at least at moderate risk for complications without adequate follow up.  This format is felt to be most appropriate for this patient at this time.  All issues noted in this document were discussed and addressed.  A limited physical exam was performed with this format.    This visit type was conducted due to national recommendations for restrictions regarding the COVID-19 Pandemic (e.g. social distancing) in an effort to limit this patient's exposure and mitigate transmission in our community.  Patients identity confirmed using two different identifiers.  This format is felt to be most appropriate for this patient at this time.  All issues noted in this document were discussed and addressed.  No physical exam was performed (except for noted visual exam findings with Video Visits).    Date:  02/16/2020   ID:  Virginia Mcdonald, Virginia Mcdonald [birth year], MRN [id number]  Patient Location:  Car - spoke with Delice Lesch  Provider location:   Office    Chief Complaint:  cough  History of Present Illness:    Virginia Mcdonald is a 44 y.o. female who presents via video conferencing for a telehealth visit today.    The patient does have symptoms concerning for COVID-19 infection (fever, chills, cough, or new shortness of breath).   She reports she has used an inhaler in the past but not recently.  She is planning to go out of the country. She is leaving Tuesday.   Cough This is a new problem. The current episode started in the past 7 days. Associated symptoms include chills and wheezing. Pertinent negatives include no fever, headaches, nasal  congestion, postnasal drip or shortness of breath. She has tried OTC cough suppressant for the symptoms. There is no history of asthma.     Past Medical History:  Diagnosis Date  . Diet-controlled diabetes mellitus ([place])    Past Surgical History:  Procedure Laterality Date  . CESAREAN SECTION       Current Meds  Medication Sig  . ACCU-CHEK FASTCLIX LANCETS MISC Use as directed to check blood sugars 1 time per day dx: e11.65  . CALCIUM PO Take by mouth. Herbal life  . Cholecalciferol (VITAMIN D PO) Take 2,000 Units by mouth.   Marland Kitchen glucose blood (ACCU-CHEK GUIDE) test strip Use as instructed to check blood sugars 1 time per day dx: e11.65  . [DISCONTINUED] metFORMIN (GLUCOPHAGE) 500 MG tablet Take 1 tablet (500 mg total) by mouth daily.     Allergies:   Penicillins and Procardia [nifedipine]   Social History   Tobacco Use  . Smoking status: Never Smoker  . Smokeless tobacco: Never Used  Vaping Use  . Vaping Use: Never used  Substance Use Topics  . Alcohol use: No  . Drug use: No     Family Hx: The patient's family history includes Diabetes in her mother and sister; Healthy in her brother, brother, brother, daughter, daughter, and daughter; Hypertension in her father, mother, and sister.  ROS:   Please see the history of present illness.    Review of Systems  Constitutional: Positive for chills. Negative for fever.  HENT: Negative for postnasal drip.   Respiratory: Positive  for cough and wheezing. Negative for shortness of breath.   Neurological: Negative for headaches.    All other systems reviewed and are negative.   Labs/Other Tests and Data Reviewed:    Recent Labs: 09/08/2019: ALT 17 11/01/2019: BUN 10; Creatinine, Ser 0.76; Hemoglobin 12.9; Platelets 149; Potassium 4.4; Sodium 136   Recent Lipid Panel Lab Results  Component Value Date/Time   CHOL 202 (H) 12/13/2019 12:30 PM   TRIG 107 12/13/2019 12:30 PM   HDL 59 12/13/2019 12:30 PM   CHOLHDL 3.4  12/13/2019 12:30 PM   LDLCALC 124 (H) 12/13/2019 12:30 PM    Wt Readings from Last 3 Encounters:  12/13/19 141 lb (64 kg)  11/01/19 144 lb 9.6 oz (65.6 kg)  09/12/19 140 lb 6.4 oz (63.7 kg)     Exam:    Vital Signs:  Ht 5' 2.4" (1.585 m)   LMP  (LMP Unknown)   BMI 25.46 kg/m     Physical Exam Pulmonary:     Effort: Pulmonary effort is normal.  Neurological:     General: No focal deficit present.     Mental Status: She is alert and oriented to person, place, and time.  Psychiatric:        Mood and Affect: Mood normal.        Behavior: Behavior normal.        Thought Content: Thought content normal.        Judgment: Judgment normal.     ASSESSMENT & PLAN:    1. Cough  This sounds like can be allergy related however with the chills would be concerned viral vs infectious  Sent RX for albuterol inhaler  - albuterol (VENTOLIN HFA) 108 (90 Base) MCG/ACT inhaler; Inhale 2 puffs into the lungs every 6 (six) hours as needed for wheezing or shortness of breath.  Dispense: 18 g; Refill: 2 - azithromycin (ZITHROMAX) 250 MG tablet; Take 2 tablets (500 mg) on  Day 1,  followed by 1 tablet (250 mg) once daily on Days 2 through 5.  Dispense: 6 each; Refill: 0 - predniSONE (STERAPRED UNI-PAK 21 TAB) 10 MG (21) TBPK tablet; Take as directed  Dispense: 21 tablet; Refill: 0  2. Prediabetes  Regular metformin is causing her constipation, will try XR and advised to take a stool softener MWF  Also encouraged to stay well hydrated with water - metFORMIN (GLUCOPHAGE XR) 750 MG 24 hr tablet; Take 1 tablet (750 mg total) by mouth daily with breakfast.  Dispense: 30 tablet; Refill: 11   COVID-19 Education: The signs and symptoms of COVID-19 were discussed with the patient and how to seek care for testing (follow up with PCP or arrange E-visit).  The importance of social distancing was discussed today.  Patient Risk:   After full review of this patients clinical status, I feel that they are  at least moderate risk at this time.  Time:   Today, I have spent 11 minutes/ seconds with the patient with telehealth technology discussing above diagnoses.     Medication Adjustments/Labs and Tests Ordered: Current medicines are reviewed at length with the patient today.  Concerns regarding medicines are outlined above.   Tests Ordered: No orders of the defined types were placed in this encounter.   Medication Changes: Meds ordered this encounter  Medications  . albuterol (VENTOLIN HFA) 108 (90 Base) MCG/ACT inhaler    Sig: Inhale 2 puffs into the lungs every 6 (six) hours as needed for wheezing or shortness of breath.  Dispense:  18 g    Refill:  2  . azithromycin (ZITHROMAX) 250 MG tablet    Sig: Take 2 tablets (500 mg) on  Day 1,  followed by 1 tablet (250 mg) once daily on Days 2 through 5.    Dispense:  6 each    Refill:  0  . predniSONE (STERAPRED UNI-PAK 21 TAB) 10 MG (21) TBPK tablet    Sig: Take as directed    Dispense:  21 tablet    Refill:  0  . metFORMIN (GLUCOPHAGE XR) 750 MG 24 hr tablet    Sig: Take 1 tablet (750 mg total) by mouth daily with breakfast.    Dispense:  30 tablet    Refill:  11    Disposition:  Follow up prn  Signed, Minette Brine, FNP

## 2020-02-16 NOTE — Telephone Encounter (Signed)
The pt gave verbal consent for mychart visit.

## 2020-03-26 ENCOUNTER — Encounter: Payer: Self-pay | Admitting: Internal Medicine

## 2020-03-26 ENCOUNTER — Ambulatory Visit: Payer: 59 | Admitting: Internal Medicine

## 2020-03-27 ENCOUNTER — Ambulatory Visit: Payer: Self-pay | Admitting: Nurse Practitioner

## 2020-04-10 ENCOUNTER — Encounter: Payer: Self-pay | Admitting: Nurse Practitioner

## 2020-04-10 ENCOUNTER — Other Ambulatory Visit: Payer: Self-pay

## 2020-04-10 ENCOUNTER — Ambulatory Visit (INDEPENDENT_AMBULATORY_CARE_PROVIDER_SITE_OTHER): Payer: 59 | Admitting: Nurse Practitioner

## 2020-04-10 VITALS — BP 110/70 | HR 80 | Temp 98.1°F | Wt 138.6 lb

## 2020-04-10 DIAGNOSIS — Z8632 Personal history of gestational diabetes: Secondary | ICD-10-CM

## 2020-04-10 DIAGNOSIS — E1165 Type 2 diabetes mellitus with hyperglycemia: Secondary | ICD-10-CM

## 2020-04-10 DIAGNOSIS — E7800 Pure hypercholesterolemia, unspecified: Secondary | ICD-10-CM | POA: Diagnosis not present

## 2020-04-10 DIAGNOSIS — R7303 Prediabetes: Secondary | ICD-10-CM

## 2020-04-10 MED ORDER — METFORMIN HCL 500 MG PO TABS: 500.0000 mg | ORAL_TABLET | Freq: Every day | ORAL | 2 refills | Status: DC

## 2020-04-10 NOTE — Progress Notes (Signed)
This visit occurred during the SARS-CoV-2 public health emergency.  Safety protocols were in place, including screening questions prior to the visit, additional usage of staff PPE, and extensive cleaning of exam room while observing appropriate contact time as indicated for disinfecting solutions.  Subjective:     Patient ID: Virginia Mcdonald , female    DOB: 1976/06/13 , 44 y.o.   MRN: [id number]   Chief Complaint  Patient presents with  . prediabtes    HPI  Here for prediabetes follow up.  She continues to report having nausea and vomiting, she does not have an appetite. She is restless at night.   She continues to have constipation. She reports she walks 12,000 steps a day.  She has been using a tea to help her to have a bowel movement.    Wt Readings from Last 3 Encounters: 04/10/20 : 138 lb 9.6 oz (62.9 kg) 12/13/19 : 141 lb (64 kg) 11/01/19 : 144 lb 9.6 oz (65.6 kg)     Past Medical History:  Diagnosis Date  . Diet-controlled diabetes mellitus ([place])      Family History  Problem Relation Age of Onset  . Diabetes Mother   . Hypertension Mother   . Hypertension Father   . Hypertension Sister   . Diabetes Sister   . Healthy Brother   . Healthy Daughter   . Healthy Brother   . Healthy Brother   . Healthy Daughter   . Healthy Daughter      Current Outpatient Medications:  .  ACCU-CHEK FASTCLIX LANCETS MISC, Use as directed to check blood sugars 1 time per day dx: e11.65, Disp: 50 each, Rfl: 11 .  CALCIUM PO, Take by mouth. Herbal life, Disp: , Rfl:  .  Cholecalciferol (VITAMIN D PO), Take 2,000 Units by mouth. , Disp: , Rfl:  .  glucose blood (ACCU-CHEK GUIDE) test strip, Use as instructed to check blood sugars 1 time per day dx: e11.65, Disp: 50 each, Rfl: 11 .  metFORMIN (GLUCOPHAGE) 500 MG tablet, Take 1 tablet (500 mg total) by mouth daily with breakfast., Disp: 30 tablet, Rfl: 2   Allergies  Allergen Reactions  . Penicillins   . Procardia [Nifedipine]  Palpitations     Review of Systems  Constitutional: Negative.   Respiratory: Negative.  Negative for cough.   Cardiovascular: Negative.  Negative for chest pain, palpitations and leg swelling.  Neurological: Negative for dizziness and headaches.  Psychiatric/Behavioral: Negative.      Today's Vitals   04/10/20 1129  BP: 110/70  Pulse: 80  Temp: 98.1 F (36.7 C)  TempSrc: Oral  Weight: 138 lb 9.6 oz (62.9 kg)  PainSc: 0-No pain   Body mass index is 25.03 kg/m.   Objective:  Physical Exam Constitutional:      General: She is not in acute distress.    Appearance: Normal appearance.  Cardiovascular:     Rate and Rhythm: Normal rate and regular rhythm.     Pulses: Normal pulses.     Heart sounds: Normal heart sounds. No murmur heard.   Pulmonary:     Effort: Pulmonary effort is normal. No respiratory distress.  Neurological:     General: No focal deficit present.     Mental Status: She is alert and oriented to person, place, and time.     Cranial Nerves: No cranial nerve deficit.  Psychiatric:        Mood and Affect: Mood normal.        Behavior: Behavior  normal.        Thought Content: Thought content normal.        Judgment: Judgment normal.         Assessment And Plan:     1. Elevated cholesterol Chronic, stable Will check lipid panel A low fat, low cholesterol is discussed with the patient  - Lipid panel  2. Uncontrolled type 2 diabetes mellitus with hyperglycemia ([place]) Hgba1c is increasing slowly She is not tolerating the XR and would like to go back to the 500 mg daily - Hemoglobin A1c - TSH - metFORMIN (GLUCOPHAGE) 500 MG tablet; Take 1 tablet (500 mg total) by mouth daily with breakfast.  Dispense: 30 tablet; Refill: 2  3. History of gestational diabetes - Hemoglobin A1c   She is getting a lipo b injection.   Patient was given opportunity to ask questions. Patient verbalized understanding of the plan and was able to repeat key elements of the  plan. All questions were answered to their satisfaction.  Minette Brine, FNP   I, Minette Brine, FNP, have reviewed all documentation for this visit. The documentation on 05/03/20 for the exam, diagnosis, procedures, and orders are all accurate and complete.  THE PATIENT IS ENCOURAGED TO PRACTICE SOCIAL DISTANCING DUE TO THE COVID-19 PANDEMIC.

## 2020-04-10 NOTE — Patient Instructions (Addendum)
Semaglutide injection solution (this is the generic name of Ozempic) What is this medicine? SEMAGLUTIDE (Sem a GLOO tide) is used to improve blood sugar control in adults with type 2 diabetes. This medicine may be used with other diabetes medicines. This drug may also reduce the risk of heart attack or stroke if you have type 2 diabetes and risk factors for heart disease. This medicine may be used for other purposes; ask your health care provider or pharmacist if you have questions. COMMON BRAND NAME(S): OZEMPIC What should I tell my health care provider before I take this medicine? They need to know if you have any of these conditions:  endocrine tumors (MEN 2) or if someone in your family had these tumors  eye disease, vision problems  history of pancreatitis  kidney disease  stomach problems  thyroid cancer or if someone in your family had thyroid cancer  an unusual or allergic reaction to semaglutide, other medicines, foods, dyes, or preservatives  pregnant or trying to get pregnant  breast-feeding How should I use this medicine? This medicine is for injection under the skin of your upper leg (thigh), stomach area, or upper arm. It is given once every week (every 7 days). You will be taught how to prepare and give this medicine. Use exactly as directed. Take your medicine at regular intervals. Do not take it more often than directed. If you use this medicine with insulin, you should inject this medicine and the insulin separately. Do not mix them together. Do not give the injections right next to each other. Change (rotate) injection sites with each injection. It is important that you put your used needles and syringes in a special sharps container. Do not put them in a trash can. If you do not have a sharps container, call your pharmacist or healthcare provider to get one. A special MedGuide will be given to you by the pharmacist with each prescription and refill. Be sure to read  this information carefully each time. This drug comes with INSTRUCTIONS FOR USE. Ask your pharmacist for directions on how to use this drug. Read the information carefully. Talk to your pharmacist or health care provider if you have questions. Talk to your pediatrician regarding the use of this medicine in children. Special care may be needed. Overdosage: If you think you have taken too much of this medicine contact a poison control center or emergency room at once. NOTE: This medicine is only for you. Do not share this medicine with others. What if I miss a dose? If you miss a dose, take it as soon as you can within 5 days after the missed dose. Then take your next dose at your regular weekly time. If it has been longer than 5 days after the missed dose, do not take the missed dose. Take the next dose at your regular time. Do not take double or extra doses. If you have questions about a missed dose, contact your health care provider for advice. What may interact with this medicine?  other medicines for diabetes Many medications may cause changes in blood sugar, these include:  alcohol containing beverages  antiviral medicines for HIV or AIDS  aspirin and aspirin-like drugs  certain medicines for blood pressure, heart disease, irregular heart beat  chromium  diuretics  female hormones, such as estrogens or progestins, birth control pills  fenofibrate  gemfibrozil  isoniazid  lanreotide  female hormones or anabolic steroids  MAOIs like Carbex, Eldepryl, Marplan, Nardil, and Parnate  medicines for weight loss  medicines for allergies, asthma, cold, or cough  medicines for depression, anxiety, or psychotic disturbances  niacin  nicotine  NSAIDs, medicines for pain and inflammation, like ibuprofen or naproxen  octreotide  pasireotide  pentamidine  phenytoin  probenecid  quinolone antibiotics such as ciprofloxacin, levofloxacin, ofloxacin  some herbal dietary  supplements  steroid medicines such as prednisone or cortisone  sulfamethoxazole; trimethoprim  thyroid hormones Some medications can hide the warning symptoms of low blood sugar (hypoglycemia). You may need to monitor your blood sugar more closely if you are taking one of these medications. These include:  beta-blockers, often used for high blood pressure or heart problems (examples include atenolol, metoprolol, propranolol)  clonidine  guanethidine  reserpine This list may not describe all possible interactions. Give your health care provider a list of all the medicines, herbs, non-prescription drugs, or dietary supplements you use. Also tell them if you smoke, drink alcohol, or use illegal drugs. Some items may interact with your medicine. What should I watch for while using this medicine? Visit your doctor or health care professional for regular checks on your progress. Drink plenty of fluids while taking this medicine. Check with your doctor or health care professional if you get an attack of severe diarrhea, nausea, and vomiting. The loss of too much body fluid can make it dangerous for you to take this medicine. A test called the HbA1C (A1C) will be monitored. This is a simple blood test. It measures your blood sugar control over the last 2 to 3 months. You will receive this test every 3 to 6 months. Learn how to check your blood sugar. Learn the symptoms of low and high blood sugar and how to manage them. Always carry a quick-source of sugar with you in case you have symptoms of low blood sugar. Examples include hard sugar candy or glucose tablets. Make sure others know that you can choke if you eat or drink when you develop serious symptoms of low blood sugar, such as seizures or unconsciousness. They must get medical help at once. Tell your doctor or health care professional if you have high blood sugar. You might need to change the dose of your medicine. If you are sick or  exercising more than usual, you might need to change the dose of your medicine. Do not skip meals. Ask your doctor or health care professional if you should avoid alcohol. Many nonprescription cough and cold products contain sugar or alcohol. These can affect blood sugar. Pens should never be shared. Even if the needle is changed, sharing may result in passing of viruses like hepatitis or HIV. Wear a medical ID bracelet or chain, and carry a card that describes your disease and details of your medicine and dosage times. Do not become pregnant while taking this medicine. Women should inform their doctor if they wish to become pregnant or think they might be pregnant. There is a potential for serious side effects to an unborn child. Talk to your health care professional or pharmacist for more information. What side effects may I notice from receiving this medicine? Side effects that you should report to your doctor or health care professional as soon as possible:  allergic reactions like skin rash, itching or hives, swelling of the face, lips, or tongue  breathing problems  changes in vision  diarrhea that continues or is severe  lump or swelling on the neck  severe nausea  signs and symptoms of infection like fever or  chills; cough; sore throat; pain or trouble passing urine  signs and symptoms of low blood sugar such as feeling anxious, confusion, dizziness, increased hunger, unusually weak or tired, sweating, shakiness, cold, irritable, headache, blurred vision, fast heartbeat, loss of consciousness  signs and symptoms of kidney injury like trouble passing urine or change in the amount of urine  trouble swallowing  unusual stomach upset or pain  vomiting Side effects that usually do not require medical attention (report to your doctor or health care professional if they continue or are bothersome):  constipation  diarrhea  nausea  pain, redness, or irritation at site where  injected  stomach upset This list may not describe all possible side effects. Call your doctor for medical advice about side effects. You may report side effects to FDA at 1-800-FDA-1088. Where should I keep my medicine? Keep out of the reach of children. Store unopened pens in a refrigerator between 2 and 8 degrees C (36 and 46 degrees F). Do not freeze. Protect from light and heat. After you first use the pen, it can be stored for 56 days at room temperature between 15 and 30 degrees C (59 and 86 degrees F) or in a refrigerator. Throw away your used pen after 56 days or after the expiration date, whichever comes first. Do not store your pen with the needle attached. If the needle is left on, medicine may leak from the pen. NOTE: This sheet is a summary. It may not cover all possible information. If you have questions about this medicine, talk to your doctor, pharmacist, or health care provider.  2020 Elsevier/Gold Standard (2019-05-10 09:41:51)

## 2020-04-11 LAB — LIPID PANEL
Chol/HDL Ratio: 3.5 ratio (ref 0.0–4.4)
Cholesterol, Total: 204 mg/dL — ABNORMAL HIGH (ref 100–199)
HDL: 58 mg/dL (ref >39)
LDL Chol Calc (NIH): 129 mg/dL — ABNORMAL HIGH (ref 0–99)
Triglycerides: 97 mg/dL (ref 0–149)
VLDL Cholesterol Cal: 17 mg/dL (ref 5–40)

## 2020-04-11 LAB — HEMOGLOBIN A1C
Est. average glucose Bld gHb Est-mCnc: 146 mg/dL
Hgb A1c MFr Bld: 6.7 % — ABNORMAL HIGH (ref 4.8–5.6)

## 2020-04-11 LAB — TSH: TSH: 1.35 u[IU]/mL (ref 0.450–4.500)

## 2020-04-29 ENCOUNTER — Encounter: Payer: Self-pay | Admitting: Nurse Practitioner

## 2020-05-29 ENCOUNTER — Ambulatory Visit: Payer: 59

## 2020-05-29 ENCOUNTER — Other Ambulatory Visit: Payer: Self-pay

## 2020-06-09 ENCOUNTER — Encounter: Payer: Self-pay | Admitting: Internal Medicine

## 2020-06-12 ENCOUNTER — Ambulatory Visit (INDEPENDENT_AMBULATORY_CARE_PROVIDER_SITE_OTHER): Payer: 59

## 2020-06-12 ENCOUNTER — Other Ambulatory Visit: Payer: Self-pay

## 2020-06-12 VITALS — BP 118/64 | HR 85 | Temp 97.6°F | Ht 62.4 in | Wt 137.0 lb

## 2020-06-12 DIAGNOSIS — Z23 Encounter for immunization: Secondary | ICD-10-CM | POA: Diagnosis not present

## 2020-06-12 NOTE — Progress Notes (Signed)
Pt is here today for a flu shot

## 2020-07-05 ENCOUNTER — Other Ambulatory Visit: Payer: Self-pay

## 2020-07-05 ENCOUNTER — Other Ambulatory Visit: Payer: 59

## 2020-07-05 DIAGNOSIS — Z0000 Encounter for general adult medical examination without abnormal findings: Secondary | ICD-10-CM

## 2020-07-05 DIAGNOSIS — E559 Vitamin D deficiency, unspecified: Secondary | ICD-10-CM

## 2020-07-06 ENCOUNTER — Encounter: Payer: Self-pay | Admitting: Internal Medicine

## 2020-07-06 LAB — CMP14+EGFR
ALT: 9 IU/L (ref 0–32)
AST: 16 IU/L (ref 0–40)
Albumin/Globulin Ratio: 1.3 (ref 1.2–2.2)
Albumin: 4.3 g/dL (ref 3.8–4.8)
Alkaline Phosphatase: 70 IU/L (ref 44–121)
BUN/Creatinine Ratio: 9 (ref 9–23)
BUN: 8 mg/dL (ref 6–24)
Bilirubin Total: 0.2 mg/dL (ref 0.0–1.2)
CO2: 23 mmol/L (ref 20–29)
Calcium: 9.4 mg/dL (ref 8.7–10.2)
Chloride: 104 mmol/L (ref 96–106)
Creatinine, Ser: 0.86 mg/dL (ref 0.57–1.00)
GFR calc Af Amer: 96 mL/min/{1.73_m2} (ref >59)
GFR calc non Af Amer: 83 mL/min/{1.73_m2} (ref >59)
Globulin, Total: 3.3 g/dL (ref 1.5–4.5)
Glucose: 123 mg/dL — ABNORMAL HIGH (ref 65–99)
Potassium: 4.1 mmol/L (ref 3.5–5.2)
Sodium: 139 mmol/L (ref 134–144)
Total Protein: 7.6 g/dL (ref 6.0–8.5)

## 2020-07-06 LAB — HEMOGLOBIN A1C
Est. average glucose Bld gHb Est-mCnc: 146 mg/dL
Hgb A1c MFr Bld: 6.7 % — ABNORMAL HIGH (ref 4.8–5.6)

## 2020-07-06 LAB — CBC
Hematocrit: 37.7 % (ref 34.0–46.6)
Hemoglobin: 12.3 g/dL (ref 11.1–15.9)
MCH: 27.6 pg (ref 26.6–33.0)
MCHC: 32.6 g/dL (ref 31.5–35.7)
MCV: 85 fL (ref 79–97)
Platelets: 164 10*3/uL (ref 150–450)
RBC: 4.45 x10E6/uL (ref 3.77–5.28)
RDW: 14.9 % (ref 11.7–15.4)
WBC: 4.4 10*3/uL (ref 3.4–10.8)

## 2020-07-06 LAB — LIPID PANEL
Chol/HDL Ratio: 3.8 ratio (ref 0.0–4.4)
Cholesterol, Total: 212 mg/dL — ABNORMAL HIGH (ref 100–199)
HDL: 56 mg/dL (ref >39)
LDL Chol Calc (NIH): 144 mg/dL — ABNORMAL HIGH (ref 0–99)
Triglycerides: 69 mg/dL (ref 0–149)
VLDL Cholesterol Cal: 12 mg/dL (ref 5–40)

## 2020-07-06 LAB — VITAMIN D 25 HYDROXY (VIT D DEFICIENCY, FRACTURES): Vit D, 25-Hydroxy: 62.3 ng/mL (ref 30.0–100.0)

## 2020-07-10 ENCOUNTER — Ambulatory Visit (INDEPENDENT_AMBULATORY_CARE_PROVIDER_SITE_OTHER): Payer: 59 | Admitting: Internal Medicine

## 2020-07-10 ENCOUNTER — Encounter: Payer: Self-pay | Admitting: Internal Medicine

## 2020-07-10 ENCOUNTER — Other Ambulatory Visit: Payer: Self-pay

## 2020-07-10 VITALS — BP 110/76 | HR 76 | Temp 98.0°F | Ht 62.4 in | Wt 135.4 lb

## 2020-07-10 DIAGNOSIS — E7800 Pure hypercholesterolemia, unspecified: Secondary | ICD-10-CM

## 2020-07-10 DIAGNOSIS — E1165 Type 2 diabetes mellitus with hyperglycemia: Secondary | ICD-10-CM

## 2020-07-10 NOTE — Progress Notes (Signed)
I,Katawbba Wiggins,acting as a Education administrator for Maximino Greenland, MD.,have documented all relevant documentation on the behalf of Maximino Greenland, MD,as directed by  Maximino Greenland, MD while in the presence of Maximino Greenland, MD.  This visit occurred during the SARS-CoV-2 public health emergency.  Safety protocols were in place, including screening questions prior to the visit, additional usage of staff PPE, and extensive cleaning of exam room while observing appropriate contact time as indicated for disinfecting solutions.  Subjective:     Patient ID: Virginia Mcdonald , female    DOB: [birth year] , 44 y.o.   MRN: [id number]   Chief Complaint  Patient presents with  . Diabetes    HPI  She is here today for f/u diabetes.  She was started on metformin by Doreene Burke, DNP. However, she states she did not tolerate the medication. It caused her to have n/v. She does not wish to take another medication.     Past Medical History:  Diagnosis Date  . Diet-controlled diabetes mellitus ([place])      Family History  Problem Relation Age of Onset  . Diabetes Mother   . Hypertension Mother   . Hypertension Father   . Hypertension Sister   . Diabetes Sister   . Healthy Brother   . Healthy Daughter   . Healthy Brother   . Healthy Brother   . Healthy Daughter   . Healthy Daughter      Current Outpatient Medications:  .  ACCU-CHEK FASTCLIX LANCETS MISC, Use as directed to check blood sugars 1 time per day dx: e11.65, Disp: 50 each, Rfl: 11 .  CALCIUM PO, Take by mouth. Herbal life, Disp: , Rfl:  .  Cholecalciferol (VITAMIN D PO), Take 2,000 Units by mouth. , Disp: , Rfl:  .  glucose blood (ACCU-CHEK GUIDE) test strip, Use as instructed to check blood sugars 1 time per day dx: e11.65, Disp: 50 each, Rfl: 11   Allergies  Allergen Reactions  . Penicillins   . Procardia [Nifedipine] Palpitations     Review of Systems  Constitutional: Negative.   Respiratory: Negative.   Cardiovascular:  Negative.   Gastrointestinal: Negative.   Psychiatric/Behavioral: Negative.   All other systems reviewed and are negative.    Today's Vitals   07/10/20 1051  BP: 110/76  Pulse: 76  Temp: 98 F (36.7 C)  TempSrc: Oral  Weight: 135 lb 6.4 oz (61.4 kg)  Height: 5' 2.4" (1.585 m)   Body mass index is 24.45 kg/m.  Wt Readings from Last 3 Encounters:  07/10/20 135 lb 6.4 oz (61.4 kg)  06/12/20 137 lb (62.1 kg)  04/10/20 138 lb 9.6 oz (62.9 kg)   Objective:  Physical Exam Vitals and nursing note reviewed.  Constitutional:      Appearance: Normal appearance.  HENT:     Head: Normocephalic and atraumatic.  Cardiovascular:     Rate and Rhythm: Normal rate and regular rhythm.     Heart sounds: Normal heart sounds.  Pulmonary:     Breath sounds: Normal breath sounds.  Skin:    General: Skin is warm.  Neurological:     General: No focal deficit present.     Mental Status: She is alert and oriented to person, place, and time.         Assessment And Plan:     1. Uncontrolled type 2 diabetes mellitus with hyperglycemia ([place]) Comments: She had her labs drawn last week, thinking her physical was today. Her a1c is  6.7. She has not been able to tolerate metformin.  She does not wish to take rx meds if possible. She will need to start meds if her a1c goes above 7.0.  I will consider Wilder Glade if her a1c goes up. Importance of dietary compliance was discussed with the patient.   2. Pure hypercholesterolemia Comments: Chronic, LDL 144. Pt advised goal is less than 70.  She does not wish to start rx meds. She prefers to consider natural supplement.  She will start Lipid Factors 2 capsules daily. If needed, will increase to 2 caps twice daily. Also advised to avoid fried foods and exercise at least 150 minutes per week.      Patient was given opportunity to ask questions. Patient verbalized understanding of the plan and was able to repeat key elements of the plan. All questions were  answered to their satisfaction.  Maximino Greenland, MD   I, Maximino Greenland, MD, have reviewed all documentation for this visit. The documentation on 07/15/20 for the exam, diagnosis, procedures, and orders are all accurate and complete.  THE PATIENT IS ENCOURAGED TO PRACTICE SOCIAL DISTANCING DUE TO THE COVID-19 PANDEMIC.

## 2020-07-10 NOTE — Patient Instructions (Signed)
Diabetes Mellitus and Foot Care Foot care is an important part of your health, especially when you have diabetes. Diabetes may cause you to have problems because of poor blood flow (circulation) to your feet and legs, which can cause your skin to:  Become thinner and drier.  Break more easily.  Heal more slowly.  Peel and crack. You may also have nerve damage (neuropathy) in your legs and feet, causing decreased feeling in them. This means that you may not notice minor injuries to your feet that could lead to more serious problems. Noticing and addressing any potential problems early is the best way to prevent future foot problems. How to care for your feet Foot hygiene  Wash your feet daily with warm water and mild soap. Do not use hot water. Then, pat your feet and the areas between your toes until they are completely dry. Do not soak your feet as this can dry your skin.  Trim your toenails straight across. Do not dig under them or around the cuticle. File the edges of your nails with an emery board or nail file.  Apply a moisturizing lotion or petroleum jelly to the skin on your feet and to dry, brittle toenails. Use lotion that does not contain alcohol and is unscented. Do not apply lotion between your toes. Shoes and socks  Wear clean socks or stockings every day. Make sure they are not too tight. Do not wear knee-high stockings since they may decrease blood flow to your legs.  Wear shoes that fit properly and have enough cushioning. Always look in your shoes before you put them on to be sure there are no objects inside.  To break in new shoes, wear them for just a few hours a day. This prevents injuries on your feet. Wounds, scrapes, corns, and calluses  Check your feet daily for blisters, cuts, bruises, sores, and redness. If you cannot see the bottom of your feet, use a mirror or ask someone for help.  Do not cut corns or calluses or try to remove them with medicine.  If you  find a minor scrape, cut, or break in the skin on your feet, keep it and the skin around it clean and dry. You may clean these areas with mild soap and water. Do not clean the area with peroxide, alcohol, or iodine.  If you have a wound, scrape, corn, or callus on your foot, look at it several times a day to make sure it is healing and not infected. Check for: ? Redness, swelling, or pain. ? Fluid or blood. ? Warmth. ? Pus or a bad smell. General instructions  Do not cross your legs. This may decrease blood flow to your feet.  Do not use heating pads or hot water bottles on your feet. They may burn your skin. If you have lost feeling in your feet or legs, you may not know this is happening until it is too late.  Protect your feet from hot and cold by wearing shoes, such as at the beach or on hot pavement.  Schedule a complete foot exam at least once a year (annually) or more often if you have foot problems. If you have foot problems, report any cuts, sores, or bruises to your health care provider immediately. Contact a health care provider if:  You have a medical condition that increases your risk of infection and you have any cuts, sores, or bruises on your feet.  You have an injury that is not   healing.  You have redness on your legs or feet.  You feel burning or tingling in your legs or feet.  You have pain or cramps in your legs and feet.  Your legs or feet are numb.  Your feet always feel cold.  You have pain around a toenail. Get help right away if:  You have a wound, scrape, corn, or callus on your foot and: ? You have pain, swelling, or redness that gets worse. ? You have fluid or blood coming from the wound, scrape, corn, or callus. ? Your wound, scrape, corn, or callus feels warm to the touch. ? You have pus or a bad smell coming from the wound, scrape, corn, or callus. ? You have a fever. ? You have a red line going up your leg. Summary  Check your feet every day  for cuts, sores, red spots, swelling, and blisters.  Moisturize feet and legs daily.  Wear shoes that fit properly and have enough cushioning.  If you have foot problems, report any cuts, sores, or bruises to your health care provider immediately.  Schedule a complete foot exam at least once a year (annually) or more often if you have foot problems. This information is not intended to replace advice given to you by your health care provider. Make sure you discuss any questions you have with your health care provider. Document Revised: 05/18/2019 Document Reviewed: 09/26/2016 Elsevier Patient Education  2020 Elsevier Inc.  

## 2020-07-11 ENCOUNTER — Ambulatory Visit: Payer: 59 | Admitting: Internal Medicine

## 2020-07-17 ENCOUNTER — Other Ambulatory Visit: Payer: Self-pay | Admitting: Obstetrics and Gynecology

## 2020-07-31 ENCOUNTER — Encounter: Payer: Self-pay | Admitting: Internal Medicine

## 2020-07-31 ENCOUNTER — Other Ambulatory Visit: Payer: Self-pay

## 2020-08-24 ENCOUNTER — Encounter: Payer: Self-pay | Admitting: Internal Medicine

## 2020-09-20 ENCOUNTER — Other Ambulatory Visit: Payer: Self-pay

## 2020-09-20 ENCOUNTER — Ambulatory Visit (INDEPENDENT_AMBULATORY_CARE_PROVIDER_SITE_OTHER): Payer: 59 | Admitting: Internal Medicine

## 2020-09-20 ENCOUNTER — Encounter: Payer: Self-pay | Admitting: Internal Medicine

## 2020-09-20 VITALS — BP 126/70 | HR 89 | Temp 98.1°F | Ht 62.4 in | Wt 135.6 lb

## 2020-09-20 DIAGNOSIS — E7800 Pure hypercholesterolemia, unspecified: Secondary | ICD-10-CM

## 2020-09-20 DIAGNOSIS — N926 Irregular menstruation, unspecified: Secondary | ICD-10-CM

## 2020-09-20 DIAGNOSIS — Z0000 Encounter for general adult medical examination without abnormal findings: Secondary | ICD-10-CM | POA: Diagnosis not present

## 2020-09-20 DIAGNOSIS — E1165 Type 2 diabetes mellitus with hyperglycemia: Secondary | ICD-10-CM

## 2020-09-20 LAB — POCT URINALYSIS DIPSTICK
Bilirubin, UA: NEGATIVE
Glucose, UA: NEGATIVE
Ketones, UA: NEGATIVE
Leukocytes, UA: NEGATIVE
Nitrite, UA: NEGATIVE
Protein, UA: POSITIVE — AB
Spec Grav, UA: 1.025 (ref 1.010–1.025)
Urobilinogen, UA: 0.2 E.U./dL
pH, UA: 7 (ref 5.0–8.0)

## 2020-09-20 LAB — POCT UA - MICROALBUMIN
Albumin/Creatinine Ratio, Urine, POC: >300
Creatinine, POC: 100 mg/dL
Microalbumin Ur, POC: 150 mg/L

## 2020-09-20 NOTE — Patient Instructions (Signed)
Health Maintenance, Female Adopting a healthy lifestyle and getting preventive care are important in promoting health and wellness. Ask your health care provider about:  The right schedule for you to have regular tests and exams.  Things you can do on your own to prevent diseases and keep yourself healthy. What should I know about diet, weight, and exercise? Eat a healthy diet  Eat a diet that includes plenty of vegetables, fruits, low-fat dairy products, and lean protein.  Do not eat a lot of foods that are high in solid fats, added sugars, or sodium.   Maintain a healthy weight Body mass index (BMI) is used to identify weight problems. It estimates body fat based on height and weight. Your health care provider can help determine your BMI and help you achieve or maintain a healthy weight. Get regular exercise Get regular exercise. This is one of the most important things you can do for your health. Most adults should:  Exercise for at least 150 minutes each week. The exercise should increase your heart rate and make you sweat (moderate-intensity exercise).  Do strengthening exercises at least twice a week. This is in addition to the moderate-intensity exercise.  Spend less time sitting. Even light physical activity can be beneficial. Watch cholesterol and blood lipids Have your blood tested for lipids and cholesterol at 44 years of age, then have this test every 5 years. Have your cholesterol levels checked more often if:  Your lipid or cholesterol levels are high.  You are older than 44 years of age.  You are at high risk for heart disease. What should I know about cancer screening? Depending on your health history and family history, you may need to have cancer screening at various ages. This may include screening for:  Breast cancer.  Cervical cancer.  Colorectal cancer.  Skin cancer.  Lung cancer. What should I know about heart disease, diabetes, and high blood  pressure? Blood pressure and heart disease  High blood pressure causes heart disease and increases the risk of stroke. This is more likely to develop in people who have high blood pressure readings, are of African descent, or are overweight.  Have your blood pressure checked: ? Every 3-5 years if you are 18-39 years of age. ? Every year if you are 40 years old or older. Diabetes Have regular diabetes screenings. This checks your fasting blood sugar level. Have the screening done:  Once every three years after age 40 if you are at a normal weight and have a low risk for diabetes.  More often and at a younger age if you are overweight or have a high risk for diabetes. What should I know about preventing infection? Hepatitis B If you have a higher risk for hepatitis B, you should be screened for this virus. Talk with your health care provider to find out if you are at risk for hepatitis B infection. Hepatitis C Testing is recommended for:  Everyone born from 1945 through 1965.  Anyone with known risk factors for hepatitis C. Sexually transmitted infections (STIs)  Get screened for STIs, including gonorrhea and chlamydia, if: ? You are sexually active and are younger than 44 years of age. ? You are older than 44 years of age and your health care provider tells you that you are at risk for this type of infection. ? Your sexual activity has changed since you were last screened, and you are at increased risk for chlamydia or gonorrhea. Ask your health care provider   if you are at risk.  Ask your health care provider about whether you are at high risk for HIV. Your health care provider may recommend a prescription medicine to help prevent HIV infection. If you choose to take medicine to prevent HIV, you should first get tested for HIV. You should then be tested every 3 months for as long as you are taking the medicine. Pregnancy  If you are about to stop having your period (premenopausal) and  you may become pregnant, seek counseling before you get pregnant.  Take 400 to 800 micrograms (mcg) of folic acid every day if you become pregnant.  Ask for birth control (contraception) if you want to prevent pregnancy. Osteoporosis and menopause Osteoporosis is a disease in which the bones lose minerals and strength with aging. This can result in bone fractures. If you are 65 years old or older, or if you are at risk for osteoporosis and fractures, ask your health care provider if you should:  Be screened for bone loss.  Take a calcium or vitamin D supplement to lower your risk of fractures.  Be given hormone replacement therapy (HRT) to treat symptoms of menopause. Follow these instructions at home: Lifestyle  Do not use any products that contain nicotine or tobacco, such as cigarettes, e-cigarettes, and chewing tobacco. If you need help quitting, ask your health care provider.  Do not use street drugs.  Do not share needles.  Ask your health care provider for help if you need support or information about quitting drugs. Alcohol use  Do not drink alcohol if: ? Your health care provider tells you not to drink. ? You are pregnant, may be pregnant, or are planning to become pregnant.  If you drink alcohol: ? Limit how much you use to 0-1 drink a day. ? Limit intake if you are breastfeeding.  Be aware of how much alcohol is in your drink. In the [place] one drink equals one 12 oz bottle of beer (355 mL), one 5 oz glass of wine (148 mL), or one 1 oz glass of hard liquor (44 mL). General instructions  Schedule regular health, dental, and eye exams.  Stay current with your vaccines.  Tell your health care provider if: ? You often feel depressed. ? You have ever been abused or do not feel safe at home. Summary  Adopting a healthy lifestyle and getting preventive care are important in promoting health and wellness.  Follow your health care provider's instructions about healthy  diet, exercising, and getting tested or screened for diseases.  Follow your health care provider's instructions on monitoring your cholesterol and blood pressure. This information is not intended to replace advice given to you by your health care provider. Make sure you discuss any questions you have with your health care provider. Document Revised: 08/18/2018 Document Reviewed: 08/18/2018 Elsevier Patient Education  2021 Elsevier Inc.  

## 2020-09-20 NOTE — Progress Notes (Signed)
I,Katawbba Wiggins,acting as a Education administrator for Maximino Greenland, MD.,have documented all relevant documentation on the behalf of Maximino Greenland, MD,as directed by  Maximino Greenland, MD while in the presence of Maximino Greenland, MD.  This visit occurred during the SARS-CoV-2 public health emergency.  Safety protocols were in place, including screening questions prior to the visit, additional usage of staff PPE, and extensive cleaning of exam room while observing appropriate contact time as indicated for disinfecting solutions.  Subjective:     Patient ID: Virginia Mcdonald , female    DOB: 1976/08/15 , 45 y.o.   MRN: [id number]   Chief Complaint  Patient presents with  . Annual Exam    HPI  She is here today for a full physical examination. She is followed by Dr. Landry Mellow for her GYN exams.  She had her last pap smear in 2019. She adds that she is being scheduled for hysterectomy Feb 2022.   Diabetes She presents for her follow-up diabetic visit. She has type 2 diabetes mellitus. Her disease course has been stable. There are no hypoglycemic associated symptoms. There are no diabetic associated symptoms. There are no diabetic complications. Risk factors for coronary artery disease include diabetes mellitus and dyslipidemia. She participates in exercise daily. An ACE inhibitor/angiotensin II receptor blocker is not being taken.     Past Medical History:  Diagnosis Date  . Diet-controlled diabetes mellitus ([place])      Family History  Problem Relation Age of Onset  . Diabetes Mother   . Hypertension Mother   . Hypertension Father   . Hypertension Sister   . Diabetes Sister   . Healthy Brother   . Healthy Daughter   . Healthy Brother   . Healthy Brother   . Healthy Daughter   . Healthy Daughter      Current Outpatient Medications:  .  ACCU-CHEK FASTCLIX LANCETS MISC, Use as directed to check blood sugars 1 time per day dx: e11.65, Disp: 50 each, Rfl: 11 .  glucose blood (ACCU-CHEK  GUIDE) test strip, Use as instructed to check blood sugars 1 time per day dx: e11.65, Disp: 50 each, Rfl: 11 .  Cholecalciferol (VITAMIN D-3) 125 MCG (5000 UT) TABS, Take 5,000 Units by mouth 3 (three) times a week., Disp: , Rfl:  .  Misc Natural Products (BLOOD SUGAR BALANCE PO), Take 1 tablet by mouth daily. Synergy Blood Sugar Support with Gymnema & Chromium, Disp: , Rfl:  .  NON FORMULARY, Take 1 capsule by mouth daily. Lipid Factors, Disp: , Rfl:  .  Vitamins-Lipotropics (B COMPLEX, LIPOTROPICS, PO), Take 1 Syringe by mouth every 14 (fourteen) days. Lipotropic Shots received every 2 weeks at MD office, Disp: , Rfl:    Allergies  Allergen Reactions  . Penicillins Anaphylaxis  . Procardia [Nifedipine] Palpitations      The patient states she uses none for birth control. Last LMP was No LMP recorded (lmp unknown).. Negative for Dysmenorrhea and Positive for Menorrhagia. Negative for: breast discharge, breast lump(s), breast pain and breast self exam. Associated symptoms include abnormal vaginal bleeding. Pertinent negatives include abnormal bleeding (hematology), anxiety, decreased libido, depression, difficulty falling sleep, dyspareunia, history of infertility, nocturia, sexual dysfunction, sleep disturbances, urinary incontinence, urinary urgency, vaginal discharge and vaginal itching. Diet regular.The patient states her exercise level is  moderate.  . The patient's tobacco use is:  Social History   Tobacco Use  Smoking Status Never Smoker  Smokeless Tobacco Never Used  . She has been exposed  to passive smoke. The patient's alcohol use is:  Social History   Substance and Sexual Activity  Alcohol Use No    Review of Systems  Constitutional: Negative.   HENT: Negative.   Eyes: Negative.   Respiratory: Negative.   Cardiovascular: Negative.   Gastrointestinal: Negative.   Endocrine: Negative.   Genitourinary: Positive for menstrual problem.  Musculoskeletal: Negative.   Skin:  Negative.   Allergic/Immunologic: Negative.   Neurological: Negative.   Hematological: Negative.   Psychiatric/Behavioral: Negative.      Today's Vitals   09/20/20 0934  BP: 126/70  Pulse: 89  Temp: 98.1 F (36.7 C)  TempSrc: Oral  Weight: 135 lb 9.6 oz (61.5 kg)  Height: 5' 2.4" (1.585 m)   Body mass index is 24.48 kg/m.  Wt Readings from Last 3 Encounters:  09/20/20 135 lb 9.6 oz (61.5 kg)  07/10/20 135 lb 6.4 oz (61.4 kg)  06/12/20 137 lb (62.1 kg)   Objective:  Physical Exam Vitals and nursing note reviewed.  Constitutional:      General: She is not in acute distress.    Appearance: Normal appearance. She is well-developed.  HENT:     Head: Normocephalic and atraumatic.     Right Ear: Hearing, tympanic membrane, ear canal and external ear normal. There is no impacted cerumen.     Left Ear: Hearing, tympanic membrane, ear canal and external ear normal. There is no impacted cerumen.     Nose:     Comments: Deferred, masked    Mouth/Throat:     Comments: Deferred, masked Eyes:     General: Lids are normal.     Extraocular Movements: Extraocular movements intact.     Conjunctiva/sclera: Conjunctivae normal.     Pupils: Pupils are equal, round, and reactive to light.     Funduscopic exam:    Right eye: No papilledema.        Left eye: No papilledema.  Neck:     Thyroid: No thyroid mass.     Vascular: No carotid bruit.  Cardiovascular:     Rate and Rhythm: Normal rate and regular rhythm.     Pulses: Normal pulses.          Dorsalis pedis pulses are 2+ on the right side and 2+ on the left side.     Heart sounds: Normal heart sounds. No murmur heard.   Pulmonary:     Effort: Pulmonary effort is normal.     Breath sounds: Normal breath sounds.  Chest:  Breasts:     Tanner Score is 5.     Right: Normal.     Left: Normal.    Abdominal:     General: Abdomen is flat. Bowel sounds are normal. There is no distension.     Palpations: Abdomen is soft.      Tenderness: There is no abdominal tenderness.  Musculoskeletal:        General: No swelling. Normal range of motion.     Cervical back: Full passive range of motion without pain, normal range of motion and neck supple.     Right lower leg: No edema.     Left lower leg: No edema.  Feet:     Right foot:     Protective Sensation: 5 sites tested. 5 sites sensed.     Skin integrity: Dry skin present.     Toenail Condition: Right toenails are normal.     Left foot:     Protective Sensation: 5 sites tested. 5 sites  sensed.     Skin integrity: Dry skin present.     Toenail Condition: Left toenails are normal.  Skin:    General: Skin is warm and dry.     Capillary Refill: Capillary refill takes less than 2 seconds.  Neurological:     General: No focal deficit present.     Mental Status: She is alert and oriented to person, place, and time.     Cranial Nerves: No cranial nerve deficit.     Sensory: No sensory deficit.  Psychiatric:        Mood and Affect: Mood normal.        Behavior: Behavior normal.        Thought Content: Thought content normal.        Judgment: Judgment normal.         Assessment And Plan:     1. Routine general medical examination at health care facility Comments: A full exam was performed. Importance of monthly self breast exams was discussed with the patient. PATIENT IS ADVISED TO GET 30-45 MINUTES REGULAR EXERCISE NO LESS THAN FOUR TO FIVE DAYS PER WEEK - BOTH WEIGHTBEARING EXERCISES AND AEROBIC ARE RECOMMENDED.  PATIENT IS ADVISED TO FOLLOW A HEALTHY DIET WITH AT LEAST SIX FRUITS/VEGGIES PER DAY, DECREASE INTAKE OF RED MEAT, AND TO INCREASE FISH INTAKE TO TWO DAYS PER WEEK.  MEATS/FISH SHOULD NOT BE FRIED, BAKED OR BROILED IS PREFERABLE.  I SUGGEST WEARING SPF 50 SUNSCREEN ON EXPOSED PARTS AND ESPECIALLY WHEN IN THE DIRECT SUNLIGHT FOR AN EXTENDED PERIOD OF TIME.  PLEASE AVOID FAST FOOD RESTAURANTS AND INCREASE YOUR WATER INTAKE.  - BMP8+EGFR - Lipid panel  2.  Uncontrolled type 2 diabetes mellitus with hyperglycemia ([place]) Comments: Diabetic foot exam was performed. I DISCUSSED WITH THE PATIENT AT LENGTH REGARDING THE GOALS OF GLYCEMIC CONTROL AND POSSIBLE LONG-TERM COMPLICATIONS.  I  ALSO STRESSED THE IMPORTANCE OF COMPLIANCE WITH HOME GLUCOSE MONITORING, DIETARY RESTRICTIONS INCLUDING AVOIDANCE OF SUGARY DRINKS/PROCESSED FOODS,  ALONG WITH REGULAR EXERCISE.  I  ALSO STRESSED THE IMPORTANCE OF ANNUAL EYE EXAMS, SELF FOOT CARE AND COMPLIANCE WITH OFFICE VISITS.  - POCT Urinalysis Dipstick (81002) - POCT UA - Microalbumin - EKG 12-Lead - Hemoglobin A1c  3. Pure hypercholesterolemia Comments: Chronic. Pt advised goal LDL is <70. She does not wish to take rx meds. Currently on Lipid Factors 2 capsules daily - will recheck today.   4. Irregular menses Comments: She is scheduled to have a partial hysterectomy on 10/26/20. Pt wants to know if postmenopausal, I advised her not likely because she is having cycles.  [facility]  Patient was given opportunity to ask questions. Patient verbalized understanding of the plan and was able to repeat key elements of the plan. All questions were answered to their satisfaction.  Maximino Greenland, MD   I, Maximino Greenland, MD, have reviewed all documentation for this visit. The documentation on 10/02/20 for the exam, diagnosis, procedures, and orders are all accurate and complete.  THE PATIENT IS ENCOURAGED TO PRACTICE SOCIAL DISTANCING DUE TO THE COVID-19 PANDEMIC.

## 2020-09-21 LAB — BMP8+EGFR
BUN/Creatinine Ratio: 10 (ref 9–23)
BUN: 9 mg/dL (ref 6–24)
CO2: 21 mmol/L (ref 20–29)
Calcium: 9.7 mg/dL (ref 8.7–10.2)
Chloride: 102 mmol/L (ref 96–106)
Creatinine, Ser: 0.87 mg/dL (ref 0.57–1.00)
GFR calc Af Amer: 94 mL/min/{1.73_m2} (ref >59)
GFR calc non Af Amer: 82 mL/min/{1.73_m2} (ref >59)
Glucose: 107 mg/dL — ABNORMAL HIGH (ref 65–99)
Potassium: 4.1 mmol/L (ref 3.5–5.2)
Sodium: 138 mmol/L (ref 134–144)

## 2020-09-21 LAB — LIPID PANEL
Chol/HDL Ratio: 3.6 ratio (ref 0.0–4.4)
Cholesterol, Total: 226 mg/dL — ABNORMAL HIGH (ref 100–199)
HDL: 62 mg/dL (ref >39)
LDL Chol Calc (NIH): 152 mg/dL — ABNORMAL HIGH (ref 0–99)
Triglycerides: 68 mg/dL (ref 0–149)
VLDL Cholesterol Cal: 12 mg/dL (ref 5–40)

## 2020-09-21 LAB — HEMOGLOBIN A1C
Est. average glucose Bld gHb Est-mCnc: 146 mg/dL
Hgb A1c MFr Bld: 6.7 % — ABNORMAL HIGH (ref 4.8–5.6)

## 2020-09-21 LAB — FOLLICLE STIMULATING HORMONE: FSH: 19.6 m[IU]/mL

## 2020-09-24 ENCOUNTER — Encounter: Payer: Self-pay | Admitting: Internal Medicine

## 2020-10-12 ENCOUNTER — Encounter (HOSPITAL_COMMUNITY): Admission: RE | Admit: 2020-10-12 | Payer: 59 | Source: Ambulatory Visit

## 2020-10-15 ENCOUNTER — Encounter (HOSPITAL_BASED_OUTPATIENT_CLINIC_OR_DEPARTMENT_OTHER): Payer: Self-pay | Admitting: Obstetrics and Gynecology

## 2020-10-15 ENCOUNTER — Other Ambulatory Visit: Payer: Self-pay

## 2020-10-15 NOTE — Progress Notes (Signed)
Spoke w/ via phone for pre-op interview---pt Lab needs dos----urine poct               Lab results------lab app t2-07-2021 915 for cbc bmp ekg COVID test ------10-22-2020 at 800 am Arrive at -------600 am NPO after MN NO Solid Food.  Clear liquids from MN until---500 am then npo Medications to take morning of surgery -----none Diabetic medication -----n/a Patient Special Instructions -----none Pre-Op special Istructions -----none Patient verbalized understanding of instructions that were given at this phone interview. Patient denies shortness of breath, chest pain, fever, cough at this phone interview.

## 2020-10-15 NOTE — Progress Notes (Signed)
YOU ARE SCHEDULED FOR A COVID TEST 10-22-2020 @ 800 AM. THIS TEST MUST BE DONE BEFORE SURGERY. GO TO  [facility] [place], [place], IT IS APPROXIMATELY 2 MINUTES PAST [employer] ON THE RIGHT AND REMAIN IN YOUR CAR, THIS IS A DRIVE UP TEST. ONCE YOUR COVID TEST IS DONE PLEASE FOLLOW ALL THE QUARANTINE  INSTRUCTIONS GIVEN IN YOUR HANDOUT.      Your procedure is scheduled on 10-23-2020  Report to [facility]   Call this number if you have problems the morning of surgery  [phone].   OUR ADDRESS IS [address].  WE ARE LOCATED IN [facility].  PLEASE BRING YOUR INSURANCE CARD AND PHOTO ID DAY OF SURGERY.  ONLY ONE PERSON ALLOWED IN FACILITY WAITING AREA.                                     REMEMBER:  DO NOT EAT FOOD, CANDY GUM OR MINTS  AFTER MIDNIGHT . YOU MAY HAVE CLEAR LIQUIDS FROM MIDNIGHT UNTIL  500 AM. NO CLEAR LIQUIDS AFTER 500 AM DAY OF SURGERY.   YOU MAY  BRUSH YOUR TEETH MORNING OF SURGERY AND RINSE YOUR MOUTH OUT, NO CHEWING GUM CANDY OR MINTS.    CLEAR LIQUID DIET   Foods Allowed                                                                     Foods Excluded  Coffee and tea, regular and decaf                             liquids that you cannot  Plain Jell-O any favor except red or purple                                           see through such as: Fruit ices (not with fruit pulp)                                     milk, soups, orange juice  Iced Popsicles                                    All solid food Carbonated beverages, regular and diet                                    Cranberry, grape and apple juices Sports drinks like Gatorade Lightly seasoned clear broth or consume(fat free) Sugar, honey syrup  Sample Menu Breakfast                                Lunch  Supper Cranberry juice                    Beef broth                            Chicken  broth Jell-O                                     Grape juice                           Apple juice Coffee or tea                        Jell-O                                      Popsicle                                                Coffee or tea                        Coffee or tea  _____________________________________________________________________     TAKE THESE MEDICATIONS MORNING OF SURGERY WITH A SIP OF WATER:  NONE   VISITOR IS ALLOWED IN WAITING ROOM ONLY DAY OF SURGERY.  NO VISITOR MAY SPEND THE NIGHT.  VISITOR ARE ALLOWED TO STAY UNTIL 800 PM.                                    DO NOT WEAR JEWERLY, MAKE UP, OR NAIL POLISH ON FINGERNAILS. DO NOT WEAR LOTIONS, POWDERS, PERFUMES OR DEODORANT. DO NOT SHAVE FOR 24 HOURS PRIOR TO DAY OF SURGERY. MEN MAY SHAVE FACE AND NECK. CONTACTS, GLASSES, OR DENTURES MAY NOT BE WORN TO SURGERY.                                    [facility] IS NOT RESPONSIBLE  FOR ANY BELONGINGS.                                                                    Marland Kitchen           [facility] Preparing for [facility] Before surgery, you can play an important role.  Because skin is not sterile, your skin needs to be as free of germs as possible.  You can reduce the number of germs on your skin by washing with CHG (chlorahexidine gluconate) soap before surgery.  CHG is an antiseptic cleaner which kills germs and bonds with the skin to continue killing germs even after washing. Please DO NOT use if you have an allergy to CHG or antibacterial soaps.  If  your skin becomes reddened/irritated stop using the CHG and inform your nurse when you arrive at Short Stay. Do not shave (including legs and underarms) for at least 48 hours prior to the first CHG shower.  You may shave your face/neck. Please follow these instructions carefully:  1.  Shower with CHG Soap the night before surgery and the  morning of Surgery.  2.  If you choose to wash your hair, wash your hair first as  usual with your  normal  shampoo.  3.  After you shampoo, rinse your hair and body thoroughly to remove the  shampoo.                            4.  Use CHG as you would any other liquid soap.  You can apply chg directly  to the skin and wash                      Gently with a scrungie or clean washcloth.  5.  Apply the CHG Soap to your body ONLY FROM THE NECK DOWN.   Do not use on face/ open                           Wound or open sores. Avoid contact with eyes, ears mouth and genitals (private parts).                       Wash face,  Genitals (private parts) with your normal soap.             6.  Wash thoroughly, paying special attention to the area where your surgery  will be performed.  7.  Thoroughly rinse your body with warm water from the neck down.  8.  DO NOT shower/wash with your normal soap after using and rinsing off  the CHG Soap.                9.  Pat yourself dry with a clean towel.            10.  Wear clean pajamas.            11.  Place clean sheets on your bed the night of your first shower and do not  sleep with pets. Day of Surgery : Do not apply any lotions/deodorants the morning of surgery.  Please wear clean clothes to the hospital/surgery center.  FAILURE TO FOLLOW THESE INSTRUCTIONS MAY RESULT IN THE CANCELLATION OF YOUR SURGERY PATIENT SIGNATURE_________________________________  NURSE SIGNATURE__________________________________  ________________________________________________________________________                                                        QUESTIONS Virginia Mcdonald PRE OP NURSE PHONE [phone]

## 2020-10-19 ENCOUNTER — Encounter (HOSPITAL_COMMUNITY)
Admission: RE | Admit: 2020-10-19 | Discharge: 2020-10-19 | Disposition: A | Discharge status: Home / Self Care | Payer: 59 | Source: Ambulatory Visit | Attending: Obstetrics and Gynecology | Admitting: Obstetrics and Gynecology

## 2020-10-19 ENCOUNTER — Other Ambulatory Visit (HOSPITAL_COMMUNITY): Payer: 59

## 2020-10-19 ENCOUNTER — Other Ambulatory Visit: Payer: Self-pay

## 2020-10-19 DIAGNOSIS — E118 Type 2 diabetes mellitus with unspecified complications: Secondary | ICD-10-CM | POA: Insufficient documentation

## 2020-10-19 DIAGNOSIS — Z01818 Encounter for other preprocedural examination: Secondary | ICD-10-CM | POA: Insufficient documentation

## 2020-10-19 LAB — CBC
HCT: 35.2 % — ABNORMAL LOW (ref 36.0–46.0)
Hemoglobin: 11.1 g/dL — ABNORMAL LOW (ref 12.0–15.0)
MCH: 26.2 pg (ref 26.0–34.0)
MCHC: 31.5 g/dL (ref 30.0–36.0)
MCV: 83.2 fL (ref 80.0–100.0)
Platelets: 185 10*3/uL (ref 150–400)
RBC: 4.23 MIL/uL (ref 3.87–5.11)
RDW: 16.2 % — ABNORMAL HIGH (ref 11.5–15.5)
WBC: 4.3 10*3/uL (ref 4.0–10.5)
nRBC: 0 % (ref 0.0–0.2)

## 2020-10-19 LAB — BASIC METABOLIC PANEL
Anion gap: 7 (ref 5–15)
BUN: 12 mg/dL (ref 6–20)
CO2: 25 mmol/L (ref 22–32)
Calcium: 9.1 mg/dL (ref 8.9–10.3)
Chloride: 103 mmol/L (ref 98–111)
Creatinine, Ser: 0.82 mg/dL (ref 0.44–1.00)
GFR, Estimated: >60 mL/min (ref >60)
Glucose, Bld: 130 mg/dL — ABNORMAL HIGH (ref 70–99)
Potassium: 4 mmol/L (ref 3.5–5.1)
Sodium: 135 mmol/L (ref 135–145)

## 2020-10-22 ENCOUNTER — Other Ambulatory Visit (HOSPITAL_COMMUNITY)
Admission: RE | Admit: 2020-10-22 | Discharge: 2020-10-22 | Disposition: A | Discharge status: Home / Self Care | Payer: 59 | Source: Ambulatory Visit | Attending: Obstetrics and Gynecology | Admitting: Obstetrics and Gynecology

## 2020-10-22 ENCOUNTER — Other Ambulatory Visit: Payer: Self-pay | Admitting: Obstetrics and Gynecology

## 2020-10-22 DIAGNOSIS — Z01812 Encounter for preprocedural laboratory examination: Secondary | ICD-10-CM | POA: Insufficient documentation

## 2020-10-22 DIAGNOSIS — Z20822 Contact with and (suspected) exposure to covid-19: Secondary | ICD-10-CM | POA: Insufficient documentation

## 2020-10-22 LAB — SARS CORONAVIRUS 2 (TAT 6-24 HRS): SARS Coronavirus 2: NEGATIVE

## 2020-10-22 NOTE — H&P (Signed)
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  Has patient received COVID-19 booster?  Yes, [employer].  Does patient report new onset of COVID symptoms?  No.  Has patient or close contact tested positive for COVID-19?  No , not in the past 2 weeks.         General          45 yo presents for pre-op visit.            Pt is scheduled for robotic assisted laparoscopic hysterectomy with bilateral salpingectomy on Oct 23, 2020 for management of menorrhagia and fibroids.            H/o BTL.            Pt seen Nov 23, 2019 c/o irregular and heavy menses w/ saturation of pads and lower back pain. She reported skipping a cycle in Feb 2021 and changing super heavy pad every 2 hrs on her heaviest day. Endorsed RLQ tenderness and mild lightheadedness. H/o BTL. Pelvic exam revealed RT adnexal tenderness. TSH and FSH WNL. CBC revealed slight anemia. OTC iron recommended.            Pt seen Jul 13, 2020 c/o periods 2 wks apart for 3-4 months and often lasting 7 days. She reported using thick pads and tampons w/ changes about 5-6x daily for her 4-5 heavy days. Pelvic exam revealed small amt of blood in vaginal vault given pt was on her period and 12 wk size uterus, nontender. She was prescribed Provera 10 MG. TSH WNL. HGB low at 11.5, indicating slight anemia.            Pt last seen on Jul 17, 2020. Pt confirmed interest in hysterectomy. Myfembree was prescribed. Pt was advised to continue taking it until surgery.            U/S on Jul 17, 2020 revealed uterus measuring 9.7 x 7.7 x 7.4 cm. Endometrium thickened at 2.7 cm. All 4 fibroids have increased in size since U/S on Jun 25, 2017 w/ the largest fibroid fundal and submucosal and measuring 4.9 cm. Endometrium difficult to measure due to submucosal fibroid in endometrial cavity.  Bilat OV WNL.            EMB on Jul 17, 2020 revealed weakly proliferative endometrium w/ stromal breakdown. No hyperplasia or malignancy.             Today, pt reports she was taking Myfembree for about 1 month. She experienced side effect of hot flashes and discontinued medication. She did see some improvement in her heavy bleeding.            Given her pre-diabetes diagnosis, she has been trying to increase exercise and adjust her diet for a healthier lifestyle.            Pelvic exam revealed 12 wk size uterus on exam and scant amt of blood in vaginal vault.     Current Medication:      Taking   Vitamin D 2000 UNIT Tablet 1 tablet Orally every other day.      Calcium 250 MG Capsule 1 tablet Orally every other day.      Magnesium . Capsule 1 capsule with a meal Orally Once  a day.      iron 1 tab Oral.         Not-Taking   Myfembree(Relugolix-Estradiol-Norethind) 40-1-0.5 MG Tablet 1 tablet Orally Once a day.      Myfembree(Relugolix-Estradiol-Norethind) 40-1-0.5 MG Tablet 1 tablet Orally Once a day.      Provera(medroxyPROGESTERone) 10 MG Tablet 1 tablet with food Orally Once a day.         Discontinued   OTC , Notes: flo-PMS gummy vitamins.      Medication List reviewed and reconciled with the patient.      Medical History:   low vitamin D      seasonal allergies      prediabetes      hypercholesterolemia       Allergies/Intolerance:      Penicillin - anaphylaxis    Gyn History:   Sexual activity currently sexually active.   Periods : irregular.   LMP 10/05/2020.   Birth control BTL.   Last pap smear date 06/15/2017-neg.   Last mammogram date 02/08/18-normal .   Denies Abnormal pap smear.   Denies STD.   Menarche 4.   GYN procedures 2015 - Pelvic U/S (Fibroid).        OB History:   Number of pregnancies  4.   miscarriages  1.   Pregnancy # 1  miscarriage.   Pregnancy # 2  1994, live birth, vaginal delivery, girl.   Pregnancy # 3  1996, live birth, vaginal  delivery, girl.   Pregnancy # 4:  2003, live birth, girl, C-section.        Surgical History:   c-section 2003      BTL during cesarean section in 2003 12/2001       Hospitalization:   childbirth x 1 via c-section 2003      childbirth x 2 vaginal 1996 & 1994       Family History:   Father: alive    Mother: alive, diabetes, HTN, diagnosed with Hypertension    Paternal [place] Father: unknown    Paternal [place] Mother: unknown    Maternal Grand Father: deceased    Maternal Grand Mother: deceased    Daughter(s): alive, 3 daughters    3 daughter(s) - healthy.          1/2 siblings\NEG GYN FAMILY HX.     Social History:       General         Tobacco use cigarettes:  Never smoked, Tobacco history last updated  10/09/2020, Vaping  No.           no Alcohol.           Caffeine: yes, coffee.           no Recreational drug use.           Marital Status: married.           Children: 3, daughter (s).           OCCUPATION: employed, [employer].      ROS:       CONSTITUTIONAL         Chills  No.  Fatigue  No.  Fever  No.  Night sweats  YES.  Recent travel outside [place]  No.  Sweats  No.  Weight change  No.         OPHTHALMOLOGY         Blurring of vision  no.  Change in vision  no.  Double vision  no.  ENT         Dizziness  no.  Nose bleeds  no.  Sore throat  no.  Teeth pain  no.         ALLERGY         Hives  no.         CARDIOLOGY         Chest pain  no.  High blood pressure  no.  Irregular heart beat  no.  Leg edema  no.  Palpitations  no.         RESPIRATORY         Shortness of breath  no.  Cough  no.  Wheezing  no.         UROLOGY         Pain with urination  no.  Urinary urgency  no.  Urinary frequency  no.  Urinary incontinence  no.  Difficulty urinating  No.  Blood in urine  YES.         GASTROENTEROLOGY         Abdominal pain  no.  Appetite change  no.  Bloating/belching  no.  Blood in stool or on toilet paper  no.  Change in bowel movements  no.  Constipation   no.  Diarrhea  no.  Difficulty swallowing  no.  Nausea  no.         FEMALE REPRODUCTIVE         Vulvar pain  no.  Vulvar rash  no.  Abnormal vaginal bleeding  no.  Breast pain  no.  Nipple discharge  no.  Pain with intercourse  no.  Pelvic pain  YES.  Unusual vaginal discharge  no.  Vaginal itching  no.         MUSCULOSKELETAL         Muscle aches  YES.         NEUROLOGY         Headache  no.  Tingling/numbness  no.  Weakness  no.         PSYCHOLOGY         Depression  no.  Anxiety  no.  Nervousness  no.  Sleep disturbances  no.  Suicidal ideation  no .         ENDOCRINOLOGY         Excessive thirst  no.  Excessive urination  no.  Hair loss  no.  Heat or cold intolerance  no.         HEMATOLOGY/LYMPH         Abnormal bleeding  no.  Easy bruising  no.  Swollen glands  no.         DERMATOLOGY         New/changing skin lesion  no.  Rash  no.  Sores  no.            Negative except as stated in HPI.   Objective:    Vitals:        Wt 140.4, Wt change 2.9 lb, Ht 63, BMI 24.87, Pulse sitting 86, BP sitting 120/62.     Past Results:    Examination:          General Examination         CONSTITUTIONAL: alert, oriented, NAD .          SKIN: moist, warm.          EYES: Conjunctiva clear.          LUNGS: good  I:E efffort noted, CTA bilat.          HEART: RRR.          ABDOMEN: soft, non-tender/non-distended, bowel sounds present .          FEMALE GENITOURINARY: normal external genitalia, labia - unremarkable, vagina - pink moist mucosa, no lesions or abnormal discharge, scant amt of blood in vaginal vault, cervix - no discharge or lesions or CMT, adnexa - no masses or tenderness, uterus - nontender, 12 wk size uterus on exam.          PSYCH: affect normal, good eye contact.      Physical Examination:    Assessment:     Assessment:    Menorrhagia with irregular cycle - N92.1 (Primary)      Fibroids, intramural - D25.1        Plan:    Treatment:      Menorrhagia with  irregular cycle          Notes: Pt is scheduled for robotic assisted laparoscopic hysterectomy with bilateral salpingectomy on Oct 23, 2020 for management of menorrhagia and fibroids. Pt advised her ovaries will not be removed to prevent early onset menopause. Pt advised she can go home the same day, though recommended staying overnight. She is advised that in order to be discharged from hospital, she will need to be able to ambulate, urinate, flatulate, tolerate food by mouth, and take pain medication by mouth. Discussed risks of hysterectomy including but not limited to infection, bleeding, conversion to larger incision, damage to her bowel, bladder, or ureters, with the need for further surgery. Discussed risk of blood transfusion and risk of HIV or hep B&C (1 out of 2 million and 1 out of 200,000, respectively) with blood transfusion. Pt is aware of risks and desires blood transfusion if needed. Pt advised to avoid NSAIDs (Aspirin, Aleve, Advil, Ibuprofen, Motrin) from now until surgery given risk of bleeding during surgery. She may take Tylenol for pain management. . She is advised to avoid eating or drinking starting midnight prior to surgery. Discussed post-surgery avoidance of driving for 1 week and avoidance of lifting weight greater than 10 lbs or intercourse for 6-8 weeks after procedure. Follow up in 4 weeks for 2 wk post-op visit.      Fibroids, intramural          Notes: Pt is scheduled for robotic assisted laparoscopic hysterectomy with bilateral salpingectomy on Oct 23, 2020 for management of menorrhagia and fibroids. Follow up in 4 weeks for 2 wk post-op visit.

## 2020-10-22 NOTE — Anesthesia Preprocedure Evaluation (Addendum)
Anesthesia Evaluation  Patient identified by MRN, date of birth, ID band Patient awake    Reviewed: Allergy & Precautions, H&P , NPO status , Patient's Chart, lab work & pertinent test results  Airway Mallampati: II  TM Distance: >3 FB Neck ROM: Full    Dental no notable dental hx. (+) Teeth Intact, Dental Advisory Given, Implants,    Pulmonary neg pulmonary ROS,    Pulmonary exam normal breath sounds clear to auscultation       Cardiovascular Exercise Tolerance: Good negative cardio ROS Normal cardiovascular exam Rhythm:Regular Rate:Normal     Neuro/Psych negative neurological ROS  negative psych ROS   GI/Hepatic negative GI ROS, Neg liver ROS,   Endo/Other  diabetes, Well Controlled  Renal/GU negative Renal ROS  negative genitourinary   Musculoskeletal negative musculoskeletal ROS (+)   Abdominal   Peds negative pediatric ROS (+)  Hematology negative hematology ROS (+)   Anesthesia Other Findings   Reproductive/Obstetrics negative OB ROS                            Anesthesia Physical Anesthesia Plan  ASA: II  Anesthesia Plan: General   Post-op Pain Management:    Induction: Intravenous  PONV Risk Score and Plan: 3 and Ondansetron and Dexamethasone  Airway Management Planned: Oral ETT  Additional Equipment:   Intra-op Plan:   Post-operative Plan: Extubation in OR  Informed Consent: I have reviewed the patients History and Physical, chart, labs and discussed the procedure including the risks, benefits and alternatives for the proposed anesthesia with the patient or authorized representative who has indicated his/her understanding and acceptance.       Plan Discussed with: Anesthesiologist  Anesthesia Plan Comments: (  )        Anesthesia Quick Evaluation

## 2020-10-23 ENCOUNTER — Encounter (HOSPITAL_BASED_OUTPATIENT_CLINIC_OR_DEPARTMENT_OTHER): Payer: Self-pay | Admitting: Obstetrics and Gynecology

## 2020-10-23 ENCOUNTER — Encounter (HOSPITAL_BASED_OUTPATIENT_CLINIC_OR_DEPARTMENT_OTHER)
Admission: RE | Disposition: A | Discharge status: Home / Self Care | Payer: Self-pay | Source: Ambulatory Visit | Attending: Obstetrics and Gynecology

## 2020-10-23 ENCOUNTER — Observation Stay (HOSPITAL_BASED_OUTPATIENT_CLINIC_OR_DEPARTMENT_OTHER)
Admission: RE | Admit: 2020-10-23 | Discharge: 2020-10-24 | Disposition: A | Discharge status: Home / Self Care | Payer: 59 | Source: Ambulatory Visit | Attending: Obstetrics and Gynecology | Admitting: Obstetrics and Gynecology

## 2020-10-23 ENCOUNTER — Observation Stay (HOSPITAL_BASED_OUTPATIENT_CLINIC_OR_DEPARTMENT_OTHER): Payer: 59 | Admitting: Anesthesiology

## 2020-10-23 DIAGNOSIS — Z90710 Acquired absence of both cervix and uterus: Secondary | ICD-10-CM | POA: Diagnosis present

## 2020-10-23 DIAGNOSIS — N920 Excessive and frequent menstruation with regular cycle: Secondary | ICD-10-CM | POA: Diagnosis not present

## 2020-10-23 DIAGNOSIS — D259 Leiomyoma of uterus, unspecified: Principal | ICD-10-CM | POA: Insufficient documentation

## 2020-10-23 DIAGNOSIS — D219 Benign neoplasm of connective and other soft tissue, unspecified: Secondary | ICD-10-CM | POA: Diagnosis present

## 2020-10-23 HISTORY — DX: Benign neoplasm of connective and other soft tissue, unspecified: D21.9

## 2020-10-23 HISTORY — PX: ROBOTIC ASSISTED LAPAROSCOPIC HYSTERECTOMY AND SALPINGECTOMY: SHX6379

## 2020-10-23 LAB — GLUCOSE, CAPILLARY
Glucose-Capillary: 129 mg/dL — ABNORMAL HIGH (ref 70–99)
Glucose-Capillary: 134 mg/dL — ABNORMAL HIGH (ref 70–99)

## 2020-10-23 LAB — TYPE AND SCREEN
ABO/RH(D): O POS
Antibody Screen: NEGATIVE

## 2020-10-23 LAB — ABO/RH: ABO/RH(D): O POS

## 2020-10-23 LAB — POCT PREGNANCY, URINE: Preg Test, Ur: NEGATIVE

## 2020-10-23 SURGERY — XI ROBOTIC ASSISTED LAPAROSCOPIC HYSTERECTOMY AND SALPINGECTOMY
Anesthesia: General | Laterality: Bilateral

## 2020-10-23 MED ORDER — OXYCODONE HCL 5 MG PO TABS: 5.0000 mg | ORAL_TABLET | ORAL | 0 refills | Status: AC | PRN

## 2020-10-23 MED ORDER — OXYCODONE HCL 5 MG PO TABS
ORAL_TABLET | ORAL | Status: AC
  Filled 2020-10-23: qty 1

## 2020-10-23 MED ORDER — KETOROLAC TROMETHAMINE 30 MG/ML IJ SOLN
INTRAMUSCULAR | Status: AC
  Filled 2020-10-23: qty 1

## 2020-10-23 MED ORDER — HYDROMORPHONE HCL 1 MG/ML IJ SOLN: 0.2000 mg | INTRAMUSCULAR | Status: DC | PRN

## 2020-10-23 MED ORDER — OXYCODONE HCL 5 MG PO TABS: 5.0000 mg | ORAL_TABLET | Freq: Once | ORAL | Status: DC | PRN

## 2020-10-23 MED ORDER — PHENYLEPHRINE 40 MCG/ML (10ML) SYRINGE FOR IV PUSH (FOR BLOOD PRESSURE SUPPORT)
PREFILLED_SYRINGE | INTRAVENOUS | Status: AC
  Filled 2020-10-23: qty 10

## 2020-10-23 MED ORDER — LACTATED RINGERS IV SOLN: INTRAVENOUS | Status: DC

## 2020-10-23 MED ORDER — ROCURONIUM BROMIDE 10 MG/ML (PF) SYRINGE
PREFILLED_SYRINGE | INTRAVENOUS | Status: DC | PRN
  Administered 2020-10-23: 70 mg via INTRAVENOUS

## 2020-10-23 MED ORDER — LIDOCAINE HCL (PF) 2 % IJ SOLN
INTRAMUSCULAR | Status: AC
  Filled 2020-10-23: qty 5

## 2020-10-23 MED ORDER — FENTANYL CITRATE (PF) 100 MCG/2ML IJ SOLN: 25.0000 ug | INTRAMUSCULAR | Status: DC | PRN

## 2020-10-23 MED ORDER — PANTOPRAZOLE SODIUM 40 MG PO TBEC
40.0000 mg | DELAYED_RELEASE_TABLET | Freq: Every day | ORAL | Status: DC
  Administered 2020-10-23: 40 mg via ORAL

## 2020-10-23 MED ORDER — SENNA 8.6 MG PO TABS
ORAL_TABLET | ORAL | Status: AC
  Filled 2020-10-23: qty 1

## 2020-10-23 MED ORDER — CIPROFLOXACIN IN D5W 400 MG/200ML IV SOLN
INTRAVENOUS | Status: AC
  Filled 2020-10-23: qty 200

## 2020-10-23 MED ORDER — ONDANSETRON HCL 4 MG/2ML IJ SOLN
INTRAMUSCULAR | Status: AC
  Filled 2020-10-23: qty 2

## 2020-10-23 MED ORDER — OXYCODONE HCL 5 MG PO TABS
5.0000 mg | ORAL_TABLET | ORAL | Status: DC | PRN
  Administered 2020-10-23: 5 mg via ORAL

## 2020-10-23 MED ORDER — ACETAMINOPHEN 500 MG PO TABS
ORAL_TABLET | ORAL | Status: AC
  Filled 2020-10-23: qty 2

## 2020-10-23 MED ORDER — FENTANYL CITRATE (PF) 250 MCG/5ML IJ SOLN
INTRAMUSCULAR | Status: AC
  Filled 2020-10-23: qty 5

## 2020-10-23 MED ORDER — EPHEDRINE 5 MG/ML INJ
INTRAVENOUS | Status: AC
  Filled 2020-10-23: qty 10

## 2020-10-23 MED ORDER — CLINDAMYCIN PHOSPHATE 900 MG/50ML IV SOLN
INTRAVENOUS | Status: AC
  Filled 2020-10-23: qty 50

## 2020-10-23 MED ORDER — OXYCODONE HCL 5 MG/5ML PO SOLN: 5.0000 mg | Freq: Once | ORAL | Status: DC | PRN

## 2020-10-23 MED ORDER — FENTANYL CITRATE (PF) 250 MCG/5ML IJ SOLN
INTRAMUSCULAR | Status: DC | PRN
  Administered 2020-10-23 (×2): 25 ug via INTRAVENOUS
  Administered 2020-10-23: 50 ug via INTRAVENOUS
  Administered 2020-10-23: 100 ug via INTRAVENOUS

## 2020-10-23 MED ORDER — PANTOPRAZOLE SODIUM 40 MG PO TBEC
DELAYED_RELEASE_TABLET | ORAL | Status: AC
  Filled 2020-10-23: qty 1

## 2020-10-23 MED ORDER — EPHEDRINE SULFATE-NACL 50-0.9 MG/10ML-% IV SOSY
PREFILLED_SYRINGE | INTRAVENOUS | Status: DC | PRN
  Administered 2020-10-23: 10 mg via INTRAVENOUS

## 2020-10-23 MED ORDER — DEXAMETHASONE SODIUM PHOSPHATE 10 MG/ML IJ SOLN
INTRAMUSCULAR | Status: DC | PRN
  Administered 2020-10-23: 5 mg via INTRAVENOUS

## 2020-10-23 MED ORDER — MIDAZOLAM HCL 2 MG/2ML IJ SOLN
INTRAMUSCULAR | Status: DC | PRN
  Administered 2020-10-23: 2 mg via INTRAVENOUS

## 2020-10-23 MED ORDER — ACETAMINOPHEN 325 MG PO TABS: 325.0000 mg | ORAL_TABLET | ORAL | Status: DC | PRN

## 2020-10-23 MED ORDER — PROPOFOL 10 MG/ML IV BOLUS
INTRAVENOUS | Status: AC
  Filled 2020-10-23: qty 20

## 2020-10-23 MED ORDER — ROCURONIUM BROMIDE 10 MG/ML (PF) SYRINGE
PREFILLED_SYRINGE | INTRAVENOUS | Status: AC
  Filled 2020-10-23: qty 10

## 2020-10-23 MED ORDER — MEPERIDINE HCL 25 MG/ML IJ SOLN: 6.2500 mg | INTRAMUSCULAR | Status: DC | PRN

## 2020-10-23 MED ORDER — ONDANSETRON HCL 4 MG/2ML IJ SOLN: 4.0000 mg | Freq: Once | INTRAMUSCULAR | Status: DC | PRN

## 2020-10-23 MED ORDER — DEXMEDETOMIDINE (PRECEDEX) IN NS 20 MCG/5ML (4 MCG/ML) IV SYRINGE
PREFILLED_SYRINGE | INTRAVENOUS | Status: AC
  Filled 2020-10-23: qty 10

## 2020-10-23 MED ORDER — SIMETHICONE 80 MG PO CHEW: 80.0000 mg | CHEWABLE_TABLET | Freq: Four times a day (QID) | ORAL | Status: DC | PRN

## 2020-10-23 MED ORDER — LIDOCAINE HCL (CARDIAC) PF 100 MG/5ML IV SOSY
PREFILLED_SYRINGE | INTRAVENOUS | Status: DC | PRN
  Administered 2020-10-23: 100 mg via INTRAVENOUS

## 2020-10-23 MED ORDER — SODIUM CHLORIDE (PF) 0.9 % IJ SOLN
INTRAMUSCULAR | Status: DC | PRN
  Administered 2020-10-23: 60 mL via INTRAVENOUS

## 2020-10-23 MED ORDER — ONDANSETRON HCL 4 MG PO TABS: 4.0000 mg | ORAL_TABLET | Freq: Four times a day (QID) | ORAL | Status: DC | PRN

## 2020-10-23 MED ORDER — CELECOXIB 200 MG PO CAPS
ORAL_CAPSULE | ORAL | Status: AC
  Filled 2020-10-23: qty 2

## 2020-10-23 MED ORDER — IBUPROFEN 800 MG PO TABS
ORAL_TABLET | ORAL | Status: AC
  Filled 2020-10-23: qty 1

## 2020-10-23 MED ORDER — ACETAMINOPHEN 500 MG PO TABS
1000.0000 mg | ORAL_TABLET | ORAL | Status: AC
  Administered 2020-10-23: 1000 mg via ORAL

## 2020-10-23 MED ORDER — SODIUM CHLORIDE 0.9 % IR SOLN: Status: DC | PRN

## 2020-10-23 MED ORDER — POVIDONE-IODINE 10 % EX SWAB: 2.0000 "application " | Freq: Once | CUTANEOUS | Status: DC

## 2020-10-23 MED ORDER — CLINDAMYCIN PHOSPHATE 900 MG/50ML IV SOLN
900.0000 mg | INTRAVENOUS | Status: AC
  Administered 2020-10-23: 600 mg via INTRAVENOUS

## 2020-10-23 MED ORDER — MENTHOL 3 MG MT LOZG: 1.0000 | LOZENGE | OROMUCOSAL | Status: DC | PRN

## 2020-10-23 MED ORDER — IBUPROFEN 800 MG PO TABS: 800.0000 mg | ORAL_TABLET | Freq: Three times a day (TID) | ORAL | 1 refills | Status: DC | PRN

## 2020-10-23 MED ORDER — ONDANSETRON HCL 4 MG/2ML IJ SOLN
4.0000 mg | Freq: Four times a day (QID) | INTRAMUSCULAR | Status: DC | PRN
  Administered 2020-10-23: 4 mg via INTRAVENOUS

## 2020-10-23 MED ORDER — ACETAMINOPHEN 160 MG/5ML PO SOLN: 325.0000 mg | ORAL | Status: DC | PRN

## 2020-10-23 MED ORDER — IBUPROFEN 800 MG PO TABS
800.0000 mg | ORAL_TABLET | Freq: Three times a day (TID) | ORAL | Status: DC
  Administered 2020-10-23 – 2020-10-24 (×2): 800 mg via ORAL

## 2020-10-23 MED ORDER — MIDAZOLAM HCL 2 MG/2ML IJ SOLN
INTRAMUSCULAR | Status: AC
  Filled 2020-10-23: qty 2

## 2020-10-23 MED ORDER — ONDANSETRON HCL 4 MG/2ML IJ SOLN
INTRAMUSCULAR | Status: DC | PRN
  Administered 2020-10-23: 4 mg via INTRAVENOUS

## 2020-10-23 MED ORDER — CIPROFLOXACIN IN D5W 400 MG/200ML IV SOLN
400.0000 mg | INTRAVENOUS | Status: AC
  Administered 2020-10-23: 400 mg via INTRAVENOUS

## 2020-10-23 MED ORDER — SUGAMMADEX SODIUM 200 MG/2ML IV SOLN
INTRAVENOUS | Status: DC | PRN
  Administered 2020-10-23: 150 mg via INTRAVENOUS

## 2020-10-23 MED ORDER — PHENYLEPHRINE 40 MCG/ML (10ML) SYRINGE FOR IV PUSH (FOR BLOOD PRESSURE SUPPORT)
PREFILLED_SYRINGE | INTRAVENOUS | Status: DC | PRN
  Administered 2020-10-23 (×3): 40 ug via INTRAVENOUS
  Administered 2020-10-23 (×2): 80 ug via INTRAVENOUS
  Administered 2020-10-23 (×2): 40 ug via INTRAVENOUS

## 2020-10-23 MED ORDER — PROPOFOL 10 MG/ML IV BOLUS
INTRAVENOUS | Status: DC | PRN
Start: 2020-10-23 — End: 2020-10-23
  Administered 2020-10-23: 160 mg via INTRAVENOUS

## 2020-10-23 MED ORDER — ALUM & MAG HYDROXIDE-SIMETH 200-200-20 MG/5ML PO SUSP: 30.0000 mL | ORAL | Status: DC | PRN

## 2020-10-23 MED ORDER — ZOLPIDEM TARTRATE 5 MG PO TABS: 5.0000 mg | ORAL_TABLET | Freq: Every evening | ORAL | Status: DC | PRN

## 2020-10-23 MED ORDER — SODIUM CHLORIDE 0.9 % IV SOLN
INTRAVENOUS | Status: DC | PRN
  Administered 2020-10-23: 120 mL

## 2020-10-23 MED ORDER — ACETAMINOPHEN 500 MG PO TABS
1000.0000 mg | ORAL_TABLET | Freq: Four times a day (QID) | ORAL | Status: DC
  Administered 2020-10-23 – 2020-10-24 (×4): 1000 mg via ORAL

## 2020-10-23 MED ORDER — SENNA 8.6 MG PO TABS
1.0000 | ORAL_TABLET | Freq: Two times a day (BID) | ORAL | Status: DC
  Administered 2020-10-23: 8.6 mg via ORAL

## 2020-10-23 MED ORDER — CELECOXIB 200 MG PO CAPS
400.0000 mg | ORAL_CAPSULE | ORAL | Status: AC
  Administered 2020-10-23: 400 mg via ORAL

## 2020-10-23 MED ORDER — KETOROLAC TROMETHAMINE 30 MG/ML IJ SOLN
30.0000 mg | Freq: Once | INTRAMUSCULAR | Status: AC
  Administered 2020-10-23: 30 mg via INTRAVENOUS

## 2020-10-23 SURGICAL SUPPLY — 68 items
ADH SKN CLS APL DERMABOND .7 (GAUZE/BANDAGES/DRESSINGS) ×1
APL SRG 38 LTWT LNG FL B (MISCELLANEOUS)
APPLICATOR ARISTA FLEXITIP XL (MISCELLANEOUS) IMPLANT
BARRIER ADHS 3X4 INTERCEED (GAUZE/BANDAGES/DRESSINGS) IMPLANT
BRR ADH 4X3 ABS CNTRL BYND (GAUZE/BANDAGES/DRESSINGS)
CATH FOLEY 3WAY  5CC 16FR (CATHETERS) ×2
CATH FOLEY 3WAY 5CC 16FR (CATHETERS) ×1 IMPLANT
CELLS DAT CNTRL 66122 CELL SVR (MISCELLANEOUS) IMPLANT
COVER BACK TABLE 60X90IN (DRAPES) ×2 IMPLANT
COVER TIP SHEARS 8 DVNC (MISCELLANEOUS) ×1 IMPLANT
COVER TIP SHEARS 8MM DA VINCI (MISCELLANEOUS) ×2
DECANTER SPIKE VIAL GLASS SM (MISCELLANEOUS) ×4 IMPLANT
DEFOGGER SCOPE WARMER CLEARIFY (MISCELLANEOUS) ×2 IMPLANT
DERMABOND ADVANCED (GAUZE/BANDAGES/DRESSINGS) ×1
DERMABOND ADVANCED .7 DNX12 (GAUZE/BANDAGES/DRESSINGS) ×1 IMPLANT
DILATOR CANAL MILEX (MISCELLANEOUS) ×1 IMPLANT
DRAPE ARM DVNC X/XI (DISPOSABLE) ×4 IMPLANT
DRAPE COLUMN DVNC XI (DISPOSABLE) ×1 IMPLANT
DRAPE DA VINCI XI ARM (DISPOSABLE) ×8
DRAPE DA VINCI XI COLUMN (DISPOSABLE) ×2
DRAPE UTILITY 15X26 TOWEL STRL (DRAPES) ×2 IMPLANT
DURAPREP 26ML APPLICATOR (WOUND CARE) ×2 IMPLANT
ELECT REM PT RETURN 9FT ADLT (ELECTROSURGICAL) ×2
ELECTRODE REM PT RTRN 9FT ADLT (ELECTROSURGICAL) ×1 IMPLANT
GLOVE BIOGEL M 7.0 STRL (GLOVE) ×6 IMPLANT
GLOVE SURG UNDER POLY LF SZ7 (GLOVE) ×12 IMPLANT
HEMOSTAT ARISTA ABSORB 3G PWDR (HEMOSTASIS) IMPLANT
HOLDER FOLEY CATH W/STRAP (MISCELLANEOUS) IMPLANT
IRRIG SUCT STRYKERFLOW 2 WTIP (MISCELLANEOUS) ×2
IRRIGATION SUCT STRKRFLW 2 WTP (MISCELLANEOUS) ×1 IMPLANT
KIT TURNOVER CYSTO (KITS) ×2 IMPLANT
LEGGING LITHOTOMY PAIR STRL (DRAPES) ×2 IMPLANT
OBTURATOR OPTICAL STANDARD 8MM (TROCAR)
OBTURATOR OPTICAL STND 8 DVNC (TROCAR)
OBTURATOR OPTICALSTD 8 DVNC (TROCAR) IMPLANT
OCCLUDER COLPOPNEUMO (BALLOONS) ×2 IMPLANT
PACK ROBOT WH (CUSTOM PROCEDURE TRAY) ×2 IMPLANT
PACK ROBOTIC GOWN (GOWN DISPOSABLE) ×2 IMPLANT
PACK TRENDGUARD 450 HYBRID PRO (MISCELLANEOUS) IMPLANT
PAD OB MATERNITY 4.3X12.25 (PERSONAL CARE ITEMS) ×2 IMPLANT
PAD PREP 24X48 CUFFED NSTRL (MISCELLANEOUS) ×2 IMPLANT
PROTECTOR NERVE ULNAR (MISCELLANEOUS) ×2 IMPLANT
RETRACTOR WND ALEXIS 18 MED (MISCELLANEOUS) IMPLANT
RTRCTR WOUND ALEXIS 18CM MED (MISCELLANEOUS)
RTRCTR WOUND ALEXIS 18CM SML (INSTRUMENTS)
SAVER CELL AAL HAEMONETICS (INSTRUMENTS) IMPLANT
SCISSORS LAP 5X45 EPIX DISP (ENDOMECHANICALS) IMPLANT
SEAL CANN UNIV 5-8 DVNC XI (MISCELLANEOUS) ×4 IMPLANT
SEAL XI 5MM-8MM UNIVERSAL (MISCELLANEOUS) ×8
SEALER VESSEL DA VINCI XI (MISCELLANEOUS) ×2
SEALER VESSEL EXT DVNC XI (MISCELLANEOUS) ×1 IMPLANT
SET IRRIG Y TYPE TUR BLADDER L (SET/KITS/TRAYS/PACK) IMPLANT
SET TRI-LUMEN FLTR TB AIRSEAL (TUBING) ×1 IMPLANT
SUT VIC AB 0 CT1 27 (SUTURE) ×4
SUT VIC AB 0 CT1 27XBRD ANBCTR (SUTURE) ×2 IMPLANT
SUT VICRYL 0 UR6 27IN ABS (SUTURE) ×1 IMPLANT
SUT VICRYL RAPIDE 4/0 PS 2 (SUTURE) ×5 IMPLANT
SUT VLOC 180 0 9IN  GS21 (SUTURE) ×2
SUT VLOC 180 0 9IN GS21 (SUTURE) ×1 IMPLANT
TIP RUMI ORANGE 6.7MMX12CM (TIP) IMPLANT
TIP UTERINE 5.1X6CM LAV DISP (MISCELLANEOUS) IMPLANT
TIP UTERINE 6.7X10CM GRN DISP (MISCELLANEOUS) ×2 IMPLANT
TIP UTERINE 6.7X6CM WHT DISP (MISCELLANEOUS) IMPLANT
TIP UTERINE 6.7X8CM BLUE DISP (MISCELLANEOUS) IMPLANT
TOWEL OR 17X26 10 PK STRL BLUE (TOWEL DISPOSABLE) ×2 IMPLANT
TRENDGUARD 450 HYBRID PRO PACK (MISCELLANEOUS) ×2
TROCAR PORT AIRSEAL 8X120 (TROCAR) ×2 IMPLANT
WATER STERILE IRR 1000ML POUR (IV SOLUTION) ×2 IMPLANT

## 2020-10-23 NOTE — Transfer of Care (Signed)
Immediate Anesthesia Transfer of Care Note  Patient: Virginia Mcdonald  Procedure(s) Performed: XI ROBOTIC ASSISTED LAPAROSCOPIC HYSTERECTOMY AND BILATERAL SALPINGECTOMY (Bilateral )  Patient Location: PACU  Anesthesia Type:General  Level of Consciousness: awake, drowsy and patient cooperative  Airway & Oxygen Therapy: Patient Spontanous Breathing and Patient connected to nasal cannula oxygen  Post-op Assessment: Report given to RN and Post -op Vital signs reviewed and stable  Post vital signs: Reviewed and stable  Last Vitals:  Vitals Value Taken Time  BP 115/80 10/23/20 1005  Temp    Pulse 66   Resp 10 10/23/20 1008  SpO2 100%   Vitals shown include unvalidated device data.  Last Pain:  Vitals:   10/23/20 0633  TempSrc: Oral  PainSc: 0-No pain      Patients Stated Pain Goal: 5 (09/73/53 2992)  Complications: No complications documented.

## 2020-10-23 NOTE — H&P (Signed)
Date of Initial H&P: 10/22/2020 History reviewed, patient examined, no change in status, stable for surgery.

## 2020-10-23 NOTE — Op Note (Signed)
10/23/2020  10:00 AM  PATIENT:  Virginia Mcdonald  45 y.o. female  PRE-OPERATIVE DIAGNOSIS:  Fibroids,MENenorrhagia with irregular cycle  POST-OPERATIVE DIAGNOSIS:  Fibroids,MENenorrhagia with irregular cycle  PROCEDURE:  Procedure(s): XI ROBOTIC ASSISTED LAPAROSCOPIC HYSTERECTOMY AND BILATERAL SALPINGECTOMY (Bilateral)  SURGEON:  Surgeon(s) and Role:    Christophe Louis, MD - Primary    Delora Fuel, Lenna Sciara, DO - Assisting due to complexity of the anatomy and concern for pelvic adhesive disease  PHYSICIAN ASSISTANT: none  ASSISTANTS: Dr. Drema Dallas assisted due to the complexity of the anatomy and concern for pelvic adhesive disease   ANESTHESIA:   general  EBL:  20 mL   BLOOD ADMINISTERED:none  DRAINS: Urinary Catheter (Foley)   LOCAL MEDICATIONS USED:  OTHER Ropivicaine  SPECIMEN:  Source of Specimen:  Uterus cervix and bilateral fallopian tubes   DISPOSITION OF SPECIMEN:  PATHOLOGY  COUNTS:  YES  TOURNIQUET:  * No tourniquets in log *  DICTATION: .Dragon Dictation  PLAN OF CARE: Admit for overnight observation  PATIENT DISPOSITION:  PACU - hemodynamically stable.   Delay start of Pharmacological VTE agent (>24hrs) due to surgical blood loss or risk of bleeding: not applicable   Findings: enlarged Fibroid uterus. Normal appearing fallopian tubes and ovaries.   Procedure: The patient was taken to the operating room where she was placed under general anesthesia.Time out was performed. Marland Kitchen She was placed in dorsal lithotomy position and prepped and draped in the usual sterile fashion. A weighted speculum was placed into the vagina. A Deaver was placed anteriorly for retraction. The anterior lip of the cervix was grasped with a single-tooth tenaculum. The vaginal mucosa was injected with 2.5 cc of ropivacaine at the 2/4/ 8 and 10 o'clock positions. The uterus was sounded to 10 cm. the cervix was dilated to 6 mm . 0 vicryl suture placed at the 12 and 6:00 positions Of  the cervix to facilitate placement of a Ru mi uterine manipulator. The manipulator was placed without difficulty. Weighted speculum and Deaver were removed .  Attention was turned to the patient's abdomen where a 8 mm trocar was placed 2 cm above the umbilicus. under direct visualization . The pneumoperitoneum was achieved with PCO2 gas. The laparoscope was removed. 60 cc of ropivacaine were injected into the abdominal cavity. The laparoscope was reinserted. An 8 mm trocar was placed in the right upper quadrant 16 centimeters from the umbilicus.later connected to robotic arm #4). An 8MM incision was made in the Right upper quadrant TROCAR WAS PLACED 8 cm from the umbilicus. Later connected to robotic arm #3. An 8 mm incision was made in the left upper quadrant 16 cm from the umbilicus and connected to robot arm #1. Marland Kitchen Attention was turned to the left upper quadrant where a 8 mm midclavicular assistant trocar was placed. ( All incision sites were injected with 10cc of ropivacaine prior to port placement. )  Once all ports had been placed under direct visualization.The laparoscope was removed and the [place] robotic system was thin right-sided docked. The robotic arms were connected to the corresponding trocars as listed above. The laparoscope was then reinserted. The long tip bipolar forceps were placed into port #1. The  prograsp  placed in the port #4. A vessel sealer was placed in port #3. All instruments were directed into the pelvis under direct visualization.  Attention was turned to the surgeons console.. The left mesosalpinx and left utero-ovarian ligament was cauterized and transected with the vessel sealer The broad  ligament was cauterized and transected with the vessel sealer .The round ligament was cauterized and transected with the vessel sealer  The anterior leaf of broad ligament was incised along the bladder reflection to the midline.  The right  mesosalpinx and right utero-ovarian ligament was  cauterized and transected with the vessel sealer. The right broad ligament was cauterized and transected with the vessel sealer. The right round ligament was cauterized and transected with the vessel sealer The broad ligament was incised to the midline. The bladder was dissected off the lower uterine segments of the cervix via sharp and blunt dissection.   The uterine arteries were skeleton bilaterally. They were  cauterized and transected with the vessel sealer The KOH ring was identified. The anterior colpotomy was performed followed by the posterior colpotomy. Once the uterus,cervix and bilateral fallopian tubes were completely excised was removed through the vagina. The  bipolar forceps and scissors were removed and log tip forceps were placed in the port #1 and the  needle driver was placed in to port #3.  The vaginal cuff was closed with running suture if 0 v-lock. The pelvis was irrigated. Marland KitchenMarland KitchenMarland KitchenExcellent hemostasis was noted. Arista was placed along the vaginal cuff.  All pelvic pedicles were examined and hemostasis was noted.  All instruments removed from the ports. All ports were removed under direct Visualization. The pneumoperitoneum was released. The skin incisions were closed with 4-0 Vicryl and then covered with Derma bond.     Sponge lap and needle counts weIre correct x. The patient was awakened from anesthesia and taken to the recovery room in stable condition.

## 2020-10-23 NOTE — Anesthesia Procedure Notes (Signed)
Procedure Name: Intubation Date/Time: 10/23/2020 7:54 AM Performed by: Raenette Rover, CRNA Pre-anesthesia Checklist: Patient identified, Emergency Drugs available, Suction available and Patient being monitored Patient Re-evaluated:Patient Re-evaluated prior to induction Oxygen Delivery Method: Circle system utilized Preoxygenation: Pre-oxygenation with 100% oxygen Induction Type: IV induction Ventilation: Mask ventilation without difficulty Laryngoscope Size: Mac and 3 Grade View: Grade I Tube type: Oral Tube size: 7.0 mm Number of attempts: 1 Airway Equipment and Method: Stylet Placement Confirmation: ETT inserted through vocal cords under direct vision,  positive ETCO2 and breath sounds checked- equal and bilateral Secured at: 21 cm Tube secured with: Tape Dental Injury: Teeth and Oropharynx as per pre-operative assessment

## 2020-10-24 ENCOUNTER — Encounter (HOSPITAL_BASED_OUTPATIENT_CLINIC_OR_DEPARTMENT_OTHER): Payer: Self-pay | Admitting: Obstetrics and Gynecology

## 2020-10-24 DIAGNOSIS — D259 Leiomyoma of uterus, unspecified: Secondary | ICD-10-CM | POA: Diagnosis not present

## 2020-10-24 LAB — CBC
HCT: 31.5 % — ABNORMAL LOW (ref 36.0–46.0)
Hemoglobin: 10.2 g/dL — ABNORMAL LOW (ref 12.0–15.0)
MCH: 26.3 pg (ref 26.0–34.0)
MCHC: 32.4 g/dL (ref 30.0–36.0)
MCV: 81.2 fL (ref 80.0–100.0)
Platelets: 162 10*3/uL (ref 150–400)
RBC: 3.88 MIL/uL (ref 3.87–5.11)
RDW: 16.4 % — ABNORMAL HIGH (ref 11.5–15.5)
WBC: 7.5 10*3/uL (ref 4.0–10.5)
nRBC: 0 % (ref 0.0–0.2)

## 2020-10-24 LAB — SURGICAL PATHOLOGY

## 2020-10-24 MED ORDER — ACETAMINOPHEN 500 MG PO TABS
ORAL_TABLET | ORAL | Status: AC
  Filled 2020-10-24: qty 2

## 2020-10-24 MED ORDER — IBUPROFEN 800 MG PO TABS
ORAL_TABLET | ORAL | Status: AC
  Filled 2020-10-24: qty 1

## 2020-10-24 NOTE — Anesthesia Postprocedure Evaluation (Signed)
Anesthesia Post Note  Patient: Virginia Mcdonald  Procedure(s) Performed: XI ROBOTIC ASSISTED LAPAROSCOPIC HYSTERECTOMY AND BILATERAL SALPINGECTOMY (Bilateral )     Patient location during evaluation: PACU Anesthesia Type: General Level of consciousness: awake and alert Pain management: pain level controlled Vital Signs Assessment: post-procedure vital signs reviewed and stable Respiratory status: spontaneous breathing, nonlabored ventilation, respiratory function stable and patient connected to nasal cannula oxygen Cardiovascular status: blood pressure returned to baseline and stable Postop Assessment: no apparent nausea or vomiting Anesthetic complications: no   No complications documented.  Last Vitals:  Vitals:   10/24/20 0615 10/24/20 0800  BP: (!) 102/55 116/73  Pulse: 80 82  Resp: 16 16  Temp: 36.9 C 36.8 C  SpO2: 100% 99%    Last Pain:  Vitals:   10/24/20 0800  TempSrc:   PainSc: 0-No pain   Pain Goal: Patients Stated Pain Goal: 5 (10/23/20 1402)                 Nyzier Boivin

## 2020-10-24 NOTE — Discharge Summary (Signed)
Physician Discharge Summary  Patient ID: Virginia Mcdonald MRN: [id number] DOB/AGE: February 19, 1976 45 y.o.  Admit date: 10/23/2020 Discharge date: 10/24/2020  Admission Diagnoses: Fibroids/ menorrhagia   Discharge Diagnoses:  Active Problems:   Fibroids   S/P hysterectomy   Discharged Condition: stable  Hospital Course: pt was admitted for observation after undergoing a robotic assisted laparoscopic hysterectomy with biateral salpingectomy. She did well postoperatively with return of bowel and bladder function pain is well controlled with ibuprofen and tylenol   Consults: None  Significant Diagnostic Studies: labs: hgb pod #1 10.2  Treatments: surgery: robotic assisted laparoscpopic hysterectomy with bilateral salpingectomy   Discharge Exam: Blood pressure (!) 102/55, pulse 80, temperature 98.5 F (36.9 C), resp. rate 16, height 5' (1.524 m), weight 61.8 kg, last menstrual period 10/05/2020, SpO2 100 %. General appearance: alert, cooperative and no distress GI: soft appropriately tender nondistended  no rebound no guarding  Extremities: extremities normal, atraumatic, no cyanosis or edema Incision/Wound: well approximated no erythema or exudate   Disposition: Discharge disposition: 01-Home or Self Care       Discharge Instructions    Call MD for:  persistant nausea and vomiting   Complete by: As directed    Call MD for:  redness, tenderness, or signs of infection (pain, swelling, redness, odor or green/yellow discharge around incision site)   Complete by: As directed    Call MD for:  severe uncontrolled pain   Complete by: As directed    Call MD for:  temperature >100.4   Complete by: As directed    Diet Carb Modified   Complete by: As directed    Driving Restrictions   Complete by: As directed    Avoid driving for 1 week   Increase activity slowly   Complete by: As directed    Lifting restrictions   Complete by: As directed    Avoid lifting over 10 lbs   May  shower / Bathe   Complete by: As directed    May walk up steps   Complete by: As directed    No dressing needed   Complete by: As directed    No wound care   Complete by: As directed    Sexual Activity Restrictions   Complete by: As directed    Avoid sexual activity     Allergies as of 10/24/2020      Reactions   Penicillins Anaphylaxis   Procardia [nifedipine] Palpitations      Medication List    TAKE these medications   Accu-Chek FastClix Lancets Misc Use as directed to check blood sugars 1 time per day dx: e11.65   B COMPLEX (LIPOTROPICS) PO Take 1 Syringe by mouth every 14 (fourteen) days. Lipotropic Shots received every 2 weeks at MD office   glucose blood test strip Commonly known as: Accu-Chek Guide Use as instructed to check blood sugars 1 time per day dx: e11.65   ibuprofen 800 MG tablet Commonly known as: ADVIL Take 1 tablet (800 mg total) by mouth every 8 (eight) hours as needed.   oxyCODONE 5 MG immediate release tablet Commonly known as: Oxy IR/ROXICODONE Take 1-2 tablets (5-10 mg total) by mouth every 4 (four) hours as needed for up to 7 days for moderate pain.   Vitamin D-3 125 MCG (5000 UT) Tabs Take 5,000 Units by mouth as needed.            Discharge Care Instructions  (From admission, onward)         Start  Ordered   10/23/20 0000  No dressing needed        10/23/20 2223          Follow-up Information    Christophe Louis, MD. Go in 2 week(s).   Specialty: Obstetrics and Gynecology Contact information: [address] [place] [address] [phone]               Signed: Christophe Louis 10/24/2020, 7:18 AM

## 2020-10-24 NOTE — Discharge Instructions (Signed)
Laparoscopically Assisted Vaginal Hysterectomy, Care After The following information offers guidance on how to care for yourself after your procedure. Your health care provider may also give you more specific instructions. If you have problems or questions, contact your health care provider. What can I expect after the procedure? After the procedure, it is common to have:  Soreness and numbness in your incision areas.  Abdominal pain. You will be given pain medicine to control it.  Vaginal bleeding and discharge. You will need to use a sanitary pad after this procedure.  Tiredness (fatigue).  Poor appetite.  Less interest in sex.  Feelings of sadness or other emotions. If your ovaries were also removed, it is common to have symptoms of menopause, such as hot flashes, night sweats, and lack of sleep (insomnia). Follow these instructions at home: Medicines  Take over-the-counter and prescription medicines only as told by your health care provider.  Do not take aspirin or NSAIDs, such as ibuprofen. These medicines can cause bleeding.  Ask your health care provider if the medicine prescribed to you: ? Requires you to avoid driving or using heavy machinery. ? Can cause constipation. You may need to take these actions to prevent or treat constipation:  Drink enough fluid to keep your urine pale yellow.  Take over-the-counter or prescription medicines.  Eat foods that are high in fiber, such as beans, whole grains, and fresh fruits and vegetables.  Limit foods that are high in fat and processed sugars, such as fried or sweet foods. Incision care  Follow instructions from your health care provider about how to take care of your incisions. Make sure you: ? Wash your hands with soap and water for at least 20 seconds before and after you change your bandage (dressing). If soap and water are not available, use hand sanitizer. ? Change your dressing as told by your health care  provider. ? Leave stitches (sutures), skin glue, or adhesive strips in place. These skin closures may need to stay in place for 2 weeks or longer. If adhesive strip edges start to loosen and curl up, you may trim the loose edges. Do not remove adhesive strips completely unless your health care provider tells you to do that.  Check your incision areas every day for signs of infection. Check for: ? More redness, swelling, or pain. ? Fluid or blood. ? Warmth. ? Pus or a bad smell.   Activity  Rest as told by your healthcare provider.  Return to your normal activities as told by your health care provider. Ask your health care provider what activities are safe for you.  Avoid sitting for a long time without moving. Get up to take short walks every 1-2 hours. This is important to improve blood flow and breathing. Ask for help if you feel weak or unsteady.  Do not lift anything that is heavier than 10 lb (4.5 kg), or the limit that you are told, until your health care provider says that it is safe.  If you were given a sedative during the procedure, it can affect you for several hours. Do not drive or operate machinery until your health care provider says that it is safe.   Lifestyle  Do not use any products that contain nicotine or tobacco. These products include cigarettes, chewing tobacco, and vaping devices, such as e-cigarettes. These can delay healing after surgery. If you need help quitting, ask your health care provider.  Do not drink alcohol until your health care provider approves. General  instructions  Do not douche, use tampons, or have sex for at least 6 weeks, or as told by your health care provider.  If you struggle with physical or emotional changes after your procedure, speak with your health care provider or a therapist.  Do not take baths, swim, or use a hot tub until your health care provider approves. You may only be allowed to take showers for 2-3 weeks.  Keep your  dressing dry until your health care provider says it can be removed.  Try to have someone at home with you for the first 1-2 weeks to help with your daily chores.  Wear compression stockings as told by your health care provider. These stockings help to prevent blood clots and reduce swelling in your legs.  Keep all follow-up visits. This is important.   Contact a health care provider if:  You have any of these signs of infection: ? More redness, swelling, warmth, or pain around an incision. ? Fluid or blood coming from an incision. ? Pus or a bad smell coming from an incision. ? Chills or a fever.  An incision opens.  Your pain medicine is not helping.  You feel dizzy or light-headed.  You have pain or bleeding when you urinate.  You have nausea and vomiting that does not go away.  You have pus, or a bad-smelling discharge coming from your vagina. Get help right away if:  You have a fever and your symptoms suddenly get worse.  You have severe abdominal pain.  You have chest pain.  You have shortness of breath.  You faint.  You have pain, swelling, or redness in your leg.  You have heavy vaginal bleeding and blood clots, soaking through a sanitary pad in less than 1 hour. These symptoms may represent a serious problem that is an emergency. Do not wait to see if the symptoms will go away. Get medical help right away. Call your local emergency services (911 in the [place] Do not drive yourself to the hospital. Summary  After the procedure, it is common to have abdominal pain and vaginal bleeding.  Wear a sanitary pad for vaginal discharge or bleeding.  You should not drive or lift heavy objects until your health care provider says that it is safe.  Contact your health care provider if you have any symptoms of infection, heavy vaginal bleeding, nausea, vomiting, or shortness of breath. This information is not intended to replace advice given to you by your health care  provider. Make sure you discuss any questions you have with your health care provider. Document Revised: 04/27/2020 Document Reviewed: 04/27/2020 Elsevier Patient Education  [place].

## 2020-11-06 ENCOUNTER — Other Ambulatory Visit (INDEPENDENT_AMBULATORY_CARE_PROVIDER_SITE_OTHER): Payer: 59

## 2020-11-06 ENCOUNTER — Other Ambulatory Visit: Payer: Self-pay

## 2020-11-06 ENCOUNTER — Encounter: Payer: Self-pay | Admitting: Internal Medicine

## 2020-11-06 ENCOUNTER — Ambulatory Visit (INDEPENDENT_AMBULATORY_CARE_PROVIDER_SITE_OTHER): Payer: 59 | Admitting: Internal Medicine

## 2020-11-06 VITALS — BP 120/76 | HR 98 | Temp 98.1°F | Ht 60.0 in | Wt 138.4 lb

## 2020-11-06 DIAGNOSIS — E7800 Pure hypercholesterolemia, unspecified: Secondary | ICD-10-CM | POA: Diagnosis not present

## 2020-11-06 DIAGNOSIS — R8290 Unspecified abnormal findings in urine: Secondary | ICD-10-CM

## 2020-11-06 DIAGNOSIS — N924 Excessive bleeding in the premenopausal period: Secondary | ICD-10-CM | POA: Diagnosis not present

## 2020-11-06 LAB — POCT URINALYSIS DIPSTICK
Bilirubin, UA: NEGATIVE
Blood, UA: NEGATIVE
Glucose, UA: NEGATIVE
Ketones, UA: NEGATIVE
Leukocytes, UA: NEGATIVE
Nitrite, UA: NEGATIVE
Protein, UA: NEGATIVE
Spec Grav, UA: 1.02 (ref 1.010–1.025)
Urobilinogen, UA: 2 E.U./dL — AB
pH, UA: 7 (ref 5.0–8.0)

## 2020-11-06 NOTE — Progress Notes (Signed)
I,Katawbba Wiggins,acting as a Education administrator for Maximino Greenland, MD.,have documented all relevant documentation on the behalf of Maximino Greenland, MD,as directed by  Maximino Greenland, MD while in the presence of Maximino Greenland, MD.  This visit occurred during the SARS-CoV-2 public health emergency.  Safety protocols were in place, including screening questions prior to the visit, additional usage of staff PPE, and extensive cleaning of exam room while observing appropriate contact time as indicated for disinfecting solutions.  Subjective:     Patient ID: Virginia Mcdonald , female    DOB: 1975-12-11 , 45 y.o.   MRN: [id number]   Chief Complaint  Patient presents with  . Diabetes  . Follow-up    HPI  The patient is here today for re-evaluation of abnormal urine.  Since her last visit, she has had partial hysterectomy. She denies having any post-operative complications.       Past Medical History:  Diagnosis Date  . Diet-controlled diabetes mellitus ([place])    type 2  . Fibroids      Family History  Problem Relation Age of Onset  . Diabetes Mother   . Hypertension Mother   . Hypertension Father   . Hypertension Sister   . Diabetes Sister   . Healthy Brother   . Healthy Daughter   . Healthy Brother   . Healthy Brother   . Healthy Daughter   . Healthy Daughter      Current Outpatient Medications:  .  ACCU-CHEK FASTCLIX LANCETS MISC, Use as directed to check blood sugars 1 time per day dx: e11.65, Disp: 50 each, Rfl: 11 .  Cholecalciferol (VITAMIN D-3) 125 MCG (5000 UT) TABS, Take 5,000 Units by mouth as needed., Disp: , Rfl:  .  glucose blood (ACCU-CHEK GUIDE) test strip, Use as instructed to check blood sugars 1 time per day dx: e11.65, Disp: 50 each, Rfl: 11 .  ibuprofen (ADVIL) 800 MG tablet, Take 1 tablet (800 mg total) by mouth every 8 (eight) hours as needed., Disp: 30 tablet, Rfl: 1 .  Vitamins-Lipotropics (B COMPLEX, LIPOTROPICS, PO), Take 1 Syringe by mouth every 14  (fourteen) days. Lipotropic Shots received every 2 weeks at MD office, Disp: , Rfl:    Allergies  Allergen Reactions  . Penicillins Anaphylaxis  . Procardia [Nifedipine] Palpitations     Review of Systems  Constitutional: Negative.   Respiratory: Negative.   Cardiovascular: Negative.   Gastrointestinal: Negative.   Psychiatric/Behavioral: Negative.   All other systems reviewed and are negative.    Today's Vitals   11/06/20 1430  BP: 120/76  Pulse: 98  Temp: 98.1 F (36.7 C)  TempSrc: Oral  Weight: 138 lb 6.4 oz (62.8 kg)  Height: 5' (1.524 m)   Body mass index is 27.03 kg/m.  Wt Readings from Last 3 Encounters:  11/06/20 138 lb 6.4 oz (62.8 kg)  10/23/20 136 lb 3.2 oz (61.8 kg)  10/19/20 131 lb (59.4 kg)   Objective:  Physical Exam Vitals and nursing note reviewed.  Constitutional:      Appearance: Normal appearance. She is obese.  HENT:     Head: Normocephalic and atraumatic.  Cardiovascular:     Rate and Rhythm: Normal rate and regular rhythm.     Heart sounds: Normal heart sounds.  Pulmonary:     Breath sounds: Normal breath sounds.  Skin:    General: Skin is warm.  Neurological:     General: No focal deficit present.     Mental Status: She  is alert and oriented to person, place, and time.         Assessment And Plan:     1. Abnormal urine finding Comments: I will check repeat urinalysis to confirm proteinuria. This was present at a previous visit.   2. Excessive bleeding in premenopausal period Comments: She is s/p laparoscopic hysterectomy w/ b/l salpingectomy.   3. Pure hypercholesterolemia Comments: Previous results reviewed, LDL 152 in Jan 2022. Despite risks associated w/ high chol, she does not wish to start statin therapy. On natural supplement instead.     Patient was given opportunity to ask questions. Patient verbalized understanding of the plan and was able to repeat key elements of the plan. All questions were answered to their  satisfaction.   I, Maximino Greenland, MD, have reviewed all documentation for this visit. The documentation on 12/06/20 for the exam, diagnosis, procedures, and orders are all accurate and complete.  THE PATIENT IS ENCOURAGED TO PRACTICE SOCIAL DISTANCING DUE TO THE COVID-19 PANDEMIC.

## 2021-01-10 ENCOUNTER — Encounter: Payer: Self-pay | Admitting: Internal Medicine

## 2021-01-22 ENCOUNTER — Other Ambulatory Visit: Payer: Self-pay

## 2021-01-22 ENCOUNTER — Encounter: Payer: Self-pay | Admitting: Internal Medicine

## 2021-01-22 ENCOUNTER — Ambulatory Visit (INDEPENDENT_AMBULATORY_CARE_PROVIDER_SITE_OTHER): Payer: 59 | Admitting: Internal Medicine

## 2021-01-22 VITALS — BP 110/66 | HR 74 | Temp 98.3°F | Ht 60.0 in | Wt 137.8 lb

## 2021-01-22 DIAGNOSIS — R232 Flushing: Secondary | ICD-10-CM

## 2021-01-22 DIAGNOSIS — G44219 Episodic tension-type headache, not intractable: Secondary | ICD-10-CM

## 2021-01-22 DIAGNOSIS — R030 Elevated blood-pressure reading, without diagnosis of hypertension: Secondary | ICD-10-CM | POA: Diagnosis not present

## 2021-01-22 DIAGNOSIS — E1165 Type 2 diabetes mellitus with hyperglycemia: Secondary | ICD-10-CM | POA: Diagnosis not present

## 2021-01-22 NOTE — Patient Instructions (Signed)
Diabetes Mellitus and Exercise Exercising regularly is important for overall health, especially for people who have diabetes mellitus. Exercising is not only about losing weight. It has many other health benefits, such as increasing muscle strength and bone density and reducing body fat and stress. This leads to improved fitness, flexibility, and endurance, all of which result in better overall health. What are the benefits of exercise if I have diabetes? Exercise has many benefits for people with diabetes. They include:  Helping to lower and control blood sugar (glucose).  Helping the body to respond better to the hormone insulin by improving insulin sensitivity.  Reducing how much insulin the body needs.  Lowering the risk for heart disease by: ? Lowering "bad" cholesterol and triglyceride levels. ? Increasing "good" cholesterol levels. ? Lowering blood pressure. ? Lowering blood glucose levels. What is my activity plan? Your health care provider or certified diabetes educator can help you make a plan for the type and frequency of exercise that works for you. This is called your activity plan. Be sure to:  Get at least 150 minutes of medium-intensity or high-intensity exercise each week. Exercises may include brisk walking, biking, or water aerobics.  Do stretching and strengthening exercises, such as yoga or weight lifting, at least 2 times a week.  Spread out your activity over at least 3 days of the week.  Get some form of physical activity each day. ? Do not go more than 2 days in a row without some kind of physical activity. ? Avoid being inactive for more than 90 minutes at a time. Take frequent breaks to walk or stretch.  Choose exercises or activities that you enjoy. Set realistic goals.  Start slowly and gradually increase your exercise intensity over time.   How do I manage my diabetes during exercise? Monitor your blood glucose  Check your blood glucose before and  after exercising. If your blood glucose is: ? 240 mg/dL (13.3 mmol/L) or higher before you exercise, check your urine for ketones. These are chemicals created by the liver. If you have ketones in your urine, do not exercise until your blood glucose returns to normal. ? 100 mg/dL (5.6 mmol/L) or lower, eat a snack containing 15-20 grams of carbohydrate. Check your blood glucose 15 minutes after the snack to make sure that your glucose level is above 100 mg/dL (5.6 mmol/L) before you start your exercise.  Know the symptoms of low blood glucose (hypoglycemia) and how to treat it. Your risk for hypoglycemia increases during and after exercise. Follow these tips and your health care provider's instructions  Keep a carbohydrate snack that is fast-acting for use before, during, and after exercise to help prevent or treat hypoglycemia.  Avoid injecting insulin into areas of the body that are going to be exercised. For example, avoid injecting insulin into: ? Your arms, when you are about to play tennis. ? Your legs, when you are about to go jogging.  Keep records of your exercise habits. Doing this can help you and your health care provider adjust your diabetes management plan as needed. Write down: ? Food that you eat before and after you exercise. ? Blood glucose levels before and after you exercise. ? The type and amount of exercise you have done.  Work with your health care provider when you start a new exercise or activity. He or she may need to: ? Make sure that the activity is safe for you. ? Adjust your insulin, other medicines, and food that   you eat.  Drink plenty of water while you exercise. This prevents loss of water (dehydration) and problems caused by a lot of heat in the body (heat stroke).   Where to find more information  American Diabetes Association: www.diabetes.org Summary  Exercising regularly is important for overall health, especially for people who have diabetes  mellitus.  Exercising has many health benefits. It increases muscle strength and bone density and reduces body fat and stress. It also lowers and controls blood glucose.  Your health care provider or certified diabetes educator can help you make an activity plan for the type and frequency of exercise that works for you.  Work with your health care provider to make sure any new activity is safe for you. Also work with your health care provider to adjust your insulin, other medicines, and the food you eat. This information is not intended to replace advice given to you by your health care provider. Make sure you discuss any questions you have with your health care provider. Document Revised: 05/23/2019 Document Reviewed: 05/23/2019 Elsevier Patient Education  2021 Elsevier Inc.  

## 2021-01-22 NOTE — Progress Notes (Signed)
I,Katawbba Wiggins,acting as a Education administrator for Maximino Greenland, MD.,have documented all relevant documentation on the behalf of Maximino Greenland, MD,as directed by  Maximino Greenland, MD while in the presence of Maximino Greenland, MD.  This visit occurred during the SARS-CoV-2 public health emergency.  Safety protocols were in place, including screening questions prior to the visit, additional usage of staff PPE, and extensive cleaning of exam room while observing appropriate contact time as indicated for disinfecting solutions.  Subjective:     Patient ID: Virginia Mcdonald , female    DOB: Jan 27, 1976 , 45 y.o.   MRN: [id number]   Chief Complaint  Patient presents with  . Diabetes    HPI  She is here today for f/u diabetes.  She states her sugars have been elevated recently. She denies change in diet or eating habits. Denies urinary sx, fever, chills.  She has recently been having headaches, and dizziness.  She thinks it could be related to elevated BS. Denies visual disturbances. She is not on any medication at this time. She has had side effects from metformin and Rybelsus in the past.     Headache  This is a recurrent problem. The current episode started more than 1 month ago. The problem occurs intermittently. The problem has been unchanged. The pain is located in the bilateral and parietal region. Associated symptoms include dizziness.     Past Medical History:  Diagnosis Date  . Diet-controlled diabetes mellitus ([place])    type 2  . Fibroids      Family History  Problem Relation Age of Onset  . Diabetes Mother   . Hypertension Mother   . Hypertension Father   . Hypertension Sister   . Diabetes Sister   . Healthy Brother   . Healthy Daughter   . Healthy Brother   . Healthy Brother   . Healthy Daughter   . Healthy Daughter      Current Outpatient Medications:  .  Cholecalciferol (VITAMIN D-3) 125 MCG (5000 UT) TABS, Take 5,000 Units by mouth as needed., Disp: , Rfl:  .   ibuprofen (ADVIL) 800 MG tablet, Take 1 tablet (800 mg total) by mouth every 8 (eight) hours as needed., Disp: 30 tablet, Rfl: 1 .  Vitamins-Lipotropics (B COMPLEX, LIPOTROPICS, PO), Take 1 Syringe by mouth every 14 (fourteen) days. Lipotropic Shots received every 2 weeks at MD office, Disp: , Rfl:  .  Accu-Chek FastClix Lancets MISC, Use as directed to check blood sugars 1 time per day dx: e11.65, Disp: 50 each, Rfl: 11 .  dapagliflozin propanediol (FARXIGA) 5 MG TABS tablet, Take 1 tablet (5 mg total) by mouth daily before breakfast., Disp: 30 tablet, Rfl: 1 .  glucose blood (ACCU-CHEK GUIDE) test strip, Use as instructed to check blood sugars 1 time per day dx: e11.65, Disp: 50 each, Rfl: 11   Allergies  Allergen Reactions  . Penicillins Anaphylaxis  . Procardia [Nifedipine] Palpitations     Review of Systems  Constitutional: Negative.   Respiratory: Negative.   Cardiovascular: Negative.   Gastrointestinal: Negative.   Genitourinary:       She reports she has started to have hot flashes. Had partial hysterectomy in Feb. Has yet to discuss with GYN.   Neurological: Positive for dizziness and headaches.       C/o headaches. Admits to being under a lot of stress. 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 Admits  she doesn't have a lot of down time.   Psychiatric/Behavioral: Negative.   All other systems reviewed and are negative.    Today's Vitals   01/22/21 1106  BP: 110/66  Pulse: 74  Temp: 98.3 F (36.8 C)  TempSrc: Oral  Weight: 137 lb 12.8 oz (62.5 kg)  Height: 5' (1.524 m)   Body mass index is 26.91 kg/m.  Wt Readings from Last 3 Encounters:  01/22/21 137 lb 12.8 oz (62.5 kg)  11/06/20 138 lb 6.4 oz (62.8 kg)  10/23/20 136 lb 3.2 oz (61.8 kg)   BP Readings from Last 3 Encounters:  01/22/21 110/66  11/06/20 120/76  10/24/20 116/73   Objective:  Physical Exam Vitals and nursing note reviewed.   Constitutional:      Appearance: Normal appearance.  HENT:     Head: Normocephalic and atraumatic.     Nose:     Comments: Masked     Mouth/Throat:     Comments: Masked  Cardiovascular:     Rate and Rhythm: Normal rate and regular rhythm.     Heart sounds: Normal heart sounds.  Pulmonary:     Effort: Pulmonary effort is normal.     Breath sounds: Normal breath sounds.  Skin:    General: Skin is warm.  Neurological:     General: No focal deficit present.     Mental Status: She is alert.  Psychiatric:        Mood and Affect: Mood normal.        Behavior: Behavior normal.         Assessment And Plan:     1. Uncontrolled type 2 diabetes mellitus with hyperglycemia (HCC) Comments: Chronic, prefers to manage without pharmacologic medications. However, given elevated sugars, I no longer think this is possible. Will check labs.  - Hemoglobin A1c - BMP8+EGFR  2. Episodic tension-type headache, not intractable Comments: Advised to take magnesium nightly and take BP daily. She agrees to send in weekly logs. Possibly due to stress, encouraged to incorporate stress relieving techniques throughout the work day. She will let me know if her sx persist.   3. Elevated blood pressure reading Comments: Encouraged to cut back on her salt intake. Also advised to decrease use of packaged foods which tend to be high in sodium.   4. Hot flashes Comments: She is s/p partial hysterectomy. I will check thyroid function.  - TSH   Patient was given opportunity to ask questions. Patient verbalized understanding of the plan and was able to repeat key elements of the plan. All questions were answered to their satisfaction.   I, Maximino Greenland, MD, have reviewed all documentation for this visit. The documentation on 01/22/21 for the exam, diagnosis, procedures, and orders are all accurate and complete.   IF YOU HAVE BEEN REFERRED TO A SPECIALIST, IT MAY TAKE 1-2 WEEKS TO SCHEDULE/PROCESS THE  REFERRAL. IF YOU HAVE NOT HEARD FROM US/SPECIALIST IN TWO WEEKS, PLEASE GIVE [place] A CALL AT [phone] X [phone].   THE PATIENT IS ENCOURAGED TO PRACTICE SOCIAL DISTANCING DUE TO THE COVID-19 PANDEMIC.

## 2021-01-23 ENCOUNTER — Encounter: Payer: Self-pay | Admitting: Internal Medicine

## 2021-01-23 LAB — BMP8+EGFR
BUN/Creatinine Ratio: 17 (ref 9–23)
BUN: 15 mg/dL (ref 6–24)
CO2: 23 mmol/L (ref 20–29)
Calcium: 10.2 mg/dL (ref 8.7–10.2)
Chloride: 99 mmol/L (ref 96–106)
Creatinine, Ser: 0.86 mg/dL (ref 0.57–1.00)
Glucose: 124 mg/dL — ABNORMAL HIGH (ref 65–99)
Potassium: 4.8 mmol/L (ref 3.5–5.2)
Sodium: 140 mmol/L (ref 134–144)
eGFR: 86 mL/min/{1.73_m2} (ref >59)

## 2021-01-23 LAB — TSH: TSH: 1.01 u[IU]/mL (ref 0.450–4.500)

## 2021-01-23 LAB — HEMOGLOBIN A1C
Est. average glucose Bld gHb Est-mCnc: 163 mg/dL
Hgb A1c MFr Bld: 7.3 % — ABNORMAL HIGH (ref 4.8–5.6)

## 2021-01-28 ENCOUNTER — Other Ambulatory Visit: Payer: Self-pay

## 2021-01-28 MED ORDER — ACCU-CHEK GUIDE VI STRP: ORAL_STRIP | 11 refills | Status: DC

## 2021-01-28 MED ORDER — ACCU-CHEK FASTCLIX LANCETS MISC: 11 refills | Status: DC

## 2021-01-28 MED ORDER — DAPAGLIFLOZIN PROPANEDIOL 5 MG PO TABS: 5.0000 mg | ORAL_TABLET | Freq: Every day | ORAL | 1 refills | Status: DC

## 2021-02-22 ENCOUNTER — Other Ambulatory Visit: Payer: Self-pay | Admitting: Nurse Practitioner

## 2021-02-22 ENCOUNTER — Encounter: Payer: Self-pay | Admitting: Internal Medicine

## 2021-02-22 DIAGNOSIS — U071 COVID-19: Secondary | ICD-10-CM

## 2021-02-22 MED ORDER — PAXLOVID 10 X 150 MG & 10 X 100MG PO TBPK: 2.0000 | ORAL_TABLET | Freq: Two times a day (BID) | ORAL | 0 refills | Status: AC

## 2021-02-24 ENCOUNTER — Other Ambulatory Visit: Payer: Self-pay

## 2021-02-24 ENCOUNTER — Emergency Department (HOSPITAL_BASED_OUTPATIENT_CLINIC_OR_DEPARTMENT_OTHER)
Admission: EM | Admit: 2021-02-24 | Discharge: 2021-02-24 | Disposition: A | Discharge status: Home / Self Care | Payer: 59 | Attending: Emergency Medicine | Admitting: Emergency Medicine

## 2021-02-24 ENCOUNTER — Encounter (HOSPITAL_BASED_OUTPATIENT_CLINIC_OR_DEPARTMENT_OTHER): Payer: Self-pay | Admitting: Emergency Medicine

## 2021-02-24 ENCOUNTER — Ambulatory Visit: Admission: EM | Admit: 2021-02-24 | Discharge: 2021-02-24 | Discharge status: Absent without leave | Payer: 59

## 2021-02-24 DIAGNOSIS — W1839XA Other fall on same level, initial encounter: Secondary | ICD-10-CM | POA: Diagnosis not present

## 2021-02-24 DIAGNOSIS — Y93K1 Activity, walking an animal: Secondary | ICD-10-CM | POA: Insufficient documentation

## 2021-02-24 DIAGNOSIS — S0990XA Unspecified injury of head, initial encounter: Secondary | ICD-10-CM | POA: Diagnosis present

## 2021-02-24 DIAGNOSIS — E119 Type 2 diabetes mellitus without complications: Secondary | ICD-10-CM | POA: Insufficient documentation

## 2021-02-24 DIAGNOSIS — W19XXXA Unspecified fall, initial encounter: Secondary | ICD-10-CM

## 2021-02-24 NOTE — ED Provider Notes (Signed)
Emergency Department Provider Note   I have reviewed the triage vital signs and the nursing notes.   HISTORY  Chief Complaint Fall (Head injury)   HPI Virginia Mcdonald is a 45 y.o. female presents to the ED with right occipital pain after fall. She fell while walking her dog without LOC.  Patient states her dog pulled to another dog.  It surprised her and pulled her down.  She fell backwards landing on the concrete.  She states she hit the back of her head hard.  She denies any bleeding.  No pain in her neck or back.  No numbness or weakness.  She has been ambulatory without difficulty but has had some headache following the event. She is not anticoagulated.    Past Medical History:  Diagnosis Date   Diet-controlled diabetes mellitus (Virginia Mcdonald)    type 2   Fibroids     Patient Active Problem List   Diagnosis Date Noted   Fibroids 10/23/2020   S/P hysterectomy 10/23/2020   Newly diagnosed diabetes ([place]) 07/06/2018    Past Surgical History:  Procedure Laterality Date   CESAREAN SECTION  2003   ROBOTIC ASSISTED LAPAROSCOPIC HYSTERECTOMY AND SALPINGECTOMY Bilateral 10/23/2020   Procedure: XI ROBOTIC ASSISTED LAPAROSCOPIC HYSTERECTOMY AND BILATERAL SALPINGECTOMY;  Surgeon: Christophe Louis, MD;  Location: [facility];  Service: Gynecology;  Laterality: Bilateral;    Allergies Penicillins and Procardia [nifedipine]  Family History  Problem Relation Age of Onset   Diabetes Mother    Hypertension Mother    Hypertension Father    Hypertension Sister    Diabetes Sister    Health and safety inspector    Healthy Daughter    Healthy Brother    Healthy Brother    Healthy Daughter    Healthy Daughter     Social History Social History   Tobacco Use   Smoking status: Never   Smokeless tobacco: Never  Vaping Use   Vaping Use: Never used  Substance Use Topics   Alcohol use: Never   Drug use: Never    Review of Systems  Constitutional: No fever/chills Eyes: No  visual changes. ENT: No sore throat. Cardiovascular: Denies chest pain. Respiratory: Denies shortness of breath. Gastrointestinal: No abdominal pain.  No nausea, no vomiting.  No diarrhea.  No constipation. Genitourinary: Negative for dysuria. Musculoskeletal: Negative for back pain. Skin: Negative for rash. Neurological: Negative for focal weakness or numbness. Positive HA.   10-point ROS otherwise negative.  ____________________________________________   PHYSICAL EXAM:  VITAL SIGNS: ED Triage Vitals  Enc Vitals Group     BP 02/24/21 0910 99/79     Pulse Rate 02/24/21 0910 85     Resp 02/24/21 0910 12     Temp 02/24/21 0910 98.9 F (37.2 C)     Temp src --      SpO2 02/24/21 0910 99 %     Weight 02/24/21 0914 130 lb (59 kg)     Height 02/24/21 0914 5\' 2"  (1.575 m)   Constitutional: Alert and oriented. Well appearing and in no acute distress. Eyes: Conjunctivae are normal. PERRL. EOMI. Head: Atraumatic. No abrasions, lacerations, or palpable hematoma.  Nose: No congestion/rhinnorhea. Mouth/Throat: Mucous membranes are moist.  Oropharynx non-erythematous. Neck: No stridor.  No cervical spine tenderness to palpation. Cardiovascular: Normal rate, regular rhythm. Good peripheral circulation. Grossly normal heart sounds.   Respiratory: Normal respiratory effort.  No retractions. Lungs CTAB. Gastrointestinal: No distention.  Musculoskeletal: No gross deformities of extremities. Neurologic:  Normal speech and  language. No gross focal neurologic deficits are appreciated.  Skin:  Skin is warm, dry and intact. No rash noted.  ____________________________________________  RADIOLOGY  None   ____________________________________________   PROCEDURES  Procedure(s) performed:   Procedures  None  ____________________________________________   INITIAL IMPRESSION / ASSESSMENT AND PLAN / ED COURSE  Pertinent labs & imaging results that were available during my care of the  patient were reviewed by me and considered in my medical decision making (see chart for details).   Patient presents to the emergency department for evaluation after fall today while walking her dog with head injury.  She is neuro intact.  She has no outward sign of head trauma.  She is feeling well and not confused.  No vomiting. Able to clear with [place] head CT rule.  Plan for Tylenol and/or Motrin as needed.  Will follow with the primary care doctor.  If headache persist this may be related to mild concussion. Discussed ED return precautions.    ____________________________________________  FINAL CLINICAL IMPRESSION(S) / ED DIAGNOSES  Final diagnoses:  Injury of head, initial encounter  Fall, initial encounter   Note:  This document was prepared using Dragon voice recognition software and may include unintentional dictation errors.  Nanda Quinton, MD, [facility] Emergency Medicine    Amarissa Koerner, [facility], MD 02/24/21 1004

## 2021-02-24 NOTE — ED Triage Notes (Signed)
Fall today , head injury . Headache . No blood thinners.

## 2021-02-24 NOTE — Discharge Instructions (Addendum)
You were seen in the emergency room today with head injury.  I do not see any outward sign of severe injury.  You may have headache through the day which she can take Tylenol and/or Motrin as needed for pain.  If you develop sudden severe/worsening headache, confusion, vomiting should return to the emergency department for repeat evaluation.

## 2021-02-26 ENCOUNTER — Other Ambulatory Visit: Payer: Self-pay | Admitting: Internal Medicine

## 2021-02-26 ENCOUNTER — Telehealth: Payer: Self-pay

## 2021-02-26 ENCOUNTER — Ambulatory Visit
Admission: RE | Admit: 2021-02-26 | Discharge: 2021-02-26 | Disposition: A | Discharge status: Home / Self Care | Payer: 59 | Source: Ambulatory Visit | Attending: Internal Medicine | Admitting: Internal Medicine

## 2021-02-26 ENCOUNTER — Other Ambulatory Visit: Payer: Self-pay

## 2021-02-26 DIAGNOSIS — Z1231 Encounter for screening mammogram for malignant neoplasm of breast: Secondary | ICD-10-CM

## 2021-02-26 NOTE — Telephone Encounter (Signed)
Transition Care Management Follow-up Telephone Call Date of discharge and from where: 02/24/2021 How have you been since you were released from the hospital?  Still in pain, managing.  Any questions or concerns? No  Items Reviewed: Did the pt receive and understand the discharge instructions provided? Yes  Medications obtained and verified? Yes  Other? No  Any new allergies since your discharge? No  Dietary orders reviewed? Yes Do you have support at home? Yes   Home Care and Equipment/Supplies: Were home health services ordered? not applicable If so, what is the name of the agency? Not applicable.   Has the agency set up a time to come to the patient's home? not applicable Were any new equipment or medical supplies ordered?  No What is the name of the medical supply agency? N/a Were you able to get the supplies/equipment? not applicable Do you have any questions related to the use of the equipment or supplies? No  Functional Questionnaire: (I = Independent and D = Dependent) ADLs: I  Bathing/Dressing- I  Meal Prep- I  Eating- I  Maintaining continence- I  Transferring/Ambulation- I  Managing Meds- I  Follow up appointments reviewed:  PCP Hospital f/u appt confirmed? Yes  Scheduled to see Raman Ghumann on 03/05/2021 @ [facility]. Specialist Hospital f/u appt confirmed?  no  Scheduled to see n/a on n/a @ n/a. Are transportation arrangements needed? No  If their condition worsens, is the pt aware to call PCP or go to the Emergency Dept.? Yes Was the patient provided with contact information for the PCP's office or ED? Yes Was to pt encouraged to call back with questions or concerns? Yes

## 2021-03-05 ENCOUNTER — Other Ambulatory Visit: Payer: Self-pay

## 2021-03-05 ENCOUNTER — Encounter: Payer: Self-pay | Admitting: Nurse Practitioner

## 2021-03-05 ENCOUNTER — Ambulatory Visit (INDEPENDENT_AMBULATORY_CARE_PROVIDER_SITE_OTHER): Payer: 59 | Admitting: Nurse Practitioner

## 2021-03-05 VITALS — BP 132/70 | HR 77

## 2021-03-05 DIAGNOSIS — S0990XD Unspecified injury of head, subsequent encounter: Secondary | ICD-10-CM | POA: Diagnosis not present

## 2021-03-05 NOTE — Patient Instructions (Signed)

## 2021-03-05 NOTE — Progress Notes (Signed)
I,Tianna Badgett,acting as a Education administrator for [employer], NP.,have documented all relevant documentation on the behalf of [employer], NP,as directed by  Bary Castilla, NP while in the presence of Bary Castilla, NP.  This visit occurred during the SARS-CoV-2 public health emergency.  Safety protocols were in place, including screening questions prior to the visit, additional usage of staff PPE, and extensive cleaning of exam room while observing appropriate contact time as indicated for disinfecting solutions.  Subjective:     Patient ID: Virginia Mcdonald , female    DOB: 1975-12-26 , 45 y.o.   MRN: [id number]   Chief Complaint  Patient presents with   Hospitalization Follow-up    HPI  Patient was admitted into the hospital due to fall. She is feeling a lot better and has no concerns at this time. She says she has a slight little headache a times but not severe. No memory loss. No other concerns at this time.     Past Medical History:  Diagnosis Date   Diet-controlled diabetes mellitus ([place])    type 2   Fibroids      Family History  Problem Relation Age of Onset   Diabetes Mother    Hypertension Mother    Hypertension Father    Hypertension Sister    Diabetes Sister    Health and safety inspector    Healthy Daughter    Healthy Brother    Healthy Brother    Healthy Daughter    Healthy Daughter      Current Outpatient Medications:    Accu-Chek FastClix Lancets MISC, Use as directed to check blood sugars 1 time per day dx: e11.65, Disp: 50 each, Rfl: 11   Cholecalciferol (VITAMIN D-3) 125 MCG (5000 UT) TABS, Take 5,000 Units by mouth as needed., Disp: , Rfl:    dapagliflozin propanediol (FARXIGA) 5 MG TABS tablet, Take 1 tablet (5 mg total) by mouth daily before breakfast., Disp: 30 tablet, Rfl: 1   glucose blood (ACCU-CHEK GUIDE) test strip, Use as instructed to check blood sugars 1 time per day dx: e11.65, Disp: 50 each, Rfl: 11   Vitamins-Lipotropics (B COMPLEX,  LIPOTROPICS, PO), Take 1 Syringe by mouth every 14 (fourteen) days. Lipotropic Shots received every 2 weeks at MD office, Disp: , Rfl:    Allergies  Allergen Reactions   Penicillins Anaphylaxis   Procardia [Nifedipine] Palpitations     Review of Systems  Constitutional: Negative.  Negative for chills and fever.  HENT:  Negative for congestion, postnasal drip, rhinorrhea, sinus pressure and sinus pain.   Respiratory: Negative.  Negative for cough, shortness of breath and wheezing.   Cardiovascular: Negative.  Negative for chest pain and palpitations.  Gastrointestinal: Negative.   Musculoskeletal:  Negative for arthralgias and myalgias.  Neurological:  Positive for headaches. Negative for dizziness, weakness and numbness.       Headache at times     Today's Vitals   03/05/21 1607  BP: 132/70  Pulse: 77   There is no height or weight on file to calculate BMI.   Objective:  Physical Exam Constitutional:      Appearance: Normal appearance.  HENT:     Head: Normocephalic and atraumatic.  Cardiovascular:     Rate and Rhythm: Normal rate and regular rhythm.     Pulses: Normal pulses.     Heart sounds: Normal heart sounds. No murmur heard. Pulmonary:     Effort: Pulmonary effort is normal. No respiratory distress.     Breath sounds: Normal breath  sounds. No wheezing.  Skin:    General: Skin is warm and dry.     Capillary Refill: Capillary refill takes less than 2 seconds.  Neurological:     General: No focal deficit present.     Mental Status: She is alert and oriented to person, place, and time.     Motor: No weakness, tremor or pronator drift.     Gait: Gait is intact.        Assessment And Plan:     1. Injury of head, subsequent encounter  -The patient went to the hospital on 02/24/21 for a fall that she suffered and hit her head. The patient did not loose any consciousness. Reports no concerns at this time. Just came for a follow up. She reports slight headaches at  times for which tylenol or motrin is suggested. She reports no memory changes. Neuro is in intact.  -Advised the patient if headache continues she may need to come in to be fully and further evaluated for a mild concussion. Patient verbalized understanding of the plan.   The patient was encouraged to call or send a message through [place] for any questions or concerns.   Follow up: if symptoms persist or do not get better.   Patient was given opportunity to ask questions. Patient verbalized understanding of the plan and was able to repeat key elements of the plan. All questions were answered to their satisfaction.  Raman Shella Lahman, DNP   I, Raman Havannah Streat have reviewed all documentation for this visit. The documentation on 03/05/21 for the exam, diagnosis, procedures, and orders are all accurate and complete.    IF YOU HAVE BEEN REFERRED TO A SPECIALIST, IT MAY TAKE 1-2 WEEKS TO SCHEDULE/PROCESS THE REFERRAL. IF YOU HAVE NOT HEARD FROM US/SPECIALIST IN TWO WEEKS, PLEASE GIVE [place] A CALL AT [phone].   THE PATIENT IS ENCOURAGED TO PRACTICE SOCIAL DISTANCING DUE TO THE COVID-19 PANDEMIC.

## 2021-03-13 ENCOUNTER — Ambulatory Visit (INDEPENDENT_AMBULATORY_CARE_PROVIDER_SITE_OTHER): Payer: 59 | Admitting: Internal Medicine

## 2021-03-13 ENCOUNTER — Other Ambulatory Visit: Payer: Self-pay

## 2021-03-13 ENCOUNTER — Encounter: Payer: Self-pay | Admitting: Internal Medicine

## 2021-03-13 VITALS — BP 112/74 | HR 75 | Temp 98.1°F | Ht 62.0 in | Wt 136.6 lb

## 2021-03-13 DIAGNOSIS — E1165 Type 2 diabetes mellitus with hyperglycemia: Secondary | ICD-10-CM | POA: Diagnosis not present

## 2021-03-13 DIAGNOSIS — G44209 Tension-type headache, unspecified, not intractable: Secondary | ICD-10-CM

## 2021-03-13 LAB — BMP8+EGFR
BUN/Creatinine Ratio: 10 (ref 9–23)
BUN: 9 mg/dL (ref 6–24)
CO2: 22 mmol/L (ref 20–29)
Calcium: 9.6 mg/dL (ref 8.7–10.2)
Chloride: 101 mmol/L (ref 96–106)
Creatinine, Ser: 0.92 mg/dL (ref 0.57–1.00)
Glucose: 114 mg/dL — ABNORMAL HIGH (ref 65–99)
Potassium: 4.9 mmol/L (ref 3.5–5.2)
Sodium: 137 mmol/L (ref 134–144)
eGFR: 79 mL/min/{1.73_m2} (ref >59)

## 2021-03-13 MED ORDER — ACCU-CHEK GUIDE VI STRP: ORAL_STRIP | 11 refills | Status: DC

## 2021-03-13 MED ORDER — ACCU-CHEK FASTCLIX LANCETS MISC: 11 refills | Status: AC

## 2021-03-13 NOTE — Progress Notes (Signed)
I,Katawbba Wiggins,acting as a Education administrator for Maximino Greenland, MD.,have documented all relevant documentation on the behalf of Maximino Greenland, MD,as directed by  Maximino Greenland, MD while in the presence of Maximino Greenland, MD.  This visit occurred during the SARS-CoV-2 public health emergency.  Safety protocols were in place, including screening questions prior to the visit, additional usage of staff PPE, and extensive cleaning of exam room while observing appropriate contact time as indicated for disinfecting solutions.  Subjective:     Patient ID: Virginia Mcdonald , female    DOB: [birth year] , 45 y.o.   MRN: [id number]   Chief Complaint  Patient presents with   Diabetes    HPI  She is here today for f/u Farxiga. She was started on 71m at her last visit. She has not had any issues with the medication.     Past Medical History:  Diagnosis Date   Diet-controlled diabetes mellitus ([place]    type 2   Fibroids      Family History  Problem Relation Age of Onset   Diabetes Mother    Hypertension Mother    Hypertension Father    Hypertension Sister    Diabetes Sister    Healthy Brother    Healthy Daughter    Healthy Brother    Healthy Brother    Healthy Daughter    Healthy Daughter      Current Outpatient Medications:    Cholecalciferol (VITAMIN D-3) 125 MCG (5000 UT) TABS, Take 5,000 Units by mouth as needed., Disp: , Rfl:    dapagliflozin propanediol (FARXIGA) 5 MG TABS tablet, Take 1 tablet (5 mg total) by mouth daily before breakfast., Disp: 30 tablet, Rfl: 1   Vitamins-Lipotropics (B COMPLEX, LIPOTROPICS, PO), Take 1 Syringe by mouth every 14 (fourteen) days. Lipotropic Shots received every 2 weeks at MD office, Disp: , Rfl:    Accu-Chek FastClix Lancets MISC, Use as directed to check blood sugars 1 time per day dx: e11.65, Disp: 50 each, Rfl: 11   glucose blood (ACCU-CHEK GUIDE) test strip, Use as instructed to check blood sugars 1 time per day dx: e11.65, Disp: 50  each, Rfl: 11   Allergies  Allergen Reactions   Penicillins Anaphylaxis   Procardia [Nifedipine] Palpitations     Review of Systems  Constitutional: Negative.   Respiratory: Negative.    Cardiovascular: Negative.   Gastrointestinal: Negative.   Neurological:  Positive for headaches.       She c/o headaches in back of her neck. No visual disturbances. Usually occurs at night. Denies UE weakness/paresthesias.   Psychiatric/Behavioral: Negative.    All other systems reviewed and are negative.   Today's Vitals   03/13/21 0858  BP: 112/74  Pulse: 75  Temp: 98.1 F (36.7 C)  TempSrc: Oral  Weight: 136 lb 9.6 oz (62 kg)  Height: 5' 2" (1.575 m)   Body mass index is 24.98 kg/m.  Wt Readings from Last 3 Encounters:  03/13/21 136 lb 9.6 oz (62 kg)  02/24/21 130 lb (59 kg)  01/22/21 137 lb 12.8 oz (62.5 kg)    BP Readings from Last 3 Encounters:  03/13/21 112/74  03/05/21 132/70  02/24/21 99/79    Objective:  Physical Exam Vitals and nursing note reviewed.  Constitutional:      Appearance: Normal appearance.  HENT:     Head: Normocephalic and atraumatic.  Cardiovascular:     Rate and Rhythm: Normal rate and regular rhythm.     Heart  sounds: Normal heart sounds.  Pulmonary:     Effort: Pulmonary effort is normal.     Breath sounds: Normal breath sounds.  Skin:    General: Skin is warm.  Neurological:     General: No focal deficit present.     Mental Status: She is alert.  Psychiatric:        Mood and Affect: Mood normal.        Behavior: Behavior normal.        Assessment And Plan:     1. Uncontrolled type 2 diabetes mellitus with hyperglycemia (H[facility]) Comments: Chronic, I will check renal function today. I anticipate increasing her to 48m FIran She will rto in Sept 2022 for her next a1c check. I will also [facility].  - BMP8+EGFR  2. Tension headache Comments: She is advised to apply Vicks Vaporub to  base of neck/shoulders nightly.  She will let me know if her sx persist.   Patient was given opportunity to ask questions. Patient verbalized understanding of the plan and was able to repeat key elements of the plan. All questions were answered to their satisfaction.   I, RMaximino Greenland MD, have reviewed all documentation for this visit. The documentation on 03/13/21 for the exam, diagnosis, procedures, and orders are all accurate and complete.   IF YOU HAVE BEEN REFERRED TO A SPECIALIST, IT MAY TAKE 1-2 WEEKS TO SCHEDULE/PROCESS THE REFERRAL. IF YOU HAVE NOT HEARD FROM US/SPECIALIST IN TWO WEEKS, PLEASE GIVE [place] CALL AT [phone].   THE PATIENT IS ENCOURAGED TO PRACTICE SOCIAL DISTANCING DUE TO THE COVID-19 PANDEMIC.

## 2021-04-20 ENCOUNTER — Other Ambulatory Visit: Payer: Self-pay | Admitting: Internal Medicine

## 2021-05-19 ENCOUNTER — Other Ambulatory Visit: Payer: Self-pay | Admitting: Internal Medicine

## 2021-05-28 ENCOUNTER — Ambulatory Visit (INDEPENDENT_AMBULATORY_CARE_PROVIDER_SITE_OTHER): Payer: 59 | Admitting: Internal Medicine

## 2021-05-28 ENCOUNTER — Encounter: Payer: Self-pay | Admitting: Internal Medicine

## 2021-05-28 ENCOUNTER — Other Ambulatory Visit: Payer: Self-pay

## 2021-05-28 VITALS — BP 112/64 | HR 72 | Temp 98.7°F | Ht 62.0 in | Wt 134.2 lb

## 2021-05-28 DIAGNOSIS — E1165 Type 2 diabetes mellitus with hyperglycemia: Secondary | ICD-10-CM | POA: Diagnosis not present

## 2021-05-28 DIAGNOSIS — Z23 Encounter for immunization: Secondary | ICD-10-CM | POA: Diagnosis not present

## 2021-05-28 LAB — BMP8+EGFR
BUN/Creatinine Ratio: 16 (ref 9–23)
BUN: 14 mg/dL (ref 6–24)
CO2: 23 mmol/L (ref 20–29)
Calcium: 9.8 mg/dL (ref 8.7–10.2)
Chloride: 101 mmol/L (ref 96–106)
Creatinine, Ser: 0.89 mg/dL (ref 0.57–1.00)
Glucose: 109 mg/dL — ABNORMAL HIGH (ref 65–99)
Potassium: 4.4 mmol/L (ref 3.5–5.2)
Sodium: 139 mmol/L (ref 134–144)
eGFR: 82 mL/min/{1.73_m2} (ref >59)

## 2021-05-28 LAB — HEMOGLOBIN A1C
Est. average glucose Bld gHb Est-mCnc: 148 mg/dL
Hgb A1c MFr Bld: 6.8 % — ABNORMAL HIGH (ref 4.8–5.6)

## 2021-05-28 MED ORDER — ACCU-CHEK GUIDE VI STRP: ORAL_STRIP | 11 refills | Status: AC

## 2021-05-28 MED ORDER — DAPAGLIFLOZIN PROPANEDIOL 10 MG PO TABS: 10.0000 mg | ORAL_TABLET | Freq: Every day | ORAL | 5 refills | Status: DC

## 2021-05-28 NOTE — Progress Notes (Signed)
I,Katawbba Wiggins,acting as a Education administrator for Maximino Greenland, MD.,have documented all relevant documentation on the behalf of Maximino Greenland, MD,as directed by  Maximino Greenland, MD while in the presence of Maximino Greenland, MD.  This visit occurred during the SARS-CoV-2 public health emergency.  Safety protocols were in place, including screening questions prior to the visit, additional usage of staff PPE, and extensive cleaning of exam room while observing appropriate contact time as indicated for disinfecting solutions.  Subjective:     Patient ID: Virginia Mcdonald , female    DOB: 12-19-1975 , 45 y.o.   MRN: [id number]   Chief Complaint  Patient presents with   Diabetes    HPI  The patient is here today for a follow-up for diabetes.  She is now taking Farxiga 44m, she has not had any issues with the medication.   Diabetes She presents for her follow-up diabetic visit. She has type 2 diabetes mellitus. Pertinent negatives for hypoglycemia include no dizziness or headaches. Pertinent negatives for diabetes include no chest pain, no fatigue and no polyuria. Risk factors for coronary artery disease include diabetes mellitus.    Past Medical History:  Diagnosis Date   Diet-controlled diabetes mellitus ([place]    type 2   Fibroids      Family History  Problem Relation Age of Onset   Diabetes Mother    Hypertension Mother    Hypertension Father    Hypertension Sister    Diabetes Sister    HHealth and safety inspector   Healthy Daughter    Healthy Brother    Healthy Brother    Healthy Daughter    Healthy Daughter      Current Outpatient Medications:    Accu-Chek FastClix Lancets MISC, Use as directed to check blood sugars 1 time per day dx: e11.65, Disp: 50 each, Rfl: 11   Cholecalciferol (VITAMIN D-3) 125 MCG (5000 UT) TABS, Take 5,000 Units by mouth as needed., Disp: , Rfl:    dapagliflozin propanediol (FARXIGA) 10 MG TABS tablet, Take 1 tablet (10 mg total) by mouth daily before  breakfast., Disp: 30 tablet, Rfl: 5   glucose blood (ACCU-CHEK GUIDE) test strip, Use as instructed to check blood sugars 1 time per day dx: e11.65, Disp: 50 each, Rfl: 11   Vitamins-Lipotropics (B COMPLEX, LIPOTROPICS, PO), Take 1 Syringe by mouth every 14 (fourteen) days. Lipotropic Shots received every 2 weeks at MD office (Patient not taking: Reported on 05/28/2021), Disp: , Rfl:    Allergies  Allergen Reactions   Penicillins Anaphylaxis   Procardia [Nifedipine] Palpitations     Review of Systems  Constitutional: Negative.  Negative for fatigue.  Respiratory: Negative.    Cardiovascular: Negative.  Negative for chest pain.  Gastrointestinal: Negative.   Endocrine: Negative for polyuria.  Neurological:  Negative for dizziness and headaches.  Psychiatric/Behavioral: Negative.    All other systems reviewed and are negative.   Today's Vitals   05/28/21 0932  BP: 112/64  Pulse: 72  Temp: 98.7 F (37.1 C)  TempSrc: Oral  Weight: 134 lb 3.2 oz (60.9 kg)  Height: 5' 2" (1.575 m)   Body mass index is 24.55 kg/m.  Wt Readings from Last 3 Encounters:  05/28/21 134 lb 3.2 oz (60.9 kg)  03/13/21 136 lb 9.6 oz (62 kg)  02/24/21 130 lb (59 kg)   BP Readings from Last 3 Encounters:  05/28/21 112/64  03/13/21 112/74  03/05/21 132/70  . Objective:  Physical Exam Vitals and nursing note  reviewed.  Constitutional:      Appearance: Normal appearance.  HENT:     Head: Normocephalic and atraumatic.     Nose:     Comments: Masked     Mouth/Throat:     Comments: Masked  Eyes:     Extraocular Movements: Extraocular movements intact.  Cardiovascular:     Rate and Rhythm: Normal rate and regular rhythm.     Heart sounds: Normal heart sounds.  Pulmonary:     Effort: Pulmonary effort is normal.     Breath sounds: Normal breath sounds.  Skin:    General: Skin is warm.  Neurological:     General: No focal deficit present.     Mental Status: She is alert.  Psychiatric:         Mood and Affect: Mood normal.        Behavior: Behavior normal.        Assessment And Plan:     1. Uncontrolled type 2 diabetes mellitus with hyperglycemia ([place]) Comments: I will check labs as below. I will check BMP to document renal stability while on Farxiga. She is reminded to stay hydrated. She will f/u in 3-4 months.  - Hemoglobin A1c - BMP8+eGFR  2. Immunization due Comments: She was given flu vaccine.    Patient was given opportunity to ask questions. Patient verbalized understanding of the plan and was able to repeat key elements of the plan. All questions were answered to their satisfaction.   I, Maximino Greenland, MD, have reviewed all documentation for this visit. The documentation on 05/28/21 for the exam, diagnosis, procedures, and orders are all accurate and complete.   IF YOU HAVE BEEN REFERRED TO A SPECIALIST, IT MAY TAKE 1-2 WEEKS TO SCHEDULE/PROCESS THE REFERRAL. IF YOU HAVE NOT HEARD FROM US/SPECIALIST IN TWO WEEKS, PLEASE GIVE [place] A CALL AT [phone].   THE PATIENT IS ENCOURAGED TO PRACTICE SOCIAL DISTANCING DUE TO THE COVID-19 PANDEMIC.

## 2021-05-28 NOTE — Patient Instructions (Signed)

## 2021-10-15 ENCOUNTER — Other Ambulatory Visit: Payer: Self-pay

## 2021-10-15 ENCOUNTER — Encounter: Payer: Self-pay | Admitting: Internal Medicine

## 2021-10-15 ENCOUNTER — Ambulatory Visit (INDEPENDENT_AMBULATORY_CARE_PROVIDER_SITE_OTHER): Payer: 59 | Admitting: Internal Medicine

## 2021-10-15 VITALS — BP 112/70 | Temp 98.0°F | Ht 63.4 in | Wt 131.2 lb

## 2021-10-15 DIAGNOSIS — Z0000 Encounter for general adult medical examination without abnormal findings: Secondary | ICD-10-CM

## 2021-10-15 DIAGNOSIS — Z6822 Body mass index (BMI) 22.0-22.9, adult: Secondary | ICD-10-CM

## 2021-10-15 DIAGNOSIS — E1165 Type 2 diabetes mellitus with hyperglycemia: Secondary | ICD-10-CM

## 2021-10-15 DIAGNOSIS — E7800 Pure hypercholesterolemia, unspecified: Secondary | ICD-10-CM | POA: Insufficient documentation

## 2021-10-15 DIAGNOSIS — N898 Other specified noninflammatory disorders of vagina: Secondary | ICD-10-CM | POA: Insufficient documentation

## 2021-10-15 LAB — POCT URINALYSIS DIPSTICK
Bilirubin, UA: NEGATIVE
Glucose, UA: POSITIVE — AB
Ketones, UA: NEGATIVE
Leukocytes, UA: NEGATIVE
Nitrite, UA: NEGATIVE
Protein, UA: NEGATIVE
Spec Grav, UA: 1.02 (ref 1.010–1.025)
Urobilinogen, UA: 0.2 E.U./dL
pH, UA: 5 (ref 5.0–8.0)

## 2021-10-15 NOTE — Patient Instructions (Signed)

## 2021-10-15 NOTE — Progress Notes (Signed)
Rich Brave [place],acting as a Education administrator for Maximino Greenland, MD.,have documented all relevant documentation on the behalf of Maximino Greenland, MD,as directed by  Maximino Greenland, MD while in the presence of Maximino Greenland, MD.  This visit occurred during the SARS-CoV-2 public health emergency.  Safety protocols were in place, including screening questions prior to the visit, additional usage of staff PPE, and extensive cleaning of exam room while observing appropriate contact time as indicated for disinfecting solutions.  Subjective:     Patient ID: Virginia Mcdonald , female    DOB: [birth year] , 46 y.o.   MRN: [id number]   Chief Complaint  Patient presents with   Annual Exam    HPI  She is here today for a full physical examination. She is followed by Dr. Landry Mellow for her GYN exams.  She is s/p hysterectomy.  She has no specific concerns or complaints at this time.   Diabetes She presents for her follow-up diabetic visit. She has type 2 diabetes mellitus. Her disease course has been stable. There are no hypoglycemic associated symptoms. There are no diabetic associated symptoms. There are no diabetic complications. Risk factors for coronary artery disease include diabetes mellitus and dyslipidemia. She participates in exercise daily. An ACE inhibitor/angiotensin II receptor blocker is not being taken.    Past Medical History:  Diagnosis Date   Diet-controlled diabetes mellitus ([place])    type 2   Fibroids      Family History  Problem Relation Age of Onset   Diabetes Mother    Hypertension Mother    Hypertension Father    Hypertension Sister    Diabetes Sister    Health and safety inspector    Healthy Daughter    Healthy Brother    Healthy Brother    Healthy Daughter    Healthy Daughter      Current Outpatient Medications:    Accu-Chek FastClix Lancets MISC, Use as directed to check blood sugars 1 time per day dx: e11.65, Disp: 50 each, Rfl: 11   Cholecalciferol (VITAMIN D-3) 125 MCG  (5000 UT) TABS, Take 5,000 Units by mouth as needed., Disp: , Rfl:    dapagliflozin propanediol (FARXIGA) 10 MG TABS tablet, Take 1 tablet (10 mg total) by mouth daily before breakfast., Disp: 30 tablet, Rfl: 5   glucose blood (ACCU-CHEK GUIDE) test strip, Use as instructed to check blood sugars 1 time per day dx: e11.65, Disp: 50 each, Rfl: 11   Allergies  Allergen Reactions   Penicillins Anaphylaxis   Procardia [Nifedipine] Palpitations    The patient states she uses status post hysterectomy for birth control. Last LMP was Patient's last menstrual period was 10/23/2020.. Negative for Dysmenorrhea. Negative for: breast discharge, breast lump(s), breast pain and breast self exam. Associated symptoms include abnormal vaginal bleeding. Pertinent negatives include abnormal bleeding (hematology), anxiety, decreased libido, depression, difficulty falling sleep, dyspareunia, history of infertility, nocturia, sexual dysfunction, sleep disturbances, urinary incontinence, urinary urgency, vaginal discharge and vaginal itching. Diet regular.The patient states her exercise level is  minimal.  . The patient's tobacco use is:  Social History   Tobacco Use  Smoking Status Never  Smokeless Tobacco Never  . She has been exposed to passive smoke. The patient's alcohol use is:  Social History   Substance and Sexual Activity  Alcohol Use Never   Review of Systems  Constitutional: Negative.   HENT: Negative.    Eyes: Negative.   Respiratory: Negative.    Cardiovascular: Negative.   Gastrointestinal: Negative.  Endocrine: Negative.   Genitourinary: Negative.        She c/o vaginal dryness. She is s/p partial hysterectomy.   Musculoskeletal: Negative.   Skin: Negative.   Allergic/Immunologic: Negative.   Neurological: Negative.   Hematological: Negative.   Psychiatric/Behavioral: Negative.      Today's Vitals   10/15/21 0849  BP: 112/70  Temp: 98 F (36.7 C)  Weight: 131 lb 3.2 oz (59.5 kg)   Height: 5' 3.4" (1.61 m)  PainSc: 0-No pain   Body mass index is 22.95 kg/m.  Wt Readings from Last 3 Encounters:  10/15/21 131 lb 3.2 oz (59.5 kg)  05/28/21 134 lb 3.2 oz (60.9 kg)  03/13/21 136 lb 9.6 oz (62 kg)     Objective:  Physical Exam Vitals and nursing note reviewed.  Constitutional:      Appearance: Normal appearance.  HENT:     Head: Normocephalic and atraumatic.     Right Ear: Tympanic membrane, ear canal and external ear normal.     Left Ear: Tympanic membrane, ear canal and external ear normal.     Nose:     Comments: Masked     Mouth/Throat:     Comments: Masked  Eyes:     Extraocular Movements: Extraocular movements intact.     Conjunctiva/sclera: Conjunctivae normal.     Pupils: Pupils are equal, round, and reactive to light.  Cardiovascular:     Rate and Rhythm: Normal rate and regular rhythm.     Pulses:          Dorsalis pedis pulses are 3+ on the right side and 3+ on the left side.       Posterior tibial pulses are 2+ on the right side and 2+ on the left side.     Heart sounds: Normal heart sounds.  Pulmonary:     Effort: Pulmonary effort is normal.     Breath sounds: Normal breath sounds.  Chest:  Breasts:    Tanner Score is 5.     Right: Normal.     Left: Normal.  Abdominal:     General: Abdomen is flat. Bowel sounds are normal.     Palpations: Abdomen is soft.  Genitourinary:    Comments: deferred Musculoskeletal:        General: Normal range of motion.     Cervical back: Normal range of motion and neck supple.  Feet:     Right foot:     Protective Sensation: 5 sites tested.  5 sites sensed.     Skin integrity: Skin integrity normal.     Toenail Condition: Right toenails are normal.     Left foot:     Protective Sensation: 5 sites tested.  5 sites sensed.     Skin integrity: Skin integrity normal.     Toenail Condition: Left toenails are normal.  Skin:    General: Skin is warm and dry.  Neurological:     General: No focal  deficit present.     Mental Status: She is alert and oriented to person, place, and time.  Psychiatric:        Mood and Affect: Mood normal.        Behavior: Behavior normal.        Assessment And Plan:     1. Encounter for general adult medical examination w/o abnormal findings Comments: A full exam was performed. Importance of monthly self breast exam was discussed with the patient. PATIENT IS ADVISED TO GET 30-45 MINUTES REGULAR EXERCISE  NO LESS THAN FOUR TO FIVE DAYS PER WEEK - BOTH WEIGHTBEARING EXERCISES AND AEROBIC ARE RECOMMENDED.  PATIENT IS ADVISED TO FOLLOW A HEALTHY DIET WITH AT LEAST SIX FRUITS/VEGGIES PER DAY, DECREASE INTAKE OF RED MEAT, AND TO INCREASE FISH INTAKE TO TWO DAYS PER WEEK.  MEATS/FISH SHOULD NOT BE FRIED, BAKED OR BROILED IS PREFERABLE.  IT IS ALSO IMPORTANT TO CUT BACK ON YOUR SUGAR INTAKE. PLEASE AVOID ANYTHING WITH ADDED SUGAR, CORN SYRUP OR OTHER SWEETENERS. IF YOU MUST USE A SWEETENER, YOU CAN TRY STEVIA. IT IS ALSO IMPORTANT TO AVOID ARTIFICIALLY SWEETENERS AND DIET BEVERAGES. LASTLY, I SUGGEST WEARING SPF 50 SUNSCREEN ON EXPOSED PARTS AND ESPECIALLY WHEN IN THE DIRECT SUNLIGHT FOR AN EXTENDED PERIOD OF TIME.  PLEASE AVOID FAST FOOD RESTAURANTS AND INCREASE YOUR WATER INTAKE. - CBC - CMP14+EGFR - Lipid panel - Hemoglobin A1c  2. Uncontrolled type 2 diabetes mellitus with hyperglycemia ([place]) Comments: Diabetic foot exam was performed. She will f/u in 4 months for re-evaluation. She did inquire about GLP-1 agonist therapy. She denies family h/o thyroid cancer. Will consider in future if A1c warrants it. I DISCUSSED WITH THE PATIENT AT LENGTH REGARDING THE GOALS OF GLYCEMIC CONTROL AND POSSIBLE LONG-TERM COMPLICATIONS.  I  ALSO STRESSED THE IMPORTANCE OF COMPLIANCE WITH HOME GLUCOSE MONITORING, DIETARY RESTRICTIONS INCLUDING AVOIDANCE OF SUGARY DRINKS/PROCESSED FOODS,  ALONG WITH REGULAR EXERCISE.  I  ALSO STRESSED THE IMPORTANCE OF ANNUAL EYE EXAMS, SELF FOOT CARE  AND COMPLIANCE WITH OFFICE VISITS.  - POCT Urinalysis Dipstick (81002) - Microalbumin / Creatinine Urine Ratio - EKG 12-Lead - CBC - CMP14+EGFR  3. Vaginal dryness Comments: I will send her rx vaginal E suppositories to use vaginally. This product has worked well for her in the past. She will consult w/ Dr. Landry Mellow if persistent.   4. BMI 22.0-22.9, adult Comments: She is encouraged to incorporate more exercise into her daily routine, aiming for 150 minutes per week.   Patient was given opportunity to ask questions. Patient verbalized understanding of the plan and was able to repeat key elements of the plan. All questions were answered to their satisfaction.   I, Maximino Greenland, MD, have reviewed all documentation for this visit. The documentation on 10/15/21 for the exam, diagnosis, procedures, and orders are all accurate and complete.   THE PATIENT IS ENCOURAGED TO PRACTICE SOCIAL DISTANCING DUE TO THE COVID-19 PANDEMIC.

## 2021-10-16 LAB — LIPID PANEL
Chol/HDL Ratio: 3.7 ratio (ref 0.0–4.4)
Cholesterol, Total: 256 mg/dL — ABNORMAL HIGH (ref 100–199)
HDL: 70 mg/dL (ref >39)
LDL Chol Calc (NIH): 172 mg/dL — ABNORMAL HIGH (ref 0–99)
Triglycerides: 83 mg/dL (ref 0–149)
VLDL Cholesterol Cal: 14 mg/dL (ref 5–40)

## 2021-10-16 LAB — CMP14+EGFR
ALT: 10 IU/L (ref 0–32)
AST: 14 IU/L (ref 0–40)
Albumin/Globulin Ratio: 1.4 (ref 1.2–2.2)
Albumin: 4.5 g/dL (ref 3.8–4.8)
Alkaline Phosphatase: 93 IU/L (ref 44–121)
BUN/Creatinine Ratio: 14 (ref 9–23)
BUN: 12 mg/dL (ref 6–24)
Bilirubin Total: 0.3 mg/dL (ref 0.0–1.2)
CO2: 24 mmol/L (ref 20–29)
Calcium: 9.7 mg/dL (ref 8.7–10.2)
Chloride: 103 mmol/L (ref 96–106)
Creatinine, Ser: 0.86 mg/dL (ref 0.57–1.00)
Globulin, Total: 3.2 g/dL (ref 1.5–4.5)
Glucose: 111 mg/dL — ABNORMAL HIGH (ref 70–99)
Potassium: 4.7 mmol/L (ref 3.5–5.2)
Sodium: 141 mmol/L (ref 134–144)
Total Protein: 7.7 g/dL (ref 6.0–8.5)
eGFR: 85 mL/min/{1.73_m2} (ref >59)

## 2021-10-16 LAB — CBC
Hematocrit: 46.6 % (ref 34.0–46.6)
Hemoglobin: 15.4 g/dL (ref 11.1–15.9)
MCH: 29.1 pg (ref 26.6–33.0)
MCHC: 33 g/dL (ref 31.5–35.7)
MCV: 88 fL (ref 79–97)
Platelets: 158 10*3/uL (ref 150–450)
RBC: 5.29 x10E6/uL — ABNORMAL HIGH (ref 3.77–5.28)
RDW: 14.3 % (ref 11.7–15.4)
WBC: 3.8 10*3/uL (ref 3.4–10.8)

## 2021-10-16 LAB — MICROALBUMIN / CREATININE URINE RATIO
Creatinine, Urine: 104.1 mg/dL
Microalb/Creat Ratio: 7 mg/g creat (ref 0–29)
Microalbumin, Urine: 7 ug/mL

## 2021-10-16 LAB — HEMOGLOBIN A1C
Est. average glucose Bld gHb Est-mCnc: 151 mg/dL
Hgb A1c MFr Bld: 6.9 % — ABNORMAL HIGH (ref 4.8–5.6)

## 2021-10-28 ENCOUNTER — Encounter: Payer: Self-pay | Admitting: Internal Medicine

## 2021-10-28 ENCOUNTER — Other Ambulatory Visit: Payer: Self-pay

## 2021-10-28 MED ORDER — ATORVASTATIN CALCIUM 40 MG PO TABS: 40.0000 mg | ORAL_TABLET | Freq: Every day | ORAL | 1 refills | Status: DC

## 2021-12-10 DIAGNOSIS — Z01419 Encounter for gynecological examination (general) (routine) without abnormal findings: Secondary | ICD-10-CM | POA: Diagnosis not present

## 2021-12-12 ENCOUNTER — Other Ambulatory Visit: Payer: Self-pay | Admitting: Internal Medicine

## 2021-12-27 ENCOUNTER — Encounter: Payer: Self-pay | Admitting: Internal Medicine

## 2021-12-30 ENCOUNTER — Other Ambulatory Visit: Payer: Self-pay | Admitting: Internal Medicine

## 2021-12-30 MED ORDER — MEFLOQUINE HCL 250 MG PO TABS: ORAL_TABLET | ORAL | 0 refills | Status: DC

## 2021-12-30 MED ORDER — DAPAGLIFLOZIN PROPANEDIOL 10 MG PO TABS: 10.0000 mg | ORAL_TABLET | Freq: Every day | ORAL | 5 refills | Status: DC

## 2022-01-10 ENCOUNTER — Encounter: Payer: Self-pay | Admitting: Internal Medicine

## 2022-02-20 ENCOUNTER — Encounter: Payer: Self-pay | Admitting: Internal Medicine

## 2022-02-20 ENCOUNTER — Ambulatory Visit: Payer: 59 | Admitting: Nurse Practitioner

## 2022-02-20 ENCOUNTER — Encounter: Payer: Self-pay | Admitting: Nurse Practitioner

## 2022-02-20 VITALS — BP 135/87 | HR 73 | Temp 97.0°F | Ht 63.0 in | Wt 135.0 lb

## 2022-02-20 DIAGNOSIS — M5442 Lumbago with sciatica, left side: Secondary | ICD-10-CM

## 2022-02-20 DIAGNOSIS — M79604 Pain in right leg: Secondary | ICD-10-CM

## 2022-02-20 MED ORDER — KETOROLAC TROMETHAMINE 10 MG PO TABS: 10.0000 mg | ORAL_TABLET | Freq: Four times a day (QID) | ORAL | Status: AC | PRN

## 2022-02-20 NOTE — Progress Notes (Cosign Needed)
   Acute Office Visit  Subjective:     Patient ID: Virginia Mcdonald, female    DOB: 1976-05-28, 46 y.o.   MRN: [id number]  Chief Complaint  Patient presents with   Leg Pain    Pt. C/o right leg pain going from hip down into calf starting this morning.    Virginia Mcdonald is  46 y/o female who presents today for c/o pain from he right hip radiating down her leg to her last toes on the right foot. The pain began 6 to 12 hours ago at home. There was no reported injury. The pain is present in the right leg, right thigh, right knee and right toes. The quality of the pain is described as burning and shooting.The pain is at a severity of 9/10. The pain is severe. The pain has been Constant since onset. Associated symptoms include an inability to bear weight, numbness and tingling. She reports no foreign bodies present. The symptoms are aggravated by movement and weight bearing. She has tried non-weight bearing and NSAIDs for the symptoms. The treatment provided no relief.   Review of Systems  Constitutional: Negative.   Respiratory: Negative.    Cardiovascular: Negative.   Genitourinary: Negative.   Musculoskeletal:  Positive for back pain.  Neurological:  Positive for tingling.  Psychiatric/Behavioral: Negative.         Objective:    BP 135/87   Pulse 73   Temp (!) 97 F (36.1 C)   Ht '5\' 3"'$  (1.6 m)   Wt 135 lb (61.2 kg)   LMP 10/23/2020 Comment: S/P hysterectomy   SpO2 97%   BMI 23.91 kg/m    Physical Exam Constitutional:      Appearance: Normal appearance.  Cardiovascular:     Rate and Rhythm: Normal rate and regular rhythm.     Heart sounds: Normal heart sounds.  Pulmonary:     Effort: Pulmonary effort is normal.     Breath sounds: Normal breath sounds.  Neurological:     Mental Status: She is alert.  Psychiatric:        Mood and Affect: Mood normal.     No results found for any visits on 02/20/22.      Assessment & Plan:   Problem List Items Addressed  This Visit    Other Visit Diagnoses     Pain of right lower extremity    -  Primary   Relevant Orders   Referral for DG Lumbar Spine  Take ketorolac as brescribed Follow- up with PCP or this office as needed or if pain worsens  RICE method as needed OTC pain medication as written when needed.       Meds ordered this encounter  Medications   ketorolac (TORADOL) tablet 10 mg    Return if symptoms worsen or fail to improve.  Charleen Kirks, FNP

## 2022-02-21 ENCOUNTER — Ambulatory Visit (HOSPITAL_BASED_OUTPATIENT_CLINIC_OR_DEPARTMENT_OTHER)
Admission: RE | Admit: 2022-02-21 | Discharge: 2022-02-21 | Disposition: A | Discharge status: Home / Self Care | Payer: 59 | Source: Ambulatory Visit | Attending: Nurse Practitioner | Admitting: Nurse Practitioner

## 2022-02-21 DIAGNOSIS — M79604 Pain in right leg: Secondary | ICD-10-CM | POA: Insufficient documentation

## 2022-02-21 DIAGNOSIS — M545 Low back pain, unspecified: Secondary | ICD-10-CM | POA: Diagnosis not present

## 2022-02-24 ENCOUNTER — Ambulatory Visit (INDEPENDENT_AMBULATORY_CARE_PROVIDER_SITE_OTHER): Payer: 59 | Admitting: Nurse Practitioner

## 2022-02-24 ENCOUNTER — Encounter: Payer: Self-pay | Admitting: Nurse Practitioner

## 2022-02-24 VITALS — BP 118/62 | HR 82 | Temp 98.2°F | Ht 63.0 in | Wt 134.0 lb

## 2022-02-24 DIAGNOSIS — R232 Flushing: Secondary | ICD-10-CM

## 2022-02-24 DIAGNOSIS — M79605 Pain in left leg: Secondary | ICD-10-CM | POA: Diagnosis not present

## 2022-02-24 DIAGNOSIS — E1165 Type 2 diabetes mellitus with hyperglycemia: Secondary | ICD-10-CM | POA: Diagnosis not present

## 2022-02-24 DIAGNOSIS — E7800 Pure hypercholesterolemia, unspecified: Secondary | ICD-10-CM

## 2022-02-24 DIAGNOSIS — R208 Other disturbances of skin sensation: Secondary | ICD-10-CM

## 2022-02-24 DIAGNOSIS — M79604 Pain in right leg: Secondary | ICD-10-CM | POA: Diagnosis not present

## 2022-02-24 LAB — HEMOGLOBIN A1C
Est. average glucose Bld gHb Est-mCnc: 154 mg/dL
Hgb A1c MFr Bld: 7 % — ABNORMAL HIGH (ref 4.8–5.6)

## 2022-02-24 MED ORDER — DAPAGLIFLOZIN PROPANEDIOL 10 MG PO TABS: 10.0000 mg | ORAL_TABLET | Freq: Every day | ORAL | 1 refills | Status: DC

## 2022-02-24 NOTE — Progress Notes (Signed)
I,Tianna Badgett,acting as a Education administrator for [employer], FNP.,have documented all relevant documentation on the behalf of Minette Brine, FNP,as directed by  Minette Brine, FNP while in the presence of Minette Brine, [place].   Subjective:     Patient ID: Virginia Mcdonald , female    DOB: [birth year] , 46 y.o.   MRN: [id number]   Chief Complaint  Patient presents with   Diabetes    HPI  The patient is here today for a follow-up for diabetes.  She has recently returned from [place]. She was taking atorvastatin but was causing her leg pain. Feels like a pulling and sharp pain, she is also feeling the sensation to her arms. She went to urgent care on Tuesday had back xray was negative.  Her pain has become more severe since returning from [place]. She also feels a sharp burning pain.    Diabetes She presents for her follow-up diabetic visit. She has type 2 diabetes mellitus. Her disease course has been stable. Pertinent negatives for hypoglycemia include no dizziness or headaches. Pertinent negatives for diabetes include no chest pain, no fatigue and no polyuria. There are no hypoglycemic complications. There are no diabetic complications. Risk factors for coronary artery disease include diabetes mellitus. (Blood sugars in am 22 -131 range. )     Past Medical History:  Diagnosis Date   Diet-controlled diabetes mellitus ([place])    type 2   Fibroids      Family History  Problem Relation Age of Onset   Diabetes Mother    Hypertension Mother    Hypertension Father    Hypertension Sister    Diabetes Sister    Health and safety inspector    Healthy Daughter    Healthy Brother    Healthy Brother    Healthy Daughter    Healthy Daughter      Current Outpatient Medications:    Accu-Chek FastClix Lancets MISC, Use as directed to check blood sugars 1 time per day dx: e11.65, Disp: 50 each, Rfl: 11   Cholecalciferol (VITAMIN D-3) 125 MCG (5000 UT) TABS, Take 5,000 Units by mouth as needed., Disp: , Rfl:     glucose blood (ACCU-CHEK GUIDE) test strip, Use as instructed to check blood sugars 1 time per day dx: e11.65, Disp: 50 each, Rfl: 11   dapagliflozin propanediol (FARXIGA) 10 MG TABS tablet, Take 1 tablet (10 mg total) by mouth daily before breakfast., Disp: 90 tablet, Rfl: 1  Current Facility-Administered Medications:    ketorolac (TORADOL) tablet 10 mg, 10 mg, Oral, Q6H PRN, Ofori, Caprice Red, FNP   Allergies  Allergen Reactions   Penicillins Anaphylaxis   Procardia [Nifedipine] Palpitations     Review of Systems  Constitutional: Negative.  Negative for fatigue.  Respiratory: Negative.    Cardiovascular: Negative.  Negative for chest pain and leg swelling.  Gastrointestinal: Negative.   Endocrine: Negative for polyuria.  Musculoskeletal:  Positive for myalgias.  Neurological: Negative.  Negative for dizziness and headaches.  Psychiatric/Behavioral: Negative.       Today's Vitals   02/24/22 1103  BP: 118/62  Pulse: 82  Temp: 98.2 F (36.8 C)  TempSrc: Oral  Weight: 134 lb (60.8 kg)  Height: $Remove'5\' 3"'VIKpHtk$  (1.6 m)   Body mass index is 23.74 kg/m.   Objective:  Physical Exam Vitals reviewed.  Constitutional:      General: She is not in acute distress.    Appearance: Normal appearance.  Cardiovascular:     Rate and Rhythm: Normal rate and regular rhythm.  Pulses: Normal pulses.     Heart sounds: Normal heart sounds. No murmur heard. Pulmonary:     Effort: Pulmonary effort is normal. No respiratory distress.     Breath sounds: No wheezing.  Skin:    General: Skin is warm and dry.     Capillary Refill: Capillary refill takes less than 2 seconds.  Neurological:     General: No focal deficit present.     Mental Status: She is alert and oriented to person, place, and time.     Cranial Nerves: No cranial nerve deficit.     Motor: No weakness.  Psychiatric:        Mood and Affect: Mood normal.        Behavior: Behavior normal.        Thought Content: Thought content normal.         Judgment: Judgment normal.         Assessment And Plan:     1. Uncontrolled type 2 diabetes mellitus with hyperglycemia ([place]) Comments: HgbA1c was slightly elevated at last visit, she is tolerating [place] well. Rx sent for 90 day supply. Diabetic foot exam done with decreased sensation  - Hemoglobin A1c - BMP8+EGFR - dapagliflozin propanediol (FARXIGA) 10 MG TABS tablet; Take 1 tablet (10 mg total) by mouth daily before breakfast.  Dispense: 90 tablet; Refill: 1  2. Pure hypercholesterolemia Comments: She is no longer taking statin due to myalgias. Will check her lipids and consider medication change or add CoQ10.  - Lipid panel  3. Pain in both lower extremities Comments: Neuropathy vs muscle cramping vs claudication, will send for venous ultrasound with ABI  - VAS [place] LOWER EXTREMITY VENOUS REFLUX; Future - TSH - Vitamin B12 - VITAMIN D 25 Hydroxy (Vit-D Deficiency, Fractures)  4. Burning sensation Comments: May be related to neuropathy vs vitamin B12 deficiency.  - VAS [place] LOWER EXTREMITY VENOUS REFLUX; Future - TSH - Vitamin B12  5. Hot flashes Comments: Encouraged to take [facility] and to layer her clothing, avoid eating high sugar and carb foods. She can consult with her GYN as well     Patient was given opportunity to ask questions. Patient verbalized understanding of the plan and was able to repeat key elements of the plan. All questions were answered to their satisfaction.  Minette Brine, FNP   I, Minette Brine, FNP, have reviewed all documentation for this visit. The documentation on 02/24/22 for the exam, diagnosis, procedures, and orders are all accurate and complete.   IF YOU HAVE BEEN REFERRED TO A SPECIALIST, IT MAY TAKE 1-2 WEEKS TO SCHEDULE/PROCESS THE REFERRAL. IF YOU HAVE NOT HEARD FROM US/SPECIALIST IN TWO WEEKS, PLEASE GIVE [place] A CALL AT [phone].   THE PATIENT IS ENCOURAGED TO PRACTICE SOCIAL DISTANCING DUE TO THE COVID-19 PANDEMIC.

## 2022-02-24 NOTE — Patient Instructions (Signed)

## 2022-02-25 ENCOUNTER — Ambulatory Visit: Payer: 59 | Admitting: Internal Medicine

## 2022-02-25 LAB — VITAMIN D 25 HYDROXY (VIT D DEFICIENCY, FRACTURES): Vit D, 25-Hydroxy: 66.5 ng/mL (ref 30.0–100.0)

## 2022-02-25 LAB — LIPID PANEL
Chol/HDL Ratio: 3.8 ratio (ref 0.0–4.4)
Cholesterol, Total: 254 mg/dL — ABNORMAL HIGH (ref 100–199)
HDL: 66 mg/dL (ref >39)
LDL Chol Calc (NIH): 178 mg/dL — ABNORMAL HIGH (ref 0–99)
Triglycerides: 60 mg/dL (ref 0–149)
VLDL Cholesterol Cal: 10 mg/dL (ref 5–40)

## 2022-02-25 LAB — BMP8+EGFR
BUN/Creatinine Ratio: 16 (ref 9–23)
BUN: 14 mg/dL (ref 6–24)
CO2: 23 mmol/L (ref 20–29)
Calcium: 10.1 mg/dL (ref 8.7–10.2)
Chloride: 98 mmol/L (ref 96–106)
Creatinine, Ser: 0.85 mg/dL (ref 0.57–1.00)
Glucose: 116 mg/dL — ABNORMAL HIGH (ref 70–99)
Potassium: 4.3 mmol/L (ref 3.5–5.2)
Sodium: 138 mmol/L (ref 134–144)
eGFR: 87 mL/min/{1.73_m2} (ref >59)

## 2022-02-25 LAB — TSH: TSH: 1.49 u[IU]/mL (ref 0.450–4.500)

## 2022-02-25 LAB — VITAMIN B12: Vitamin B-12: 1086 pg/mL (ref 232–1245)

## 2022-02-27 ENCOUNTER — Encounter: Payer: Self-pay | Admitting: Internal Medicine

## 2022-02-27 NOTE — Addendum Note (Signed)
Addended by: Minette Brine F on: 02/27/2022 02:21 PM   Modules accepted: Orders

## 2022-03-10 ENCOUNTER — Other Ambulatory Visit: Payer: Self-pay

## 2022-03-10 DIAGNOSIS — M79604 Pain in right leg: Secondary | ICD-10-CM

## 2022-03-17 ENCOUNTER — Ambulatory Visit (HOSPITAL_COMMUNITY)
Admission: RE | Admit: 2022-03-17 | Discharge: 2022-03-17 | Disposition: A | Discharge status: Home / Self Care | Payer: 59 | Source: Ambulatory Visit | Attending: Nurse Practitioner | Admitting: Nurse Practitioner

## 2022-03-17 DIAGNOSIS — M79605 Pain in left leg: Secondary | ICD-10-CM | POA: Insufficient documentation

## 2022-03-17 DIAGNOSIS — M79604 Pain in right leg: Secondary | ICD-10-CM | POA: Insufficient documentation

## 2022-03-17 NOTE — Progress Notes (Signed)
VASCULAR LAB    Bilateral lower extremity has been performed.  See CV proc for preliminary results.  Messaged negative results to Minette Brine, FNP via secure chat  Sharion Dove, RVT 03/17/2022, 10:32 AM

## 2022-03-24 ENCOUNTER — Encounter: Payer: Self-pay | Admitting: Internal Medicine

## 2022-03-24 ENCOUNTER — Ambulatory Visit (HOSPITAL_COMMUNITY)
Admission: RE | Admit: 2022-03-24 | Payer: 59 | Source: Ambulatory Visit | Attending: Nurse Practitioner | Admitting: Nurse Practitioner

## 2022-03-27 ENCOUNTER — Encounter: Payer: Self-pay | Admitting: Internal Medicine

## 2022-03-27 DIAGNOSIS — M545 Low back pain, unspecified: Secondary | ICD-10-CM | POA: Insufficient documentation

## 2022-03-28 ENCOUNTER — Other Ambulatory Visit: Payer: Self-pay | Admitting: Nurse Practitioner

## 2022-03-28 ENCOUNTER — Ambulatory Visit (HOSPITAL_COMMUNITY)
Admission: RE | Admit: 2022-03-28 | Discharge: 2022-03-28 | Disposition: A | Discharge status: Home / Self Care | Payer: 59 | Source: Ambulatory Visit | Attending: Cardiovascular Disease | Admitting: Cardiovascular Disease

## 2022-03-28 ENCOUNTER — Other Ambulatory Visit: Payer: Self-pay

## 2022-03-28 DIAGNOSIS — M79661 Pain in right lower leg: Secondary | ICD-10-CM

## 2022-03-28 DIAGNOSIS — M79604 Pain in right leg: Secondary | ICD-10-CM | POA: Diagnosis not present

## 2022-03-28 DIAGNOSIS — E119 Type 2 diabetes mellitus without complications: Secondary | ICD-10-CM

## 2022-03-28 DIAGNOSIS — M79605 Pain in left leg: Secondary | ICD-10-CM | POA: Insufficient documentation

## 2022-03-28 DIAGNOSIS — M79662 Pain in left lower leg: Secondary | ICD-10-CM

## 2022-03-28 MED ORDER — FREESTYLE LIBRE 3 SENSOR MISC: 3 refills | Status: DC

## 2022-03-31 ENCOUNTER — Other Ambulatory Visit: Payer: Self-pay | Admitting: Nurse Practitioner

## 2022-03-31 ENCOUNTER — Other Ambulatory Visit: Payer: Self-pay

## 2022-03-31 DIAGNOSIS — M79604 Pain in right leg: Secondary | ICD-10-CM

## 2022-04-14 ENCOUNTER — Ambulatory Visit (INDEPENDENT_AMBULATORY_CARE_PROVIDER_SITE_OTHER): Payer: 59 | Admitting: Orthopedic Surgery

## 2022-04-14 ENCOUNTER — Encounter: Payer: Self-pay | Admitting: Orthopedic Surgery

## 2022-04-14 DIAGNOSIS — M25562 Pain in left knee: Secondary | ICD-10-CM

## 2022-04-14 DIAGNOSIS — G8929 Other chronic pain: Secondary | ICD-10-CM

## 2022-04-14 DIAGNOSIS — M25561 Pain in right knee: Secondary | ICD-10-CM | POA: Diagnosis not present

## 2022-04-14 NOTE — Progress Notes (Signed)
Office Visit Note   Patient: Virginia Mcdonald           Date of Birth: 12/04/1975           MRN: [id number] Visit Date: 04/14/2022              Requested by: Minette Brine, [address] [place],  [place] [address] PCP: Glendale Chard, MD  Chief Complaint  Patient presents with   Right Leg - Pain   Left Leg - Pain      HPI: Patient is a 46 year old woman who is seen for bilateral knee pain.  Patient complains of grinding with ambulation.  Patient has been recently diagnosed with diabetes her A1c is 7.0.  Patient was on statins and this was stopped secondary to myalgia.  Ankle-brachial indices obtained in July were normal.  Radiographs of her lumbar spine were normal.  Assessment & Plan: Visit Diagnoses:  1. Chronic pain of both knees     Plan: Recommended strengthening to improve patella tracking of both lower extremities.  Continue with treadmill for aerobic conditioning.  Follow-Up Instructions: Return if symptoms worsen or fail to improve.   Ortho Exam  Patient is alert, oriented, no adenopathy, well-dressed, normal affect, normal respiratory effort. Examination patient has crepitation with range of motion in the patellofemoral joint of both knees.  There is no effusion.  She has a negative straight leg raise bilaterally the calf is soft nontender no pain with dorsiflexion no signs of DVT negative sciatic tension sign.  Patella tracks midline.  Imaging: No results found. No images are attached to the encounter.  Labs: Lab Results  Component Value Date   HGBA1C 7.0 (H) 02/24/2022   HGBA1C 6.9 (H) 10/15/2021   HGBA1C 6.8 (H) 05/28/2021     Lab Results  Component Value Date   ALBUMIN 4.5 10/15/2021   ALBUMIN 4.3 07/05/2020   ALBUMIN 4.1 09/08/2019    No results found for: "MG" Lab Results  Component Value Date   VD25OH 66.5 02/24/2022   VD25OH 62.3 07/05/2020    No results found for: "PREALBUMIN"    Latest Ref Rng & Units 10/15/2021     9:44 AM 10/24/2020    2:13 AM 10/19/2020    8:58 AM  CBC EXTENDED  WBC 3.4 - 10.8 x10E3/uL 3.8  7.5  4.3   RBC 3.77 - 5.28 x10E6/uL 5.29  3.88  4.23   Hemoglobin 11.1 - 15.9 g/dL 15.4  10.2  11.1   HCT 34.0 - 46.6 % 46.6  31.5  35.2   Platelets 150 - 450 x10E3/uL 158  162  185      There is no height or weight on file to calculate BMI.  Orders:  No orders of the defined types were placed in this encounter.  No orders of the defined types were placed in this encounter.    Procedures: No procedures performed  Clinical Data: No additional findings.  ROS:  All other systems negative, except as noted in the HPI. Review of Systems  Objective: Vital Signs: LMP 10/23/2020 Comment: S/P hysterectomy   Specialty Comments:  No specialty comments available.  PMFS History: Patient Active Problem List   Diagnosis Date Noted   Uncontrolled type 2 diabetes mellitus with hyperglycemia ([place]) 10/15/2021   Pure hypercholesterolemia 10/15/2021   Vaginal dryness 10/15/2021   BMI 22.0-22.9, adult 10/15/2021   Fibroids 10/23/2020   S/P hysterectomy 10/23/2020   Newly diagnosed diabetes ([place]) 07/06/2018   Past Medical History:  Diagnosis Date   Diet-controlled diabetes mellitus ([place])    type 2   Fibroids     Family History  Problem Relation Age of Onset   Diabetes Mother    Hypertension Mother    Hypertension Father    Hypertension Sister    Diabetes Sister    Healthy Brother    Healthy Daughter    Healthy Brother    Healthy Brother    Healthy Daughter    Healthy Daughter     Past Surgical History:  Procedure Laterality Date   CESAREAN SECTION  2003   ROBOTIC ASSISTED LAPAROSCOPIC HYSTERECTOMY AND SALPINGECTOMY Bilateral 10/23/2020   Procedure: XI ROBOTIC ASSISTED LAPAROSCOPIC HYSTERECTOMY AND BILATERAL SALPINGECTOMY;  Surgeon: Christophe Louis, MD;  Location: [facility];  Service: Gynecology;  Laterality: Bilateral;   Social History   Occupational History    Not on file  Tobacco Use   Smoking status: Never   Smokeless tobacco: Never  Vaping Use   Vaping Use: Never used  Substance and Sexual Activity   Alcohol use: Never   Drug use: Never   Sexual activity: Yes

## 2022-05-15 ENCOUNTER — Encounter: Payer: Self-pay | Admitting: Internal Medicine

## 2022-05-15 ENCOUNTER — Telehealth (INDEPENDENT_AMBULATORY_CARE_PROVIDER_SITE_OTHER): Payer: 59 | Admitting: Internal Medicine

## 2022-05-15 VITALS — Ht 63.0 in | Wt 130.0 lb

## 2022-05-15 DIAGNOSIS — Z7184 Encounter for health counseling related to travel: Secondary | ICD-10-CM | POA: Diagnosis not present

## 2022-05-15 DIAGNOSIS — R252 Cramp and spasm: Secondary | ICD-10-CM | POA: Diagnosis not present

## 2022-05-15 DIAGNOSIS — E1165 Type 2 diabetes mellitus with hyperglycemia: Secondary | ICD-10-CM | POA: Diagnosis not present

## 2022-05-15 DIAGNOSIS — Z298 Encounter for other specified prophylactic measures: Secondary | ICD-10-CM

## 2022-05-15 MED ORDER — DAPAGLIFLOZIN PROPANEDIOL 10 MG PO TABS: 10.0000 mg | ORAL_TABLET | Freq: Every day | ORAL | 1 refills | Status: DC

## 2022-05-15 MED ORDER — MEFLOQUINE HCL 250 MG PO TABS: ORAL_TABLET | ORAL | 0 refills | Status: DC

## 2022-05-15 NOTE — Progress Notes (Signed)
Virtual Visit via Video   This visit type was conducted due to national recommendations for restrictions regarding the COVID-19 Pandemic (e.g. social distancing) in an effort to limit this patient's exposure and mitigate transmission in our community.  Due to her co-morbid illnesses, this patient is at least at moderate risk for complications without adequate follow up.  This format is felt to be most appropriate for this patient at this time.  All issues noted in this document were discussed and addressed.  A limited physical exam was performed with this format.    This visit type was conducted due to national recommendations for restrictions regarding the COVID-19 Pandemic (e.g. social distancing) in an effort to limit this patient's exposure and mitigate transmission in our community.  Patients identity confirmed using two different identifiers.  This format is felt to be most appropriate for this patient at this time.  All issues noted in this document were discussed and addressed.  No physical exam was performed (except for noted visual exam findings with Video Visits).    Date:  05/31/2022   ID:  Spruha, Weight 06-13-1976, MRN [id number]  Patient Location:  Home  Provider location:   Office    Chief Complaint:  "I need a letter for my flight"  History of Present Illness:    Virginia Mcdonald is a 46 y.o. female who presents via video conferencing for a telehealth visit today.    The patient does not have symptoms concerning for COVID-19 infection (fever, chills, cough, or new shortness of breath).   She presents today for virtual visit. She prefers this method of contact due to COVID-19 pandemic.  She presents today to discuss her travel plans. She is traveling to a [place] area of [place] on [place] 17th. She wants to know if malaria meds are needed. Additionally, she states airline is requesting she have a letter documenting why she needs seating with extra  legroom. She reports getting severe leg cramps when she travels internationally.        Past Medical History:  Diagnosis Date   Diet-controlled diabetes mellitus ([place])    type 2   Fibroids    Past Surgical History:  Procedure Laterality Date   CESAREAN SECTION  2003   ROBOTIC ASSISTED LAPAROSCOPIC HYSTERECTOMY AND SALPINGECTOMY Bilateral 10/23/2020   Procedure: XI ROBOTIC ASSISTED LAPAROSCOPIC HYSTERECTOMY AND BILATERAL SALPINGECTOMY;  Surgeon: Christophe Louis, MD;  Location: [facility];  Service: Gynecology;  Laterality: Bilateral;     Current Meds  Medication Sig   Accu-Chek FastClix Lancets MISC Use as directed to check blood sugars 1 time per day dx: e11.65   Cholecalciferol (VITAMIN D-3) 125 MCG (5000 UT) TABS Take 5,000 Units by mouth as needed.   Continuous Blood Gluc Sensor (FREESTYLE LIBRE 3 SENSOR) MISC Use to check blood sugar 3 times a day. Dx code e11.65   glucose blood (ACCU-CHEK GUIDE) test strip Use as instructed to check blood sugars 1 time per day dx: e11.65   mefloquine (LARIAM) 250 MG tablet One tab po once weekly, starting one week prior to your trip, throughout trip and four weeks once you return   [DISCONTINUED] dapagliflozin propanediol (FARXIGA) 10 MG TABS tablet Take 1 tablet (10 mg total) by mouth daily before breakfast.     Allergies:   Penicillins and Procardia [nifedipine]   Social History   Tobacco Use   Smoking status: Never   Smokeless tobacco: Never  Vaping Use   Vaping Use: Never  used  Substance Use Topics   Alcohol use: Never   Drug use: Never     Family Hx: The patient's family history includes Diabetes in her mother and sister; Healthy in her brother, brother, brother, daughter, daughter, and daughter; Hypertension in her father, mother, and sister.  ROS:   Please see the history of present illness.    Review of Systems  Constitutional: Negative.   Respiratory: Negative.    Cardiovascular: Negative.   Gastrointestinal:  Negative.   Musculoskeletal:  Positive for myalgias.  Neurological: Negative.   Psychiatric/Behavioral: Negative.      All other systems reviewed and are negative.   Labs/Other Tests and Data Reviewed:    Recent Labs: 10/15/2021: ALT 10; Hemoglobin 15.4; Platelets 158 02/24/2022: BUN 14; Creatinine, Ser 0.85; Potassium 4.3; Sodium 138; TSH 1.490   Recent Lipid Panel Lab Results  Component Value Date/Time   CHOL 254 (H) 02/24/2022 02:00 PM   TRIG 60 02/24/2022 02:00 PM   HDL 66 02/24/2022 02:00 PM   CHOLHDL 3.8 02/24/2022 02:00 PM   LDLCALC 178 (H) 02/24/2022 02:00 PM    Wt Readings from Last 3 Encounters:  05/19/22 130 lb (59 kg)  05/15/22 130 lb (59 kg)  02/24/22 134 lb (60.8 kg)     Exam:    Vital Signs:  Ht '5\' 3"'$  (1.6 m)   Wt 130 lb (59 kg)   LMP 10/23/2020 Comment: S/P hysterectomy   BMI 23.03 kg/m     Physical Exam Vitals and nursing note reviewed.  Constitutional:      Appearance: Normal appearance.  HENT:     Head: Normocephalic and atraumatic.  Pulmonary:     Effort: Pulmonary effort is normal.  Musculoskeletal:     Cervical back: Normal range of motion.  Neurological:     Mental Status: She is alert and oriented to person, place, and time.  Psychiatric:        Mood and Affect: Affect normal.    ASSESSMENT & PLAN:    1. Leg cramps Comments: She is encouraged to stay well hydrated prior to and during her flight(s). Also,   2. Uncontrolled type 2 diabetes mellitus with hyperglycemia ([place]) Comments: She is tolerating [place] well. Rx sent for 90 day supply. She will rto in October 2023 for her next diabetes exam.  - dapagliflozin propanediol (FARXIGA) 10 MG TABS tablet; Take 1 tablet (10 mg total) by mouth daily before breakfast.  Dispense: 90 tablet; Refill: 1  3. Encounter for counseling for travel Comments: She will contact [facility] to determine if any vaccines need to be updated for travel to [place]. I will also check [place]  Registry.   4. Need for malaria prophylaxis Comments: I will send rx mefloquine '250mg'$  weekly, starting 1 week prior to travel, throughout her trip, and then for another 4 weeks once she returns.      COVID-19 Education: The signs and symptoms of COVID-19 were discussed with the patient and how to seek care for testing (follow up with PCP or arrange E-visit).  The importance of social distancing was discussed today.  Patient Risk:   After full review of this patients clinical status, I feel that they are at least moderate risk at this time.  Time:   Today, I have spent 12 minutes with the patient with telehealth technology discussing above diagnoses.     Medication Adjustments/Labs and Tests Ordered: Current medicines are reviewed at length with the patient today.  Concerns regarding medicines are  outlined above.   Tests Ordered: No orders of the defined types were placed in this encounter.   Medication Changes: Meds ordered this encounter  Medications   dapagliflozin propanediol (FARXIGA) 10 MG TABS tablet    Sig: Take 1 tablet (10 mg total) by mouth daily before breakfast.    Dispense:  90 tablet    Refill:  1   mefloquine (LARIAM) 250 MG tablet    Sig: One tab po once weekly, starting one week prior to your trip, throughout trip and four weeks once you return    Dispense:  8 tablet    Refill:  0    Disposition:  Follow up prn  Signed, Maximino Green[place], MD

## 2022-05-15 NOTE — Patient Instructions (Signed)
Travel Vaccine Information Vaccines (immunizations) can protect you from certain diseases. If you plan to travel to another country, see your health care provider or a travel medicine specialist to discuss: Where you are going. Include all countries in your travel schedule. How long you are staying. What you will be doing. Based on this information, your health care provider may recommend: Routine vaccines. These vaccines are standard for all children and adults. Travel vaccines: For most travelers. These vaccines are recommended for most travelers before foreign travel. For some travelers. These vaccines may be necessary based on the destination country or region. It is important to see your health care provider at least 4-6 weeks before you travel, and bring your vaccine records. This allows time for recommended vaccines to take effect. It also provides enough time for you to get vaccines that must be given in a series over a period of days or weeks. If you have fewer than 4 weeks before you leave, you should still see your health care provider. You might still benefit from vaccines or medicines. What are routine vaccines? Routine vaccines are shots that can protect you from common diseases in many parts of the world. Most routine vaccines are given at certain ages starting in childhood. It is important that you are up to date on your routine vaccines before you travel. Routine vaccines include: An annual flu (influenza) vaccine. The annual influenza vaccine sometimes differs for the northern and southern hemispheres. You should: Get both vaccines if you are traveling to the other hemisphere and you have a chronic medical condition. Get the vaccine shortly before or during the flu season, and only if the vaccine in your country differs from the vaccine in your destination country. Get the other influenza vaccine either before leaving the country or shortly after arriving at the destination  country. Age-related vaccines. Infants 6-11 months old should receive a measles, mumps, and rubella (MMR) vaccine before traveling to another country. Children and adults should be up to date with all recommended vaccines. Adults 60 years or older should talk to their health care provider about getting a vaccine against a certain type of pneumonia (pneumococcal) and a vaccine against shingles (herpes zoster). Extra doses of certain vaccines (booster vaccines), such as Tdap (tetanus, diphtheria, and pertussis). What are recommended vaccines? Recommended travel vaccines change over time. Your health care provider can tell you what vaccines are recommended before your trip. Recommended vaccines will depend on: The country or countries of travel. Whether you will be traveling to rural areas. How long you will be traveling. The season of the year. Your health status. Your vaccine history. For most travelers The following vaccines are recommended for most international travelers, depending on the country or countries you are traveling to: Hepatitis A. Typhoid. For some travelers Additional vaccines may be required when traveling to certain countries, due to a disease being common in a particular area or an ongoing outbreak of a disease. The following vaccines may be recommended based on where you are traveling: Yellow fever vaccine. This is required before traveling to certain countries in [place] and [place]. Get the yellow fever vaccine at an approved center at least 10 days before your trip. You will receive a stamp, certificate, or other proof of yellow fever immunization. If proof of immunization is incomplete or inaccurate, you could be medically isolated (quarantined), denied entry, or given another dose of vaccine at the travel site. If it has been longer than 10 years since you   received the yellow fever vaccine, another dose is required. Meningococcal vaccine. This may be required  prior to travel to parts of [place] and [place]. Get this vaccine at least 10 days before your trip. After 10 days, most people show immunity to meningococcal disease. Proof of meningococcal immunization is required by the [place] for any person taking part in a Muslim pilgrimage. You may not receive a visa if you are not able to provide proof of immunization. If it has been longer than 3 years since your last immunization, another dose may be required. Polio vaccine. If you travel to a country where there is a higher risk of getting polio, you may need a booster dose. Polio is a routine vaccine that most people receive as a child. Even if you completed the vaccine series as a child, you may need a booster dose before traveling to high-risk countries. Infants and children may need to follow an accelerated schedule to complete the polio vaccine series before traveling to high-risk countries. Some countries may require you to show proof that you have been vaccinated. Depending on your travel plans, you may need additional vaccines, such as: Hepatitis B. Rabies. Tick-borne encephalitis. Cholera. Where to find more information Centers for Disease Control and Prevention (CDC): http://www.wolf.info/ World Health Organization [facility]): RoleLink.com.br U.S. Department of Health and Human Services: www.vaccines.gov Summary Vaccines (immunizations) can protect you from certain diseases. See your health care provider at least 4-6 weeks before you travel, and bring your vaccine records. This allows time for vaccines to take effect. Vaccines for travelers include routine vaccines, recommended travel vaccines, and geographically required travel vaccines. The most commonly recommended travel vaccines are the hepatitis A and typhoid vaccines. This information is not intended to replace advice given to you by your health care provider. Make sure you discuss any questions you have with your health  care provider. Document Revised: 10/15/2020 Document Reviewed: 10/15/2020 Elsevier Patient Education  [place].

## 2022-05-19 ENCOUNTER — Ambulatory Visit (INDEPENDENT_AMBULATORY_CARE_PROVIDER_SITE_OTHER): Payer: 59

## 2022-05-19 VITALS — BP 116/80 | HR 74 | Temp 98.0°F | Ht 63.0 in | Wt 130.0 lb

## 2022-05-19 DIAGNOSIS — Z23 Encounter for immunization: Secondary | ICD-10-CM

## 2022-05-19 NOTE — Progress Notes (Signed)
Patient presents today for a flu shot.  

## 2022-06-30 ENCOUNTER — Encounter: Payer: Self-pay | Admitting: Internal Medicine

## 2022-06-30 ENCOUNTER — Ambulatory Visit (INDEPENDENT_AMBULATORY_CARE_PROVIDER_SITE_OTHER): Payer: 59 | Admitting: Internal Medicine

## 2022-06-30 VITALS — BP 112/70 | HR 75 | Temp 98.4°F | Ht 63.0 in | Wt 134.0 lb

## 2022-06-30 DIAGNOSIS — Z6823 Body mass index (BMI) 23.0-23.9, adult: Secondary | ICD-10-CM | POA: Diagnosis not present

## 2022-06-30 DIAGNOSIS — E7800 Pure hypercholesterolemia, unspecified: Secondary | ICD-10-CM

## 2022-06-30 DIAGNOSIS — M549 Dorsalgia, unspecified: Secondary | ICD-10-CM | POA: Diagnosis not present

## 2022-06-30 DIAGNOSIS — E1165 Type 2 diabetes mellitus with hyperglycemia: Secondary | ICD-10-CM | POA: Diagnosis not present

## 2022-06-30 NOTE — Patient Instructions (Addendum)
The 10-year ASCVD risk score (Arnett DK, et al., 2019) is: 1.2%   Values used to calculate the score:     Age: 46 years     Sex: Female     Is Non-Hispanic African American: Yes     Diabetic: Yes     Tobacco smoker: No     Systolic Blood Pressure: 161 mmHg     Is BP treated: No     HDL Cholesterol: 66 mg/dL     Total Cholesterol: 254 mg/dL   Diabetes Mellitus and Nutrition, Adult When you have diabetes, or diabetes mellitus, it is very important to have healthy eating habits because your blood sugar (glucose) levels are greatly affected by what you eat and drink. Eating healthy foods in the right amounts, at about the same times every day, can help you: Manage your blood glucose. Lower your risk of heart disease. Improve your blood pressure. Reach or maintain a healthy weight. What can affect my meal plan? Every person with diabetes is different, and each person has different needs for a meal plan. Your health care provider may recommend that you work with a dietitian to make a meal plan that is best for you. Your meal plan may vary depending on factors such as: The calories you need. The medicines you take. Your weight. Your blood glucose, blood pressure, and cholesterol levels. Your activity level. Other health conditions you have, such as heart or kidney disease. How do carbohydrates affect me? Carbohydrates, also called carbs, affect your blood glucose level more than any other type of food. Eating carbs raises the amount of glucose in your blood. It is important to know how many carbs you can safely have in each meal. This is different for every person. Your dietitian can help you calculate how many carbs you should have at each meal and for each snack. How does alcohol affect me? Alcohol can cause a decrease in blood glucose (hypoglycemia), especially if you use insulin or take certain diabetes medicines by mouth. Hypoglycemia can be a life-threatening condition. Symptoms of  hypoglycemia, such as sleepiness, dizziness, and confusion, are similar to symptoms of having too much alcohol. Do not drink alcohol if: Your health care provider tells you not to drink. You are pregnant, may be pregnant, or are planning to become pregnant. If you drink alcohol: Limit how much you have to: 0-1 drink a day for women. 0-2 drinks a day for men. Know how much alcohol is in your drink. In the [place] one drink equals one 12 oz bottle of beer (355 mL), one 5 oz glass of wine (148 mL), or one 1 oz glass of hard liquor (44 mL). Keep yourself hydrated with water, diet soda, or unsweetened iced tea. Keep in mind that regular soda, juice, and other mixers may contain a lot of sugar and must be counted as carbs. What are tips for following this plan?  Reading food labels Start by checking the serving size on the Nutrition Facts label of packaged foods and drinks. The number of calories and the amount of carbs, fats, and other nutrients listed on the label are based on one serving of the item. Many items contain more than one serving per package. Check the total grams (g) of carbs in one serving. Check the number of grams of saturated fats and trans fats in one serving. Choose foods that have a low amount or none of these fats. Check the number of milligrams (mg) of salt (sodium) in one  serving. Most people should limit total sodium intake to less than 2,300 mg per day. Always check the nutrition information of foods labeled as "low-fat" or "nonfat." These foods may be higher in added sugar or refined carbs and should be avoided. Talk to your dietitian to identify your daily goals for nutrients listed on the label. Shopping Avoid buying canned, pre-made, or processed foods. These foods tend to be high in fat, sodium, and added sugar. Shop around the outside edge of the grocery store. This is where you will most often find fresh fruits and vegetables, bulk grains, fresh meats, and fresh dairy  products. Cooking Use low-heat cooking methods, such as baking, instead of high-heat cooking methods, such as deep frying. Cook using healthy oils, such as olive, canola, or sunflower oil. Avoid cooking with butter, cream, or high-fat meats. Meal planning Eat meals and snacks regularly, preferably at the same times every day. Avoid going long periods of time without eating. Eat foods that are high in fiber, such as fresh fruits, vegetables, beans, and whole grains. Eat 4-6 oz (112-168 g) of lean protein each day, such as lean meat, chicken, fish, eggs, or tofu. One ounce (oz) (28 g) of lean protein is equal to: 1 oz (28 g) of meat, chicken, or fish. 1 egg.  cup (62 g) of tofu. Eat some foods each day that contain healthy fats, such as avocado, nuts, seeds, and fish. What foods should I eat? Fruits Berries. Apples. Oranges. Peaches. Apricots. Plums. Grapes. Mangoes. Papayas. Pomegranates. Kiwi. Cherries. Vegetables Leafy greens, including lettuce, spinach, kale, chard, collard greens, mustard greens, and cabbage. Beets. Cauliflower. Broccoli. Carrots. Green beans. Tomatoes. Peppers. Onions. Cucumbers. Brussels sprouts. Grains Whole grains, such as whole-wheat or whole-grain bread, crackers, tortillas, cereal, and pasta. Unsweetened oatmeal. Quinoa. Brown or wild rice. Meats and other proteins Seafood. Poultry without skin. Lean cuts of poultry and beef. Tofu. Nuts. Seeds. Dairy Low-fat or fat-free dairy products such as milk, yogurt, and cheese. The items listed above may not be a complete list of foods and beverages you can eat and drink. Contact a dietitian for more information. What foods should I avoid? Fruits Fruits canned with syrup. Vegetables Canned vegetables. Frozen vegetables with butter or cream sauce. Grains Refined white flour and flour products such as bread, pasta, snack foods, and cereals. Avoid all processed foods. Meats and other proteins Fatty cuts of meat.  Poultry with skin. Breaded or fried meats. Processed meat. Avoid saturated fats. Dairy Full-fat yogurt, cheese, or milk. Beverages Sweetened drinks, such as soda or iced tea. The items listed above may not be a complete list of foods and beverages you should avoid. Contact a dietitian for more information. Questions to ask a health care provider Do I need to meet with a certified diabetes care and education specialist? Do I need to meet with a dietitian? What number can I call if I have questions? When are the best times to check my blood glucose? Where to find more information: American Diabetes Association: diabetes.org Academy of Nutrition and Dietetics: eatright.[employer] of Diabetes and Digestive and Kidney Diseases: AmenCredit.is Association of Diabetes Care & Education Specialists: diabeteseducator.org Summary It is important to have healthy eating habits because your blood sugar (glucose) levels are greatly affected by what you eat and drink. It is important to use alcohol carefully. A healthy meal plan will help you manage your blood glucose and lower your risk of heart disease. Your health care provider may recommend that you work with  a dietitian to make a meal plan that is best for you. This information is not intended to replace advice given to you by your health care provider. Make sure you discuss any questions you have with your health care provider. Document Revised: 03/28/2020 Document Reviewed: 03/28/2020 Elsevier Patient Education  [place].

## 2022-06-30 NOTE — Progress Notes (Signed)
Subjective:     Patient ID: Virginia Mcdonald , female    DOB: 02/26/1976 , 46 y.o.   MRN: [id number]   Chief Complaint  Patient presents with   Diabetes    HPI  The patient is here today for a follow-up for diabetes. Patient reports compliance with her meds.  She denies headaches, chest pain and shortness of breath.   Diabetes She presents for her follow-up diabetic visit. She has type 2 diabetes mellitus. Her disease course has been stable. Pertinent negatives for hypoglycemia include no dizziness or headaches. Pertinent negatives for diabetes include no polydipsia, no polyphagia and no polyuria. There are no hypoglycemic complications. There are no diabetic complications. Risk factors for coronary artery disease include diabetes mellitus. (Blood sugars in am 89 -131 range. )     Past Medical History:  Diagnosis Date   Diet-controlled diabetes mellitus (HCC)    type 2   Fibroids      Family History  Problem Relation Age of Onset   Diabetes Mother    Hypertension Mother    Hypertension Father    Hypertension Sister    Diabetes Sister    Audiological scientist    Healthy Daughter    Healthy Brother    Healthy Brother    Healthy Daughter    Healthy Daughter      Current Outpatient Medications:    Accu-Chek FastClix Lancets MISC, Use as directed to check blood sugars 1 time per day dx: e11.65, Disp: 50 each, Rfl: 11   Cholecalciferol (VITAMIN D-3) 125 MCG (5000 UT) TABS, Take 5,000 Units by mouth as needed., Disp: , Rfl:    Continuous Blood Gluc Sensor (FREESTYLE LIBRE 3 SENSOR) MISC, Use to check blood sugar 3 times a day. Dx code e11.65, Disp: 3 each, Rfl: 3   dapagliflozin propanediol (FARXIGA) 10 MG TABS tablet, Take 1 tablet (10 mg total) by mouth daily before breakfast., Disp: 90 tablet, Rfl: 1   glucose blood (ACCU-CHEK GUIDE) test strip, Use as instructed to check blood sugars 1 time per day dx: e11.65, Disp: 50 each, Rfl: 11   mefloquine (LARIAM) 250 MG tablet,  One tab po once weekly, starting one week prior to your trip, throughout trip and four weeks once you return (Patient not taking: Reported on 06/30/2022), Disp: 8 tablet, Rfl: 0   Allergies  Allergen Reactions   Penicillins Anaphylaxis   Procardia [Nifedipine] Palpitations     Review of Systems  Constitutional: Negative.   Eyes: Negative.   Respiratory: Negative.    Cardiovascular: Negative.   Gastrointestinal: Negative.   Endocrine: Negative for polydipsia, polyphagia and polyuria.  Musculoskeletal:  Positive for back pain.       She c/o back pain. Denies fall/trauma. She denies LE weakness/paresthesias and urinary incontinence.   Skin: Negative.   Neurological: Negative.  Negative for dizziness and headaches.  Psychiatric/Behavioral: Negative.       Today's Vitals   06/30/22 1046  BP: 112/70  Pulse: 75  Temp: 98.4 F (36.9 C)  Weight: 134 lb (60.8 kg)  Height: 5\' 3"  (1.6 m)  PainSc: 0-No pain   Body mass index is 23.74 kg/m.  Wt Readings from Last 3 Encounters:  06/30/22 134 lb (60.8 kg)  05/19/22 130 lb (59 kg)  05/15/22 130 lb (59 kg)     Objective:  Physical Exam Vitals and nursing note reviewed.  Constitutional:      Appearance: Normal appearance.  HENT:     Head: Normocephalic and  atraumatic.     Nose:     Comments: Masked     Mouth/Throat:     Comments: Masked  Eyes:     Extraocular Movements: Extraocular movements intact.  Cardiovascular:     Rate and Rhythm: Normal rate and regular rhythm.     Heart sounds: Normal heart sounds.  Pulmonary:     Effort: Pulmonary effort is normal.     Breath sounds: Normal breath sounds.  Musculoskeletal:        General: Tenderness present.     Cervical back: Normal range of motion.  Skin:    General: Skin is warm.  Neurological:     General: No focal deficit present.     Mental Status: She is alert.  Psychiatric:        Mood and Affect: Mood normal.        Behavior: Behavior normal.      Assessment And  Plan:     1. Uncontrolled type 2 diabetes mellitus with hyperglycemia (HCC) Comments: Chronic, I will check labs today. Advised to stop using Splenda. She will f/u in 4 months.  - CMP14+EGFR - Hemoglobin A1c  2. Pure hypercholesterolemia Comments: June 2023 lipid results reviewed, LDL 171. She is aware goal LDL<70l, does not wish to start statin therapy at this time. Will start fiber supplement.   3. Mid back pain Comments: Advised to apply topical rub to affected area and to perform stretching exercises. If persistent, I will refer to [facility] She agrees w/ tx plan.   4. BMI 23.0-23.9, adult Comments: She is encouraged to aim for at least 150 minutes of exercise per week.    Patient was given opportunity to ask questions. Patient verbalized understanding of the plan and was able to repeat key elements of the plan. All questions were answered to their satisfaction.   I, Maximino Greenland, MD, have reviewed all documentation for this visit. The documentation on 06/30/22 for the exam, diagnosis, procedures, and orders are all accurate and complete.   IF YOU HAVE BEEN REFERRED TO A SPECIALIST, IT MAY TAKE 1-2 WEEKS TO SCHEDULE/PROCESS THE REFERRAL. IF YOU HAVE NOT HEARD FROM US/SPECIALIST IN TWO WEEKS, PLEASE GIVE [place] A CALL AT [phone].   THE PATIENT IS ENCOURAGED TO PRACTICE SOCIAL DISTANCING DUE TO THE COVID-19 PANDEMIC.

## 2022-07-01 LAB — CMP14+EGFR
ALT: 9 IU/L (ref 0–32)
AST: 17 IU/L (ref 0–40)
Albumin/Globulin Ratio: 1.5 (ref 1.2–2.2)
Albumin: 4.5 g/dL (ref 3.9–4.9)
Alkaline Phosphatase: 96 IU/L (ref 44–121)
BUN/Creatinine Ratio: 13 (ref 9–23)
BUN: 10 mg/dL (ref 6–24)
Bilirubin Total: 0.2 mg/dL (ref 0.0–1.2)
CO2: 21 mmol/L (ref 20–29)
Calcium: 9.7 mg/dL (ref 8.7–10.2)
Chloride: 101 mmol/L (ref 96–106)
Creatinine, Ser: 0.78 mg/dL (ref 0.57–1.00)
Globulin, Total: 3.1 g/dL (ref 1.5–4.5)
Glucose: 87 mg/dL (ref 70–99)
Potassium: 4.8 mmol/L (ref 3.5–5.2)
Sodium: 138 mmol/L (ref 134–144)
Total Protein: 7.6 g/dL (ref 6.0–8.5)
eGFR: 95 mL/min/{1.73_m2} (ref >59)

## 2022-07-01 LAB — HEMOGLOBIN A1C
Est. average glucose Bld gHb Est-mCnc: 151 mg/dL
Hgb A1c MFr Bld: 6.9 % — ABNORMAL HIGH (ref 4.8–5.6)

## 2022-10-16 ENCOUNTER — Encounter: Payer: 59 | Admitting: Internal Medicine

## 2022-12-17 ENCOUNTER — Encounter: Payer: 59 | Admitting: Internal Medicine

## 2022-12-20 ENCOUNTER — Other Ambulatory Visit: Payer: Self-pay | Admitting: Internal Medicine

## 2022-12-23 ENCOUNTER — Ambulatory Visit (INDEPENDENT_AMBULATORY_CARE_PROVIDER_SITE_OTHER): Payer: Self-pay | Admitting: Internal Medicine

## 2022-12-23 ENCOUNTER — Encounter: Payer: Self-pay | Admitting: Internal Medicine

## 2022-12-23 VITALS — BP 118/82 | HR 76 | Temp 98.0°F | Ht 63.0 in | Wt 138.0 lb

## 2022-12-23 DIAGNOSIS — E1165 Type 2 diabetes mellitus with hyperglycemia: Secondary | ICD-10-CM

## 2022-12-23 DIAGNOSIS — E7800 Pure hypercholesterolemia, unspecified: Secondary | ICD-10-CM

## 2022-12-23 DIAGNOSIS — Z6824 Body mass index (BMI) 24.0-24.9, adult: Secondary | ICD-10-CM

## 2022-12-23 NOTE — Patient Instructions (Signed)

## 2022-12-23 NOTE — Progress Notes (Signed)
I,Victoria T Hamilton,acting as a scribe for Gwynneth Aliment, MD.,have documented all relevant documentation on the behalf of Gwynneth Aliment, MD,as directed by  Gwynneth Aliment, MD while in the presence of Gwynneth Aliment, MD.    Subjective:     Patient ID: Virginia Mcdonald , female    DOB: 01/10/1976 , 47 y.o.   MRN: [id number]   Chief Complaint  Patient presents with   Diabetes   Hyperlipidemia    HPI  The patient is here today for a follow-up for diabetes & chol . Patient reports compliance with her meds.  She denies headaches, chest pain and shortness of breath. Unfortunately, her current insurance from [employer] is out of network. She plans to see new provider for her physical to reduce out of pocket costs. She has no other concerns at this time.    Diabetes She presents for her follow-up diabetic visit. She has type 2 diabetes mellitus. Her disease course has been stable. There are no hypoglycemic associated symptoms. Pertinent negatives for hypoglycemia include no dizziness or headaches. Pertinent negatives for diabetes include no polydipsia, no polyphagia and no polyuria. There are no hypoglycemic complications. There are no diabetic complications. Risk factors for coronary artery disease include diabetes mellitus. (Blood sugars in am 89 -131 range. )     Past Medical History:  Diagnosis Date   Diet-controlled diabetes mellitus    type 2   Fibroids      Family History  Problem Relation Age of Onset   Diabetes Mother    Hypertension Mother    Hypertension Father    Hypertension Sister    Diabetes Sister    Audiological scientist    Healthy Daughter    Healthy Brother    Healthy Brother    Healthy Daughter    Healthy Daughter      Current Outpatient Medications:    Accu-Chek FastClix Lancets MISC, Use as directed to check blood sugars 1 time per day dx: e11.65, Disp: 50 each, Rfl: 11   Cholecalciferol (VITAMIN D-3) 125 MCG (5000 UT) TABS, Take 5,000 Units by  mouth as needed., Disp: , Rfl:    Continuous Glucose Sensor (FREESTYLE LIBRE 3 SENSOR) MISC, USE TO CHECK BLOOD SUGAR THREE TIMES DAILY, Disp: 3 each, Rfl: 0   dapagliflozin propanediol (FARXIGA) 10 MG TABS tablet, Take 1 tablet (10 mg total) by mouth daily before breakfast., Disp: 90 tablet, Rfl: 1   glucose blood (ACCU-CHEK GUIDE) test strip, Use as instructed to check blood sugars 1 time per day dx: e11.65, Disp: 50 each, Rfl: 11   mefloquine (LARIAM) 250 MG tablet, One tab po once weekly, starting one week prior to your trip, throughout trip and four weeks once you return (Patient not taking: Reported on 06/30/2022), Disp: 8 tablet, Rfl: 0   Allergies  Allergen Reactions   Penicillins Anaphylaxis   Claritin-D 24 Hour [Loratadine-Pseudoephedrine Er] Other (See Comments)    Pt reports she ran a fever, became very fatigue.    Procardia [Nifedipine] Palpitations     Review of Systems  Constitutional: Negative.   Respiratory: Negative.    Cardiovascular: Negative.   Gastrointestinal: Negative.   Endocrine: Negative for polydipsia, polyphagia and polyuria.  Skin: Negative.   Neurological: Negative.  Negative for dizziness and headaches.  Psychiatric/Behavioral: Negative.       Today's Vitals   12/23/22 1441  BP: 118/82  Pulse: 76  Temp: 98 F (36.7 C)  SpO2: 98%  Weight: 138 lb (62.6 kg)  Height:  (1.6 m)   Body mass index is 24.45 kg/m.  Wt Readings from Last 3 Encounters:  12/23/22 138 lb (62.6 kg)  06/30/22 134 lb (60.8 kg)  05/19/22 130 lb (59 kg)    Objective:  Physical Exam Vitals and nursing note reviewed.  Constitutional:      Appearance: Normal appearance.  HENT:     Head: Normocephalic and atraumatic.  Eyes:     Extraocular Movements: Extraocular movements intact.  Cardiovascular:     Rate and Rhythm: Normal rate and regular rhythm.     Heart sounds: Normal heart sounds.  Pulmonary:     Effort: Pulmonary effort is normal.     Breath sounds: Normal  breath sounds.  Musculoskeletal:     Cervical back: Normal range of motion.  Skin:    General: Skin is warm.  Neurological:     General: No focal deficit present.     Mental Status: She is alert.  Psychiatric:        Mood and Affect: Mood normal.        Behavior: Behavior normal.      Assessment And Plan:     1. Uncontrolled type 2 diabetes mellitus with hyperglycemia Comments: Chronic, she is encouraged to schedule eye exam. I will check labs as below. Importance of dietary/medication compliance stressed to patient. - CMP14+EGFR - CBC - Hemoglobin A1c - Lipid panel - Microalbumin / creatinine urine ratio  2. Pure hypercholesterolemia Comments: Chronic, LDL goal < 70.  She declines statin therapy at this time. - CMP14+EGFR - Lipid panel  3. Body mass index (BMI) of 24.0-24.9 in adult Comments: She is encouraged to aim for at least 150 minutes of exercise/week.     Patient was given opportunity to ask questions. Patient verbalized understanding of the plan and was able to repeat key elements of the plan. All questions were answered to their satisfaction.   I, Gwynneth Aliment, MD, have reviewed all documentation for this visit. The documentation on 12/23/22 for the exam, diagnosis, procedures, and orders are all accurate and complete.   IF YOU HAVE BEEN REFERRED TO A SPECIALIST, IT MAY TAKE 1-2 WEEKS TO SCHEDULE/PROCESS THE REFERRAL. IF YOU HAVE NOT HEARD FROM US/SPECIALIST IN TWO WEEKS, PLEASE GIVE [place] A CALL AT [phone].   THE PATIENT IS ENCOURAGED TO PRACTICE SOCIAL DISTANCING DUE TO THE COVID-19 PANDEMIC.

## 2022-12-24 LAB — HEMOGLOBIN A1C
Est. average glucose Bld gHb Est-mCnc: 151 mg/dL
Hgb A1c MFr Bld: 6.9 % — ABNORMAL HIGH (ref 4.8–5.6)

## 2022-12-24 LAB — CMP14+EGFR
ALT: 12 IU/L (ref 0–32)
AST: 16 IU/L (ref 0–40)
Albumin/Globulin Ratio: 1.4 (ref 1.2–2.2)
Albumin: 4.2 g/dL (ref 3.9–4.9)
Alkaline Phosphatase: 81 IU/L (ref 44–121)
BUN/Creatinine Ratio: 12 (ref 9–23)
BUN: 13 mg/dL (ref 6–24)
Bilirubin Total: 0.2 mg/dL (ref 0.0–1.2)
CO2: 24 mmol/L (ref 20–29)
Calcium: 9.7 mg/dL (ref 8.7–10.2)
Chloride: 103 mmol/L (ref 96–106)
Creatinine, Ser: 1.08 mg/dL — ABNORMAL HIGH (ref 0.57–1.00)
Globulin, Total: 2.9 g/dL (ref 1.5–4.5)
Glucose: 99 mg/dL (ref 70–99)
Potassium: 4.2 mmol/L (ref 3.5–5.2)
Sodium: 141 mmol/L (ref 134–144)
Total Protein: 7.1 g/dL (ref 6.0–8.5)
eGFR: 65 mL/min/{1.73_m2} (ref >59)

## 2022-12-24 LAB — CBC
Hematocrit: 43.6 % (ref 34.0–46.6)
Hemoglobin: 14.1 g/dL (ref 11.1–15.9)
MCH: 29 pg (ref 26.6–33.0)
MCHC: 32.3 g/dL (ref 31.5–35.7)
MCV: 90 fL (ref 79–97)
Platelets: 162 10*3/uL (ref 150–450)
RBC: 4.87 x10E6/uL (ref 3.77–5.28)
RDW: 13.6 % (ref 11.7–15.4)
WBC: 5 10*3/uL (ref 3.4–10.8)

## 2022-12-24 LAB — LIPID PANEL
Chol/HDL Ratio: 4.1 ratio (ref 0.0–4.4)
Cholesterol, Total: 242 mg/dL — ABNORMAL HIGH (ref 100–199)
HDL: 59 mg/dL (ref >39)
LDL Chol Calc (NIH): 165 mg/dL — ABNORMAL HIGH (ref 0–99)
Triglycerides: 105 mg/dL (ref 0–149)
VLDL Cholesterol Cal: 18 mg/dL (ref 5–40)

## 2022-12-25 LAB — MICROALBUMIN / CREATININE URINE RATIO
Creatinine, Urine: 62.3 mg/dL
Microalb/Creat Ratio: <5 mg/g creat (ref 0–29)
Microalbumin, Urine: <3 ug/mL

## 2023-02-16 DIAGNOSIS — E119 Type 2 diabetes mellitus without complications: Secondary | ICD-10-CM

## 2023-02-16 DIAGNOSIS — E1169 Type 2 diabetes mellitus with other specified complication: Secondary | ICD-10-CM

## 2023-02-24 ENCOUNTER — Encounter: Payer: Self-pay | Admitting: Internal Medicine

## 2023-03-17 LAB — HM DIABETES EYE EXAM

## 2023-04-15 ENCOUNTER — Encounter (HOSPITAL_COMMUNITY): Payer: Self-pay | Admitting: Emergency Medicine

## 2023-04-15 ENCOUNTER — Emergency Department (HOSPITAL_COMMUNITY): Payer: PRIVATE HEALTH INSURANCE

## 2023-04-15 ENCOUNTER — Other Ambulatory Visit: Payer: Self-pay

## 2023-04-15 ENCOUNTER — Observation Stay (HOSPITAL_COMMUNITY)
Admission: EM | Admit: 2023-04-15 | Discharge: 2023-04-17 | Disposition: A | Discharge status: Home / Self Care | Payer: PRIVATE HEALTH INSURANCE | Attending: Internal Medicine | Admitting: Internal Medicine

## 2023-04-15 ENCOUNTER — Encounter: Payer: Self-pay | Admitting: Internal Medicine

## 2023-04-15 DIAGNOSIS — I7774 Dissection of vertebral artery: Secondary | ICD-10-CM | POA: Diagnosis not present

## 2023-04-15 DIAGNOSIS — H5461 Unqualified visual loss, right eye, normal vision left eye: Principal | ICD-10-CM | POA: Insufficient documentation

## 2023-04-15 DIAGNOSIS — Z79899 Other long term (current) drug therapy: Secondary | ICD-10-CM | POA: Diagnosis not present

## 2023-04-15 DIAGNOSIS — M542 Cervicalgia: Secondary | ICD-10-CM

## 2023-04-15 DIAGNOSIS — H547 Unspecified visual loss: Secondary | ICD-10-CM

## 2023-04-15 DIAGNOSIS — E119 Type 2 diabetes mellitus without complications: Secondary | ICD-10-CM | POA: Diagnosis not present

## 2023-04-15 DIAGNOSIS — Z7984 Long term (current) use of oral hypoglycemic drugs: Secondary | ICD-10-CM | POA: Insufficient documentation

## 2023-04-15 DIAGNOSIS — E7800 Pure hypercholesterolemia, unspecified: Secondary | ICD-10-CM | POA: Diagnosis present

## 2023-04-15 DIAGNOSIS — E1169 Type 2 diabetes mellitus with other specified complication: Secondary | ICD-10-CM

## 2023-04-15 LAB — BASIC METABOLIC PANEL
Anion gap: 8 (ref 5–15)
BUN: 21 mg/dL — ABNORMAL HIGH (ref 6–20)
CO2: 27 mmol/L (ref 22–32)
Calcium: 9.5 mg/dL (ref 8.9–10.3)
Chloride: 103 mmol/L (ref 98–111)
Creatinine, Ser: 0.77 mg/dL (ref 0.44–1.00)
GFR, Estimated: >60 mL/min (ref >60)
Glucose, Bld: 142 mg/dL — ABNORMAL HIGH (ref 70–99)
Potassium: 3.9 mmol/L (ref 3.5–5.1)
Sodium: 138 mmol/L (ref 135–145)

## 2023-04-15 LAB — CBC
HCT: 41.5 % (ref 36.0–46.0)
Hemoglobin: 13.3 g/dL (ref 12.0–15.0)
MCH: 28.6 pg (ref 26.0–34.0)
MCHC: 32 g/dL (ref 30.0–36.0)
MCV: 89.2 fL (ref 80.0–100.0)
Platelets: 160 10*3/uL (ref 150–400)
RBC: 4.65 MIL/uL (ref 3.87–5.11)
RDW: 14.8 % (ref 11.5–15.5)
WBC: 5.4 10*3/uL (ref 4.0–10.5)
nRBC: 0 % (ref 0.0–0.2)

## 2023-04-15 NOTE — ED Triage Notes (Signed)
Patient coming to ED for evaluation of R neck pain and stiffness.  Reports symptoms started on Sunday.  Yesterday noticed changes to vision in R eye and pain in neck has increased.  Was seen at Urgent Care and sent to ED for evaluation.  Was unable to be seen at ED due to length of wait.  Went to eye doctor today and diagnosed with "diabetes in my eye."

## 2023-04-16 ENCOUNTER — Emergency Department (HOSPITAL_COMMUNITY): Payer: PRIVATE HEALTH INSURANCE

## 2023-04-16 ENCOUNTER — Observation Stay (HOSPITAL_COMMUNITY): Payer: PRIVATE HEALTH INSURANCE

## 2023-04-16 DIAGNOSIS — H547 Unspecified visual loss: Secondary | ICD-10-CM | POA: Diagnosis not present

## 2023-04-16 DIAGNOSIS — G459 Transient cerebral ischemic attack, unspecified: Secondary | ICD-10-CM | POA: Diagnosis not present

## 2023-04-16 DIAGNOSIS — E7800 Pure hypercholesterolemia, unspecified: Secondary | ICD-10-CM | POA: Diagnosis not present

## 2023-04-16 DIAGNOSIS — I7774 Dissection of vertebral artery: Secondary | ICD-10-CM | POA: Diagnosis not present

## 2023-04-16 DIAGNOSIS — E119 Type 2 diabetes mellitus without complications: Secondary | ICD-10-CM | POA: Diagnosis not present

## 2023-04-16 DIAGNOSIS — H5461 Unqualified visual loss, right eye, normal vision left eye: Secondary | ICD-10-CM | POA: Diagnosis present

## 2023-04-16 DIAGNOSIS — H53461 Homonymous bilateral field defects, right side: Secondary | ICD-10-CM

## 2023-04-16 DIAGNOSIS — Z7984 Long term (current) use of oral hypoglycemic drugs: Secondary | ICD-10-CM | POA: Diagnosis not present

## 2023-04-16 DIAGNOSIS — Z79899 Other long term (current) drug therapy: Secondary | ICD-10-CM | POA: Diagnosis not present

## 2023-04-16 LAB — GLUCOSE, CAPILLARY
Glucose-Capillary: 106 mg/dL — ABNORMAL HIGH (ref 70–99)
Glucose-Capillary: 159 mg/dL — ABNORMAL HIGH (ref 70–99)

## 2023-04-16 LAB — TROPONIN I (HIGH SENSITIVITY)
Troponin I (High Sensitivity): <2 ng/L (ref <18)
Troponin I (High Sensitivity): <2 ng/L (ref <18)

## 2023-04-16 LAB — HEMOGLOBIN A1C
Hgb A1c MFr Bld: 7.3 % — ABNORMAL HIGH (ref 4.8–5.6)
Mean Plasma Glucose: 162.81 mg/dL

## 2023-04-16 LAB — CBG MONITORING, ED: Glucose-Capillary: 129 mg/dL — ABNORMAL HIGH (ref 70–99)

## 2023-04-16 MED ORDER — DIAZEPAM 2 MG PO TABS
2.0000 mg | ORAL_TABLET | Freq: Once | ORAL | Status: AC
  Administered 2023-04-16: 2 mg via ORAL
  Filled 2023-04-16: qty 1

## 2023-04-16 MED ORDER — ENOXAPARIN SODIUM 40 MG/0.4ML IJ SOSY
40.0000 mg | PREFILLED_SYRINGE | INTRAMUSCULAR | Status: DC
  Administered 2023-04-16: 40 mg via SUBCUTANEOUS
  Filled 2023-04-16: qty 0.4

## 2023-04-16 MED ORDER — INSULIN ASPART 100 UNIT/ML IJ SOLN
0.0000 [IU] | Freq: Three times a day (TID) | INTRAMUSCULAR | Status: DC
  Administered 2023-04-16 – 2023-04-17 (×2): 1 [IU] via SUBCUTANEOUS
  Filled 2023-04-16: qty 0.09

## 2023-04-16 MED ORDER — SODIUM CHLORIDE (PF) 0.9 % IJ SOLN
INTRAMUSCULAR | Status: AC
  Filled 2023-04-16: qty 50

## 2023-04-16 MED ORDER — METHOCARBAMOL 500 MG PO TABS
500.0000 mg | ORAL_TABLET | Freq: Once | ORAL | Status: AC
  Administered 2023-04-16: 500 mg via ORAL
  Filled 2023-04-16: qty 1

## 2023-04-16 MED ORDER — CLOPIDOGREL BISULFATE 300 MG PO TABS
300.0000 mg | ORAL_TABLET | Freq: Once | ORAL | Status: AC
  Administered 2023-04-16: 300 mg via ORAL
  Filled 2023-04-16: qty 1

## 2023-04-16 MED ORDER — STROKE: EARLY STAGES OF RECOVERY BOOK
Freq: Once | Status: AC
  Filled 2023-04-16: qty 1

## 2023-04-16 MED ORDER — IOHEXOL 350 MG/ML SOLN
75.0000 mL | Freq: Once | INTRAVENOUS | Status: AC | PRN
  Administered 2023-04-16: 75 mL via INTRAVENOUS

## 2023-04-16 MED ORDER — ONDANSETRON HCL 4 MG PO TABS: 4.0000 mg | ORAL_TABLET | Freq: Four times a day (QID) | ORAL | Status: DC | PRN

## 2023-04-16 MED ORDER — CLOPIDOGREL BISULFATE 75 MG PO TABS
75.0000 mg | ORAL_TABLET | Freq: Every day | ORAL | Status: DC
  Administered 2023-04-17: 75 mg via ORAL
  Filled 2023-04-16: qty 1

## 2023-04-16 MED ORDER — ACETAMINOPHEN 325 MG PO TABS
650.0000 mg | ORAL_TABLET | Freq: Four times a day (QID) | ORAL | Status: DC | PRN
  Administered 2023-04-16: 650 mg via ORAL
  Filled 2023-04-16: qty 2

## 2023-04-16 MED ORDER — ACETAMINOPHEN 650 MG RE SUPP: 650.0000 mg | Freq: Four times a day (QID) | RECTAL | Status: DC | PRN

## 2023-04-16 MED ORDER — ONDANSETRON HCL 4 MG/2ML IJ SOLN: 4.0000 mg | Freq: Four times a day (QID) | INTRAMUSCULAR | Status: DC | PRN

## 2023-04-16 MED ORDER — ASPIRIN 81 MG PO CHEW
324.0000 mg | CHEWABLE_TABLET | Freq: Once | ORAL | Status: AC
  Administered 2023-04-16: 324 mg via ORAL
  Filled 2023-04-16: qty 4

## 2023-04-16 MED ORDER — SODIUM CHLORIDE 0.9 % IV SOLN: INTRAVENOUS | Status: DC

## 2023-04-16 NOTE — ED Notes (Signed)
ED Provider at bedside. 

## 2023-04-16 NOTE — Progress Notes (Signed)
Received pt on the unit. Got pt oriented to the room and pt is in bed with alarm on and call bell within reach

## 2023-04-16 NOTE — Consult Note (Addendum)
Neurology Consultation Reason for Consult: vision changes Referring Physician: Dr. Robb Matar  CC: transient vision sxs  History is obtained from: patient  HPI: Virginia Mcdonald is a 47 y.o. female with type 2 diabetes who presented yesterday with transient vision changes in her right visual field that lasted about 20 minutes two days ago. She experienced sudden loss of vision followed by blurry vision. She denies any focal weakness, dysarthria, dysphagia, gait impairment, fevers, chills, chest pain, SOB, abd pain, n/v/d, or any other sxs at this time.   She denies any recent motor vehicles accidents/whiplash injuries. She does not see a chiropractor and denies any neck manipulation. Denies any recent visit to hair salon/neck in hyperextension.      ROS: A 14 point ROS was performed and is negative except as noted in the HPI.   Past Medical History:  Diagnosis Date   Diet-controlled diabetes mellitus (HCC)    type 2   Fibroids      Family History  Problem Relation Age of Onset   Diabetes Mother    Hypertension Mother    Hypertension Father    Hypertension Sister    Diabetes Sister    Healthy Brother    Healthy Daughter    Healthy Brother    Healthy Brother    Healthy Daughter    Healthy Daughter      Social History:  reports that she has never smoked. She has never used smokeless tobacco. She reports that she does not drink alcohol and does not use drugs.   Exam: Current vital signs: BP 117/74 (BP Location: Right Arm)   Pulse 83   Temp 99.3 F (37.4 C) (Oral)   Resp 16   Ht 5\' 3"  (1.6 m)   Wt 60.8 kg   LMP 10/23/2020 Comment: S/P hysterectomy   SpO2 100%   BMI 23.74 kg/m  Vital signs in last 24 hours: Temp:  [97.6 F (36.4 C)-99.3 F (37.4 C)] 99.3 F (37.4 C) (08/08 1438) Pulse Rate:  [59-88] 83 (08/08 1438) Resp:  [15-18] 16 (08/08 1438) BP: (116-152)/(74-95) 117/74 (08/08 1438) SpO2:  [93 %-100 %] 100 % (08/08 1438) Weight:  [60.8 kg] 60.8 kg  (08/07 2127)   Physical Exam  Appears well-developed and well-nourished.   Neuro: Mental Status: Patient is awake, alert, oriented to person, place, month, year, and situation. Patient is able to give a clear and coherent history. No signs of aphasia or neglect Cranial Nerves: II: Visual Fields are full. Pupils are equal, round, and reactive to light.   III,IV, VI: EOMI without ptosis or diploplia.  V: Facial sensation is symmetric to temperature VII: Facial movement is symmetric.  VIII: hearing is intact to voice X: Uvula elevates symmetrically XI: Shoulder shrug is symmetric. XII: tongue is midline without atrophy or fasciculations.  Motor: Tone is normal. Bulk is normal. 5/5 strength was present in all four extremities.  Sensory: Sensation is symmetric to light touch and temperature in the arms and legs. Cerebellar: FNF and HKS are intact bilaterally      I have reviewed labs in epic and the results pertinent to this consultation are: A1c 7.3% BMP, CBC unremarkable   I have reviewed the images obtained: CTA IMPRESSION: 1. 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 Moderate stenosis of the vessel results, but it remains patent and normalizes at the C3-C4 level.   2. No  evidence of underlying atherosclerosis and no other arterial abnormality on CTA Head and Neck; tortuous cervical carotid arteries.   3. Normal CT appearance of the brain.  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 The patient's sxs have resolved at this point and she was loaded with both aspirin and plavix. She likely experienced a TIA.   Recommendations: 1) Aspirin 81 mg and plavix 75 mg daily x  3 months then aspirin thereafter 2) Repeat CT angio in 2-3 months 3) Echocardiogram w/ bubble study 4) Lipid panel  5) PT/OT 6) Stroke team to follow    Elza Rafter, DO Internal Medicine Resident, PGY-3  NEUROHOSPITALIST ADDENDUM Performed a face to face diagnostic evaluation.   I have reviewed the contents of history and physical exam as documented by PA/ARNP/Resident and agree with above documentation.  I have discussed and formulated the above plan as documented. Edits to the note have been made as needed.  Impression/Key exam findings/Plan: 20 misn episode of R homonymous hemianopsia with spontaneous resolution. 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 It is possible for a thrombus to form on top of dissection which could have gone to basilar and subsequently to L PCA and cause TIA like episode. This may explain her presentation. Aspirin and plavix for 3 months, followed by Aspirin alone and will need full stroke workup and repeat CT Angio head and neck in 2-3 months.  Erick Blinks, MD [facility] [phone]   If 7pm to 7am, please call on call as listed on AMION.

## 2023-04-16 NOTE — ED Notes (Signed)
Patient transported to CT 

## 2023-04-16 NOTE — H&P (Signed)
History and Physical    Patient: Virginia Mcdonald UJW:[id number] DOB: [birth year] DOA: 04/15/2023 DOS: the patient was seen and examined on 04/16/2023 PCP: Dorothyann Peng, MD  Patient coming from: Home  Chief Complaint:  Chief Complaint  Patient presents with   Torticollis   Visual Field Change   HPI: [name] is a 47 y.o. female with medical history significant of diet-controlled type 2 diabetes, fibroids who presented to the emergency department with complaints of right eye than right sided neck pain since Sunday.  Yesterday she noticed she had some vision changes in her right eye that lasted for about 20 minutes where she had a sudden vision loss followed by colors to light and blurry vision and worsening her cervical pain she went through urgent care will refer her to the emergency department.  She was also seen by ophthalmology this week who told her she has "diabetes and her eyes closed " He denied focal weakness or numbness.  No slurred speech or gait impairment.  No fever, chills, rhinorrhea, sore throat, wheezing or hemoptysis.  No chest pain, palpitations, diaphoresis, PND, orthopnea or pitting edema of the lower extremities.  No abdominal pain, nausea, emesis, diarrhea, constipation, melena or hematochezia.  No flank pain, dysuria, frequency or hematuria.  No polyuria, polydipsia, polyphagia or blurred vision.   Lab work: Her CBC was normal.  Troponin x 2 unremarkable.  BMP showed a glucose 142 and BUN 21 mg/dL.  The rest of the BMP was normal.  Imaging: MRI of the brain without contrast with no acute ischemia.  No acute or chronic hemorrhagic.  Normal white matter signal and CSF spaces.  MRI of the orbits was normal.  CTA head and neck was significant for acute right vertebral artery dissection.  Moderate stenosis of the best results, but he remains patent and normalizes at the C3-C4 level.  Tortuous cervical carotid arteries.  No evidence of underlying atherosclerosis and  no other arterial normality on CTA head and neck.  ED course: Initial vital signs were temperature 98.1 F, pulse 70, respiration 18, BP 130/81 mmHg O2 sat 100% on room air.  Patient received aspirin 324 mg p.o., clopidogrel 300 mg p.o., and diazepam 2 mg p.o. and methocarbamol 500 mg p.o.   Review of Systems: As mentioned in the history of present illness. All other systems reviewed and are negative. Past Medical History:  Diagnosis Date   Diet-controlled diabetes mellitus (HCC)    type 2   Fibroids    Past Surgical History:  Procedure Laterality Date   CESAREAN SECTION  2003   ROBOTIC ASSISTED LAPAROSCOPIC HYSTERECTOMY AND SALPINGECTOMY Bilateral 10/23/2020   Procedure: XI ROBOTIC ASSISTED LAPAROSCOPIC HYSTERECTOMY AND BILATERAL SALPINGECTOMY;  Surgeon: Gerald Leitz, MD;  Location: [facility];  Service: Gynecology;  Laterality: Bilateral;   Social History:  reports that she has never smoked. She has never used smokeless tobacco. She reports that she does not drink alcohol and does not use drugs.  Allergies  Allergen Reactions   Penicillins Anaphylaxis   Claritin-D 24 Hour [Loratadine-Pseudoephedrine Er] Other (See Comments)    Pt reports she ran a fever, became very fatigue.    Procardia [Nifedipine] Palpitations    Family History  Problem Relation Age of Onset   Diabetes Mother    Hypertension Mother    Hypertension Father    Hypertension Sister    Diabetes Sister    Healthy Brother    Healthy Daughter    Healthy Brother  Healthy Brother    Healthy Daughter    Healthy Daughter     Prior to Admission medications   Medication Sig Start Date End Date Taking? Authorizing Provider  glipiZIDE (GLUCOTROL XL) 10 MG 24 hr tablet Take 1 tablet by mouth daily. 02/17/23 08/16/23 Yes [provider]  Accu-Chek FastClix Lancets MISC Use as directed to check blood sugars 1 time per day dx: e11.65 03/13/21   Dorothyann Peng, MD  Continuous Glucose Sensor (FREESTYLE  LIBRE 3 SENSOR) MISC USE TO CHECK BLOOD SUGAR THREE TIMES DAILY 12/22/22   Dorothyann Peng, MD  dapagliflozin propanediol (FARXIGA) 10 MG TABS tablet Take 1 tablet (10 mg total) by mouth daily before breakfast. Patient not taking: Reported on 04/16/2023 05/15/22   Dorothyann Peng, MD  glucose blood (ACCU-CHEK GUIDE) test strip Use as instructed to check blood sugars 1 time per day dx: e11.65 05/28/21   Dorothyann Peng, MD    Physical Exam: Vitals:   04/16/23 0137 04/16/23 0315 04/16/23 0518 04/16/23 0700  BP: 116/83 136/84 125/83 125/81  Pulse: 72 65 60 (!) 59  Resp: 17 16 18 16   Temp: 98 F (36.7 C)  97.6 F (36.4 C)   TempSrc: Oral  Oral   SpO2: 100% 99% 99% 97%  Weight:      Height:       Physical Exam Vitals and nursing note reviewed.  Constitutional:      General: She is awake. She is not in acute distress.    Appearance: Normal appearance.  HENT:     Head: Normocephalic.     Nose: No rhinorrhea.     Mouth/Throat:     Mouth: Mucous membranes are moist.  Eyes:     General: No scleral icterus.    Pupils: Pupils are equal, round, and reactive to light.  Neck:     Vascular: No JVD.  Cardiovascular:     Rate and Rhythm: Normal rate and regular rhythm.     Heart sounds: S1 normal and S2 normal.  Pulmonary:     Effort: Pulmonary effort is normal.     Breath sounds: Normal breath sounds. No wheezing, rhonchi or rales.  Abdominal:     General: Bowel sounds are normal.     Palpations: Abdomen is soft.     Tenderness: There is no abdominal tenderness. There is no right CVA tenderness or left CVA tenderness.  Musculoskeletal:     Cervical back: Neck supple.     Right lower leg: No edema.     Left lower leg: No edema.  Skin:    General: Skin is warm and dry.  Neurological:     General: No focal deficit present.     Mental Status: She is alert and oriented to person, place, and time.  Psychiatric:        Mood and Affect: Mood normal.        Behavior: Behavior normal. Behavior is  cooperative.     Data Reviewed:  Results are pending, will review when available.  Assessment and Plan: Principal Problem:   Vision loss In the setting of:   Vertebral artery dissection (HCC) Observation/PCU. Frequent neurochecks. Consult PT and OT. Check fasting lipids. Check hemoglobin A1c. Check echocardiogram. Risk factors modifications. Stroke team input appreciated.  Active Problems:   Pure hypercholesterolemia Check fasting lipid profile.    Type 2 diabetes mellitus without complication,  without long-term current use of insulin (HCC) Does not want to take glipizide due to side effect. Carbohydrate modified diet.  CBG monitoring with RI SS. Check hemoglobin A1c.    Advance Care Planning:   Code Status: Full Code   Consults: Neurohospitalist team.  Family Communication:   Severity of Illness: The appropriate patient status for this patient is OBSERVATION. Observation status is judged to be reasonable and necessary in order to provide the required intensity of service to ensure the patient's safety. The patient's presenting symptoms, physical exam findings, and initial radiographic and laboratory data in the context of their medical condition is felt to place them at decreased risk for further clinical deterioration. Furthermore, it is anticipated that the patient will be medically stable for discharge from the hospital within 2 midnights of admission.   Author: Bobette Mo, MD 04/16/2023 7:55 AM  For on call review [email or web address].   This document was prepared using Dragon voice recognition software and may contain some unintended transcription errors.

## 2023-04-16 NOTE — ED Provider Notes (Signed)
[place] EMERGENCY DEPARTMENT AT [facility] Provider Note   CSN: [id number] Arrival date & time: 04/15/23  2047     History  Chief Complaint  Patient presents with   Torticollis   Visual Field Change    Virginia Mcdonald is a 47 y.o. female.  Patient with a history of diabetes here with a 4-day history of right sided paraspinal neck and upper back pain.  Denies any fall or injury.  States pain is constant and radiates down her right arm.  No weakness in the arm.  During the day in August 6 she had a brief episode of loss of vision in her right eye.  States she was sitting at a computer when all of a sudden her vision went dark in her right eye and was followed by colors of light and blurry vision.  This lasted about 20 minutes and her vision is back to normal now.  She was seen at ophthalmology today who told her she had "diabetes in her eye".  Patient is back to normal now.  No focal weakness to her arms or legs.  No difficulty speaking or difficulty swallowing.  No chest pain, cough or fever.  No abdominal pain, nausea or vomiting.  No headache currently.  No visual changes currently.  States her vision is at baseline.  No spots in her vision.  The history is provided by the patient and the spouse.       Home Medications Prior to Admission medications   Medication Sig Start Date End Date Taking? Authorizing Provider  glipiZIDE (GLUCOTROL XL) 10 MG 24 hr tablet Take 1 tablet by mouth daily. 02/17/23 08/16/23 Yes [provider]  Accu-Chek FastClix Lancets MISC Use as directed to check blood sugars 1 time per day dx: e11.65 03/13/21   Dorothyann Peng, MD  Cholecalciferol (VITAMIN D-3) 125 MCG (5000 UT) TABS Take 5,000 Units by mouth as needed.    [provider]  Continuous Glucose Sensor (FREESTYLE LIBRE 3 SENSOR) MISC USE TO CHECK BLOOD SUGAR THREE TIMES DAILY 12/22/22   Dorothyann Peng, MD  dapagliflozin propanediol (FARXIGA) 10 MG TABS tablet Take 1  tablet (10 mg total) by mouth daily before breakfast. 05/15/22   Dorothyann Peng, MD  glucose blood (ACCU-CHEK GUIDE) test strip Use as instructed to check blood sugars 1 time per day dx: e11.65 05/28/21   Dorothyann Peng, MD  mefloquine (LARIAM) 250 MG tablet One tab po once weekly, starting one week prior to your trip, throughout trip and four weeks once you return Patient not taking: Reported on 06/30/2022 05/15/22   Dorothyann Peng, MD      Allergies    Penicillins, Claritin-d 24 hour [loratadine-pseudoephedrine er], and Procardia [nifedipine]    Review of Systems   Review of Systems  Constitutional:  Negative for activity change, appetite change and fever.  Eyes:  Positive for visual disturbance.  Respiratory:  Negative for cough, chest tightness and shortness of breath.   Cardiovascular:  Negative for chest pain.  Gastrointestinal:  Negative for abdominal pain, nausea and vomiting.  Genitourinary:  Negative for dysuria and hematuria.  Musculoskeletal:  Positive for arthralgias, back pain, myalgias and neck pain.  Skin:  Negative for rash.  Neurological:  Negative for weakness and headaches.   all other systems are negative except as noted in the HPI and PMH.    Physical Exam Updated Vital Signs BP 116/83 (BP Location: Left Arm)   Pulse 72   Temp 98 F (36.7  C) (Oral)   Resp 17   Ht 5\' 3"  (1.6 m)   Wt 60.8 kg   LMP 10/23/2020 Comment: S/P hysterectomy   SpO2 100%   BMI 23.74 kg/m  Physical Exam Vitals and nursing note reviewed.  Constitutional:      General: She is not in acute distress.    Appearance: She is well-developed.  HENT:     Head: Normocephalic and atraumatic.     Mouth/Throat:     Pharynx: No oropharyngeal exudate.  Eyes:     Conjunctiva/sclera: Conjunctivae normal.     Pupils: Pupils are equal, round, and reactive to light.  Neck:     Comments: No meningismus. Paraspinal cervical tenderness on the right at same to scapula. Equal grip strength  bilaterally. Cardiovascular:     Rate and Rhythm: Normal rate and regular rhythm.     Heart sounds: Normal heart sounds. No murmur heard. Pulmonary:     Effort: Pulmonary effort is normal. No respiratory distress.     Breath sounds: Normal breath sounds.  Chest:     Chest wall: No tenderness.  Abdominal:     Palpations: Abdomen is soft.     Tenderness: There is no abdominal tenderness. There is no guarding or rebound.  Musculoskeletal:        General: Tenderness present. Normal range of motion.     Cervical back: Normal range of motion and neck supple.     Comments: Paraspinal cervical tenderness on the right.  Equal grip strength bilaterally.  Skin:    General: Skin is warm.  Neurological:     Mental Status: She is alert and oriented to person, place, and time.     Cranial Nerves: No cranial nerve deficit.     Motor: No abnormal muscle tone.     Coordination: Coordination normal.     Comments:  5/5 strength throughout. CN 2-12 intact.Equal grip strength.   Psychiatric:        Behavior: Behavior normal.     ED Results / Procedures / Treatments   Labs (all labs ordered are listed, but only abnormal results are displayed) Labs Reviewed  BASIC METABOLIC PANEL - Abnormal; Notable for the following components:      Result Value   Glucose, Bld 142 (*)    BUN 21 (*)    All other components within normal limits  CBC  TROPONIN I (HIGH SENSITIVITY)  TROPONIN I (HIGH SENSITIVITY)    EKG EKG Interpretation Date/Time:  Wednesday April 15 2023 21:47:14 EDT Ventricular Rate:  78 PR Interval:  155 QRS Duration:  83 QT Interval:  381 QTC Calculation: 434 R Axis:   28  Text Interpretation: Sinus rhythm Low voltage, precordial leads No significant change was found Confirmed by Glynn Octave [phone]) on 04/16/2023 3:00:01 AM  Radiology CT ANGIO HEAD NECK W WO CM  Result Date: 04/16/2023 CLINICAL DATA:  47 year old female with neck pain and stiffness. Vision changes. EXAM: CT  ANGIOGRAPHY HEAD AND NECK WITH AND WITHOUT CONTRAST TECHNIQUE: Multidetector CT imaging of the head and neck was performed using the standard protocol during bolus administration of intravenous contrast. Multiplanar CT image reconstructions and MIPs were obtained to evaluate the vascular anatomy. Carotid stenosis measurements (when applicable) are obtained utilizing NASCET criteria, using the distal internal carotid diameter as the denominator. RADIATION DOSE REDUCTION: This exam was performed according to the departmental dose-optimization program which includes automated exposure control, adjustment of the mA and/or kV according to patient size and/or use of iterative reconstruction  technique. CONTRAST:  75mL OMNIPAQUE IOHEXOL 350 MG/ML SOLN COMPARISON:  Brain and orbit MRI without 2327 hours yesterday. FINDINGS: CT HEAD Brain: Normal cerebral volume. No midline shift, ventriculomegaly, mass effect, evidence of mass lesion, intracranial hemorrhage or evidence of cortically based acute infarction. Gray-white matter differentiation is within normal limits throughout the brain. Calvarium and skull base: Negative. Paranasal sinuses: Small right ethmoid osteoma (normal variant). Otherwise visualized paranasal sinuses and mastoids are clear. Orbits: Visualized orbits and scalp soft tissues are within normal limits. CTA NECK Skeleton: Negative. Upper chest: Negative. Other neck: Dental streak artifact.  Otherwise negative. Aortic arch: Normal 3 vessel arch. Right carotid system: Mild tortuosity.  No plaque or stenosis. Left carotid system: Similar mild to moderate tortuosity. No plaque or stenosis. Vertebral arteries: Mildly tortuous proximal right subclavian artery and right vertebral artery origin with no plaque or stenosis. Streak artifact from dense paravertebral venous contrast in the proximal V1 segment (such as series 14, image 234. However, the vessel becomes abnormal at the V1-V2 junction with an irregular,  diminutive contour (series 15, image 156). The vessel remains abnormal along a segment of 4-5 cm, with abrupt more normal right vertebral V2 caliber attained at the C3-C4 level on series 15, image 145. Beyond that point the vessel appears fairly smooth and normally patent to the skull base. Proximal left subclavian artery appears negative. The left vertebral artery origin and V1 segment is partially obscured by dense venous contrast artifact. But the visible distal V1 and proximal V2 segment is normal. The left vertebral is codominant and within normal limits to the skull base. CTA HEAD Posterior circulation: Codominant and normal distal vertebral arteries and vertebrobasilar junction. Both PICA origins are normal. Patent basilar artery without stenosis. Patent SCA and PCA origins. Both posterior communicating arteries are present. Bilateral PCA branches are within normal limits. Anterior circulation: Both ICA siphons are patent with no plaque or stenosis. Posterior communicating artery origins are normal. Carotid termini, MCA and ACA origins are patent and normal. Anterior communicating artery and bilateral ACA branches are within normal limits. Left MCA M1 segment and bifurcation are patent without stenosis. Right MCA M1 segment and bifurcation are patent without stenosis. Bilateral MCA branches are within normal limits. Venous sinuses: Patent. Anatomic variants: None. Review of the MIP images confirms the above findings IMPRESSION: 1. 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 Moderate stenosis of the vessel results, but it remains patent and normalizes at the C3-C4 level. 2. No evidence of underlying atherosclerosis and no other arterial abnormality on CTA Head and Neck; tortuous cervical carotid arteries. 3. Normal CT appearance of the brain. Electronically Signed    By: Odessa Fleming M.D.   On: 04/16/2023 05:29   MR BRAIN WO CONTRAST  Result Date: 04/16/2023 CLINICAL DATA:  Orbital trauma EXAM: MRI HEAD AND ORBITS WITHOUT CONTRAST TECHNIQUE: Multiplanar, multi-echo pulse sequences of the brain and surrounding structures were acquired without intravenous contrast. Multiplanar, multi-echo pulse sequences of the orbits and surrounding structures were acquired including fat saturation techniques, without intravenous contrast administration. COMPARISON:  None Available. FINDINGS: MRI HEAD FINDINGS Brain: No acute ischemia. No acute or chronic hemorrhage. Normal white matter signal and CSF spaces. Midline structures are normal. Vascular: Normal flow voids. Skull and upper cervical spine: Normal marrow signal. Other: None MRI ORBITS FINDINGS Orbits: No orbital mass or evidence of inflammation. Normal appearance of the globes, optic  nerve-sheath complexes, extraocular muscles, orbital fat and lacrimal glands. Visualized sinuses: Clear. Soft tissues: Normal. IMPRESSION: Normal MRI of the brain and orbits. Electronically Signed   By: Deatra Robinson M.D.   On: 04/16/2023 00:01   MR ORBITS WO CONTRAST  Result Date: 04/16/2023 CLINICAL DATA:  Orbital trauma EXAM: MRI HEAD AND ORBITS WITHOUT CONTRAST TECHNIQUE: Multiplanar, multi-echo pulse sequences of the brain and surrounding structures were acquired without intravenous contrast. Multiplanar, multi-echo pulse sequences of the orbits and surrounding structures were acquired including fat saturation techniques, without intravenous contrast administration. COMPARISON:  None Available. FINDINGS: MRI HEAD FINDINGS Brain: No acute ischemia. No acute or chronic hemorrhage. Normal white matter signal and CSF spaces. Midline structures are normal. Vascular: Normal flow voids. Skull and upper cervical spine: Normal marrow signal. Other: None MRI ORBITS FINDINGS Orbits: No orbital mass or evidence of inflammation. Normal appearance of the globes,  optic nerve-sheath complexes, extraocular muscles, orbital fat and lacrimal glands. Visualized sinuses: Clear. Soft tissues: Normal. IMPRESSION: Normal MRI of the brain and orbits. Electronically Signed   By: Deatra Robinson M.D.   On: 04/16/2023 00:01    Procedures .Critical Care  Performed by: Glynn Octave, MD Authorized by: Glynn Octave, MD   Critical care provider statement:    Critical care time (minutes):  45   Critical care time was exclusive of:  Separately billable procedures and treating other patients   Critical care was necessary to treat or prevent imminent or life-threatening deterioration of the following conditions:  CNS failure or compromise (verterbral artery dissection)   Critical care was time spent personally by me on the following activities:  Development of treatment plan with patient or surrogate, discussions with consultants, evaluation of patient's response to treatment, examination of patient, ordering and review of laboratory studies, ordering and review of radiographic studies, ordering and performing treatments and interventions, pulse oximetry, re-evaluation of patient's condition, review of old charts, blood draw for specimens and obtaining history from patient or surrogate   I assumed direction of critical care for this patient from another provider in my specialty: no     Care discussed with: admitting provider       Medications Ordered in ED Medications  methocarbamol (ROBAXIN) tablet 500 mg (has no administration in time range)  diazepam (VALIUM) tablet 2 mg (has no administration in time range)    ED Course/ Medical Decision Making/ A&P                                 Medical Decision Making Amount and/or Complexity of Data Reviewed Labs: ordered. Decision-making details documented in ED Course. Radiology: ordered and independent interpretation performed. Decision-making details documented in ED Course. ECG/medicine tests: ordered and  independent interpretation performed. Decision-making details documented in ED Course.  Risk OTC drugs. Prescription drug management. Decision regarding hospitalization.  4 days of right-sided neck pain without injury.  Equal grip strength, sensation and pulses.  No headache.  EKG without acute ischemia.  Nonfocal neurological exam.  Concern for possible cervical radiculopathy.  Visual changes in the right eye yesterday have since resolved.  Visual Acuity  Bilateral Near 20/50      Bilateral Distance 20/50      R Near 20/50      R Distance 20/50      L Near 20/100      L Distance 20/50        Discussed with Dr. Otelia Limes on  neurology.  He does agree concern for possible amaurosis fugax and recommends stroke workup.  MRI is negative for stroke or infarct.  CTA is positive for vertebral artery dissection on the right.  Discussed with Dr. Margo Aye of radiology.  This will likely explain her neck pain and possibly visual loss if she is having embolic strokes.  Loaded with aspirin.  Discussed with Dr. Otelia Limes and neurology who agrees with transfer to [facility] he will evaluate.  Patient may need Plavix or further anticoagulation.  Admission discussed with Dr. Emmit Pomfret.         Final Clinical Impression(s) / ED Diagnoses Final diagnoses:  Vertebral artery dissection [facility])    Rx / DC Orders ED Discharge Orders     None         Deashia Soule, Jeannett Senior, MD 04/16/23 [phone]

## 2023-04-16 NOTE — ED Notes (Signed)
ED TO INPATIENT HANDOFF REPORT  ED Nurse Name and Phone #: Casimiro Needle [phone]  S Name/Age/Gender Virginia Mcdonald 47 y.o. female Room/Bed: WA20/WA20  Code Status   Code Status: Full Code  Home/SNF/Other Home Patient oriented to: self, place, time, and situation Is this baseline? Yes   Triage Complete: Triage complete  Chief Complaint Vision loss [H54.7]  Triage Note Patient coming to ED for evaluation of R neck pain and stiffness.  Reports symptoms started on Sunday.  Yesterday noticed changes to vision in R eye and pain in neck has increased.  Was seen at Urgent Care and sent to ED for evaluation.  Was unable to be seen at ED due to length of wait.  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    Procardia [Nifedipine] Palpitations    Level of Care/Admitting Diagnosis ED Disposition     ED Disposition  Admit   Condition  --   Comment  Hospital Area: [facility] [[id number]]  Level of Care: Telemetry Cardiac [103]  May place patient in observation at [facility] or [facility] if equivalent level of care is available:: No  Covid Evaluation: Asymptomatic - no recent exposure (last 10 days) testing not required  Diagnosis: Vision loss [253664]  Admitting Physician: Tonye Royalty [[id number]]  Attending Physician: Janann Colonel          B Medical/Surgery History Past Medical History:  Diagnosis Date   Diet-controlled diabetes mellitus (HCC)    type 2   Fibroids    Past Surgical History:  Procedure Laterality Date   CESAREAN SECTION  2003   ROBOTIC ASSISTED LAPAROSCOPIC HYSTERECTOMY AND SALPINGECTOMY Bilateral 10/23/2020   Procedure: XI ROBOTIC ASSISTED LAPAROSCOPIC HYSTERECTOMY AND BILATERAL SALPINGECTOMY;   Surgeon: Gerald Leitz, MD;  Location: [facility] [place];  Service: Gynecology;  Laterality: Bilateral;     A IV Location/Drains/Wounds Patient Lines/Drains/Airways Status     Active Line/Drains/Airways     Name Placement date Placement time Site Days   Peripheral IV 04/16/23 20 G Anterior;Left;Proximal Forearm 04/16/23  0308  Forearm  less than 1   Incision (Closed) 10/23/20 Abdomen Other (Comment) 10/23/20  0950  -- 905   Incision (Closed) 10/23/20 Perineum 10/23/20  0950  -- 905   Incision - 5 Ports Abdomen Umbilicus Right;Mid;Lateral Left;Mid;Lateral Left;Lateral Right;Lateral 10/23/20  0828  -- 905            Intake/Output Last 24 hours No intake or output data in the 24 hours ending 04/16/23 1405  Labs/Imaging Results for orders placed or performed during the hospital encounter of 04/15/23 (from the past 48 hour(s))  Troponin I (High Sensitivity)     Status: None   Collection Time: 04/15/23  9:50 PM  Result Value Ref Range   Troponin I (High Sensitivity) <2 <18 ng/L    Comment: (NOTE) Elevated high sensitivity troponin I (hsTnI) values and significant  changes across serial measurements may suggest ACS but many other  chronic and acute conditions are known to elevate hsTnI results.  Refer to the "Links" section for chest pain algorithms and additional  guidance. Performed at [facility], [address], [place], [place] [address]   CBC     Status: None   Collection Time: 04/15/23  9:51 PM  Result Value Ref Range   WBC 5.4 4.0 - 10.5 K/uL   RBC 4.65 3.87 - 5.11 MIL/uL   Hemoglobin 13.3 12.0 - 15.0 g/dL   HCT 29.5 62.1 - 30.8 %   MCV 89.2 80.0 - 100.0 fL   MCH 28.6 26.0 - 34.0 pg   MCHC 32.0 30.0 - 36.0 g/dL   RDW 65.7 84.6 - 96.2 %   Platelets 160 150 - 400 K/uL   nRBC 0.0 0.0 - 0.2 %    Comment: Performed at [facility], [address] [place], [place] [address]  Basic metabolic panel     Status: Abnormal    Collection Time: 04/15/23  9:51 PM  Result Value Ref Range   Sodium 138 135 - 145 mmol/L   Potassium 3.9 3.5 - 5.1 mmol/L   Chloride 103 98 - 111 mmol/L   CO2 27 22 - 32 mmol/L   Glucose, Bld 142 (H) 70 - 99 mg/dL    Comment: Glucose reference range applies only to samples taken after fasting for at least 8 hours.   BUN 21 (H) 6 - 20 mg/dL   Creatinine, Ser 1.32 0.44 - 1.00 mg/dL   Calcium 9.5 8.9 - 44.0 mg/dL   GFR, Estimated >10 >27 mL/min    Comment: (NOTE) Calculated using the CKD-EPI Creatinine Equation (2021)    Anion gap 8 5 - 15    Comment: Performed at [facility], [address], [place], [place] [address]  Hemoglobin A1c     Status: Abnormal   Collection Time: 04/15/23  9:51 PM  Result Value Ref Range   Hgb A1c MFr Bld 7.3 (H) 4.8 - 5.6 %    Comment: (NOTE) Pre diabetes:          5.7%-6.4%  Diabetes:              >6.4%  Glycemic control for   <7.0% adults with diabetes    Mean Plasma Glucose 162.81 mg/dL    Comment: Performed at [facility], [address] [place], [place] [address]  Troponin I (High Sensitivity)     Status: None   Collection Time: 04/16/23  5:50 AM  Result Value Ref Range   Troponin I (High Sensitivity) <2 <18 ng/L    Comment: (NOTE) Elevated high sensitivity troponin I (hsTnI) values and significant  changes across serial measurements may suggest ACS but many other  chronic and acute conditions are known to elevate hsTnI results.  Refer to the "Links" section for chest pain algorithms and additional  guidance. Performed at [facility] [place], [address] [place], [place] [address]   CBG monitoring, ED     Status: Abnormal   Collection Time: 04/16/23 11:20 AM  Result Value Ref Range   Glucose-Capillary 129 (H) 70 - 99 mg/dL    Comment: Glucose reference range applies only to samples taken after fasting for at least 8 hours.   Comment 1 Notify RN    CT ANGIO HEAD NECK W WO CM  Addendum Date:  04/16/2023   ADDENDUM REPORT: 04/16/2023 06:01 ADDENDUM: Study discussed by telephone with Dr. Glynn Octave on 04/16/2023 at 0531 hours. Electronically Signed   By: Odessa Fleming M.D.   On: 04/16/2023 06:01   Result Date: 04/16/2023 CLINICAL DATA:  47 year old female with neck pain and stiffness. Vision changes. EXAM: CT ANGIOGRAPHY HEAD AND NECK WITH AND WITHOUT CONTRAST TECHNIQUE: Multidetector CT imaging of the head and neck was performed using  the standard protocol during bolus administration of intravenous contrast. Multiplanar CT image reconstructions and MIPs were obtained to evaluate the vascular anatomy. Carotid stenosis measurements (when applicable) are obtained utilizing NASCET criteria, using the distal internal carotid diameter as the denominator. RADIATION DOSE REDUCTION: This exam was performed according to the departmental dose-optimization program which includes automated exposure control, adjustment of the mA and/or kV according to patient size and/or use of iterative reconstruction technique. CONTRAST:  75mL OMNIPAQUE IOHEXOL 350 MG/ML SOLN COMPARISON:  Brain and orbit MRI without 2327 hours yesterday. FINDINGS: CT HEAD Brain: Normal cerebral volume. No midline shift, ventriculomegaly, mass effect, evidence of mass lesion, intracranial hemorrhage or evidence of cortically based acute infarction. Gray-white matter differentiation is within normal limits throughout the brain. Calvarium and skull base: Negative. Paranasal sinuses: Small right ethmoid osteoma (normal variant). Otherwise visualized paranasal sinuses and mastoids are clear. Orbits: Visualized orbits and scalp soft tissues are within normal limits. CTA NECK Skeleton: Negative. Upper chest: Negative. Other neck: Dental streak artifact.  Otherwise negative. Aortic arch: Normal 3 vessel arch. Right carotid system: Mild tortuosity.  No plaque or stenosis. Left carotid system: Similar mild to moderate tortuosity. No plaque or stenosis. Vertebral  arteries: Mildly tortuous proximal right subclavian artery and right vertebral artery origin with no plaque or stenosis. Streak artifact from dense paravertebral venous contrast in the proximal V1 segment (such as series 14, image 234. However, the vessel becomes abnormal at the V1-V2 junction with an irregular, diminutive contour (series 15, image 156). The vessel remains abnormal along a segment of 4-5 cm, with abrupt more normal right vertebral V2 caliber attained at the C3-C4 level on series 15, image 145. Beyond that point the vessel appears fairly smooth and normally patent to the skull base. Proximal left subclavian artery appears negative. The left vertebral artery origin and V1 segment is partially obscured by dense venous contrast artifact. But the visible distal V1 and proximal V2 segment is normal. The left vertebral is codominant and within normal limits to the skull base. CTA HEAD Posterior circulation: Codominant and normal distal vertebral arteries and vertebrobasilar junction. Both PICA origins are normal. Patent basilar artery without stenosis. Patent SCA and PCA origins. Both posterior communicating arteries are present. Bilateral PCA branches are within normal limits. Anterior circulation: Both ICA siphons are patent with no plaque or stenosis. Posterior communicating artery origins are normal. Carotid termini, MCA and ACA origins are patent and normal. Anterior communicating artery and bilateral ACA branches are within normal limits. Left MCA M1 segment and bifurcation are patent without stenosis. Right MCA M1 segment and bifurcation are patent without stenosis. Bilateral MCA branches are within normal limits. Venous sinuses: Patent. Anatomic variants: None. Review of the MIP images confirms the above findings IMPRESSION: 1. 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 Moderate stenosis of the vessel results, but it remains patent and normalizes at the C3-C4 level. 2. No evidence of underlying atherosclerosis and no other arterial abnormality on CTA Head and Neck; tortuous cervical carotid arteries. 3. Normal CT appearance of the brain. Electronically Signed: By: Odessa Fleming M.D. On: 04/16/2023 05:29   MR BRAIN WO CONTRAST  Result Date: 04/16/2023 CLINICAL DATA:  Orbital trauma EXAM: MRI HEAD AND ORBITS WITHOUT CONTRAST TECHNIQUE: Multiplanar, multi-echo pulse sequences of the brain and surrounding structures were acquired without intravenous contrast. Multiplanar, multi-echo pulse sequences of the orbits and surrounding  structures were acquired including fat saturation techniques, without intravenous contrast administration. COMPARISON:  None Available. FINDINGS: MRI HEAD FINDINGS Brain: No acute ischemia. No acute or chronic hemorrhage. Normal white matter signal and CSF spaces. Midline structures are normal. Vascular: Normal flow voids. Skull and upper cervical spine: Normal marrow signal. Other: None MRI ORBITS FINDINGS Orbits: No orbital mass or evidence of inflammation. Normal appearance of the globes, optic nerve-sheath complexes, extraocular muscles, orbital fat and lacrimal glands. Visualized sinuses: Clear. Soft tissues: Normal. IMPRESSION: Normal MRI of the brain and orbits. Electronically Signed   By: Deatra Robinson M.D.   On: 04/16/2023 00:01   MR ORBITS WO CONTRAST  Result Date: 04/16/2023 CLINICAL DATA:  Orbital trauma EXAM: MRI HEAD AND ORBITS WITHOUT CONTRAST TECHNIQUE: Multiplanar, multi-echo pulse sequences of the brain and surrounding structures were acquired without intravenous contrast. Multiplanar, multi-echo pulse sequences of the orbits and surrounding structures were acquired including fat saturation techniques, without intravenous contrast administration. COMPARISON:  None Available. FINDINGS: MRI HEAD FINDINGS Brain: No acute  ischemia. No acute or chronic hemorrhage. Normal white matter signal and CSF spaces. Midline structures are normal. Vascular: Normal flow voids. Skull and upper cervical spine: Normal marrow signal. Other: None MRI ORBITS FINDINGS Orbits: No orbital mass or evidence of inflammation. Normal appearance of the globes, optic nerve-sheath complexes, extraocular muscles, orbital fat and lacrimal glands. Visualized sinuses: Clear. Soft tissues: Normal. IMPRESSION: Normal MRI of the brain and orbits. Electronically Signed   By: Deatra Robinson M.D.   On: 04/16/2023 00:01    Pending Labs Unresulted Labs (From admission, onward)     Start     Ordered   04/17/23 0500  HIV Antibody (routine testing w rflx)  (HIV Antibody (Routine testing w reflex) panel)  Tomorrow morning,   R        04/16/23 0817   04/17/23 0500  Lipid panel  (Labs)  Tomorrow morning,   R       Comments: Fasting    04/16/23 0817   04/17/23 0500  CBC  Tomorrow morning,   R        04/16/23 0817   04/17/23 0500  Comprehensive metabolic panel  Tomorrow morning,   R        04/16/23 0817            Vitals/Pain Today's Vitals   04/16/23 0843 04/16/23 1000 04/16/23 1225 04/16/23 1303  BP: 129/82 (!) 152/95  126/82  Pulse: 70 88  73  Resp: 16 18  15   Temp: 98.3 F (36.8 C)  98.6 F (37 C)   TempSrc: Oral  Oral   SpO2: 93% 95%  99%  Weight:      Height:      PainSc:        Isolation Precautions No active isolations  Medications Medications  enoxaparin (LOVENOX) injection 40 mg (has no administration in time range)   stroke: early stages of recovery book (has no administration in time range)  acetaminophen (TYLENOL) tablet 650 mg (has no administration in time range)    Or  acetaminophen (TYLENOL) suppository 650 mg (has no administration in time range)  ondansetron (ZOFRAN) tablet 4 mg (has no administration in time range)    Or  ondansetron (ZOFRAN) injection 4 mg (has no administration in time range)  0.9 %  sodium  chloride infusion ( Intravenous New Bag/Given 04/16/23 1000)  clopidogrel (PLAVIX) tablet 75 mg (has no administration in time range)  insulin aspart (novoLOG) injection 0-9 Units (1  Units Subcutaneous Given 04/16/23 1138)  methocarbamol (ROBAXIN) tablet 500 mg (500 mg Oral Given 04/16/23 0317)  diazepam (VALIUM) tablet 2 mg (2 mg Oral Given 04/16/23 0317)  iohexol (OMNIPAQUE) 350 MG/ML injection 75 mL (75 mLs Intravenous Contrast Given 04/16/23 0428)  aspirin chewable tablet 324 mg (324 mg Oral Given 04/16/23 0543)  clopidogrel (PLAVIX) tablet 300 mg (300 mg Oral Given 04/16/23 1000)    Mobility walks     Focused Assessments Neuro Assessment Handoff:  Swallow screen pass? Yes    NIH Stroke Scale  Dizziness Present: No Headache Present: No Interval: Shift assessment Level of Consciousness (1a.)   : Alert, keenly responsive LOC Questions (1b. )   : Answers both questions correctly LOC Commands (1c. )   : Performs both tasks correctly Best Gaze (2. )  : Normal Visual (3. )  : No visual loss Facial Palsy (4. )    : Normal symmetrical movements Motor Arm, Left (5a. )   : No drift Motor Arm, Right (5b. ) : No drift Motor Leg, Left (6a. )  : No drift Motor Leg, Right (6b. ) : No drift Limb Ataxia (7. ): Absent Sensory (8. )  : Normal, no sensory loss Best Language (9. )  : No aphasia Dysarthria (10. ): Normal Extinction/Inattention (11.)   : No Abnormality Complete NIHSS TOTAL: 0     Neuro Assessment: Within Defined Limits Neuro Checks:   Shift assessment (04/16/23 0843)  Has TPA been given? No If patient is a Neuro Trauma and patient is going to OR before floor call report to 4N Charge nurse: [phone] or [phone]   R Recommendations: See Admitting Provider Note  Report given to:   Additional Notes:

## 2023-04-17 ENCOUNTER — Observation Stay (HOSPITAL_BASED_OUTPATIENT_CLINIC_OR_DEPARTMENT_OTHER): Payer: PRIVATE HEALTH INSURANCE

## 2023-04-17 ENCOUNTER — Other Ambulatory Visit: Payer: Self-pay

## 2023-04-17 ENCOUNTER — Other Ambulatory Visit (HOSPITAL_COMMUNITY): Payer: Self-pay

## 2023-04-17 DIAGNOSIS — I7774 Dissection of vertebral artery: Secondary | ICD-10-CM | POA: Diagnosis not present

## 2023-04-17 DIAGNOSIS — G459 Transient cerebral ischemic attack, unspecified: Secondary | ICD-10-CM

## 2023-04-17 DIAGNOSIS — H547 Unspecified visual loss: Secondary | ICD-10-CM | POA: Diagnosis not present

## 2023-04-17 LAB — RAPID URINE DRUG SCREEN, HOSP PERFORMED
Amphetamines: NOT DETECTED
Barbiturates: NOT DETECTED
Benzodiazepines: NOT DETECTED
Cocaine: NOT DETECTED
Opiates: NOT DETECTED
Tetrahydrocannabinol: NOT DETECTED

## 2023-04-17 LAB — GLUCOSE, CAPILLARY
Glucose-Capillary: 125 mg/dL — ABNORMAL HIGH (ref 70–99)
Glucose-Capillary: 118 mg/dL — ABNORMAL HIGH (ref 70–99)

## 2023-04-17 MED ORDER — ASPIRIN 81 MG PO TBEC
81.0000 mg | DELAYED_RELEASE_TABLET | Freq: Every day | ORAL | 1 refills | Status: AC
  Filled 2023-04-17: qty 120, 120d supply, fill #0
  Filled 2023-04-29: qty 30, 30d supply, fill #1

## 2023-04-17 MED ORDER — BLOOD GLUCOSE TEST VI STRP
1.0000 | ORAL_STRIP | Freq: Three times a day (TID) | 0 refills | Status: AC
  Filled 2023-04-17: qty 50, 17d supply, fill #0
  Filled 2023-04-29: qty 50, 17d supply, fill #1

## 2023-04-17 MED ORDER — ROSUVASTATIN CALCIUM 20 MG PO TABS
20.0000 mg | ORAL_TABLET | Freq: Every day | ORAL | Status: DC
  Administered 2023-04-17: 20 mg via ORAL
  Filled 2023-04-17: qty 1

## 2023-04-17 MED ORDER — BLOOD GLUCOSE MONITOR SYSTEM W/DEVICE KIT
1.0000 | PACK | Freq: Three times a day (TID) | 0 refills | Status: AC
  Filled 2023-04-17: qty 1, 30d supply, fill #0

## 2023-04-17 MED ORDER — CLOPIDOGREL BISULFATE 75 MG PO TABS
75.0000 mg | ORAL_TABLET | Freq: Every day | ORAL | 0 refills | Status: AC
  Filled 2023-04-17: qty 89, 89d supply, fill #0

## 2023-04-17 MED ORDER — ASPIRIN 81 MG PO TBEC
81.0000 mg | DELAYED_RELEASE_TABLET | Freq: Every day | ORAL | Status: DC
  Administered 2023-04-17: 81 mg via ORAL
  Filled 2023-04-17: qty 1

## 2023-04-17 MED ORDER — EZETIMIBE 10 MG PO TABS
10.0000 mg | ORAL_TABLET | Freq: Every day | ORAL | 0 refills | Status: DC
  Filled 2023-04-17: qty 60, 60d supply, fill #0

## 2023-04-17 MED ORDER — LANCET DEVICE MISC
1.0000 | Freq: Three times a day (TID) | 0 refills | Status: AC
  Filled 2023-04-17 – 2023-04-29 (×2): qty 1, 30d supply, fill #0

## 2023-04-17 MED ORDER — ACCU-CHEK SOFTCLIX LANCETS MISC
1.0000 | Freq: Three times a day (TID) | 0 refills | Status: AC
  Filled 2023-04-17: qty 100, 30d supply, fill #0

## 2023-04-17 MED ORDER — EZETIMIBE 10 MG PO TABS: 10.0000 mg | ORAL_TABLET | Freq: Every day | ORAL | Status: DC

## 2023-04-17 NOTE — Evaluation (Signed)
Physical Therapy Evaluation Patient Details Name: Virginia Mcdonald MRN: [id number] DOB: 1976-02-20 Today's Date: 04/17/2023  History of Present Illness  Pt is a 47 y.o. female admitted 8/7 with c/o visual changes. MRI negative. CT revealed R vertebral artery dissection. PMH: DM   Clinical Impression  PT eval complete. Pt is independent with all functional mobility. BLE strength symmetrical. Balance intact. No further skilled PT intervention indicated. PT signing off.         If plan is discharge home, recommend the following:     Can travel by private vehicle        Equipment Recommendations None recommended by PT  Recommendations for Other Services       Functional Status Assessment Patient has not had a recent decline in their functional status     Precautions / Restrictions Precautions Precautions: None      Mobility  Bed Mobility Overal bed mobility: Independent                  Transfers Overall transfer level: Independent Equipment used: None                    Ambulation/Gait Ambulation/Gait assistance: Independent Gait Distance (Feet): 500 Feet Assistive device: None Gait Pattern/deviations: WFL(Within Functional Limits)   Gait velocity interpretation: >4.37 ft/sec, indicative of normal walking speed      Stairs            Wheelchair Mobility     Tilt Bed    Modified Rankin (Stroke Patients Only)       Balance Overall balance assessment: No apparent balance deficits (not formally assessed)                                           Pertinent Vitals/Pain Pain Assessment Pain Assessment: Faces Faces Pain Scale: Hurts little more Pain Location: R neck/shoulder Pain Descriptors / Indicators: Discomfort, Sore, Tightness Pain Intervention(s): Monitored during session    Home Living Family/patient expects to be discharged to:: Private residence Living Arrangements: Spouse/significant  other;Children Available Help at Discharge: Family;Available 24 hours/day Type of Home: House Home Access: Level entry       Home Layout: One level Home Equipment: None      Prior Function Prior Level of Function : Independent/Modified Independent;Driving                     Extremity/Trunk Assessment   Upper Extremity Assessment Upper Extremity Assessment: Defer to OT evaluation    Lower Extremity Assessment Lower Extremity Assessment: Overall WFL for tasks assessed (symmetrical)    Cervical / Trunk Assessment Cervical / Trunk Assessment: Normal  Communication   Communication Communication: No apparent difficulties  Cognition Arousal: Alert Behavior During Therapy: WFL for tasks assessed/performed Overall Cognitive Status: Within Functional Limits for tasks assessed                                          General Comments      Exercises     Assessment/Plan    PT Assessment Patient does not need any further PT services  PT Problem List         PT Treatment Interventions      PT Goals (Current goals can be found in the Care  Plan section)  Acute Rehab PT Goals Patient Stated Goal: home PT Goal Formulation: All assessment and education complete, DC therapy    Frequency       Co-evaluation               AM-PAC PT "6 Clicks" Mobility  Outcome Measure Help needed turning from your back to your side while in a flat bed without using bedrails?: None Help needed moving from lying on your back to sitting on the side of a flat bed without using bedrails?: None Help needed moving to and from a bed to a chair (including a wheelchair)?: None Help needed standing up from a chair using your arms (e.g., wheelchair or bedside chair)?: None Help needed to walk in hospital room?: None Help needed climbing 3-5 steps with a railing? : None 6 Click Score: 24    End of Session   Activity Tolerance: Patient tolerated treatment  well Patient left: in bed;with call bell/phone within reach Nurse Communication: Mobility status PT Visit Diagnosis: Pain;Difficulty in walking, not elsewhere classified (R26.2) Pain - Right/Left: Right Pain - part of body: Shoulder    Time: 2595-6387 PT Time Calculation (min) (ACUTE ONLY): 20 min   Charges:   PT Evaluation $PT Eval Low Complexity: 1 Low   PT General Charges $$ ACUTE PT VISIT: 1 Visit         Ferd Glassing., PT  Office # [phone]   Ilda Foil 04/17/2023, 10:01 AM

## 2023-04-17 NOTE — Evaluation (Signed)
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   Type 2 diabetes mellitus without complication, without long-term current use of insulin (HCC) 02/16/2023   Uncontrolled type 2 diabetes mellitus with hyperglycemia (HCC) 10/15/2021   Pure hypercholesterolemia 10/15/2021   Vaginal dryness 10/15/2021   BMI 22.0-22.9, adult 10/15/2021   Fibroids 10/23/2020   S/P hysterectomy 10/23/2020   Newly diagnosed diabetes (HCC) 07/06/2018   Past Medical History:  Past Medical History:  Diagnosis Date   Diet-controlled diabetes mellitus (HCC)    type 2   Fibroids    Past Surgical History:  Past Surgical History:  Procedure Laterality Date   CESAREAN SECTION  2003   ROBOTIC ASSISTED LAPAROSCOPIC HYSTERECTOMY AND SALPINGECTOMY Bilateral 10/23/2020   Procedure: XI ROBOTIC ASSISTED LAPAROSCOPIC HYSTERECTOMY AND BILATERAL SALPINGECTOMY;  Surgeon: Gerald Leitz, MD;  Location: [facility] [place];  Service: Gynecology;  Laterality: Bilateral;   HPI:  Pt is a 47 yo female presenting 8/8 with R neck pain, stiffness, and vision changes. Saw eye MD, diagnosed with "DM in eye", Positive for abrupt, highly irregular proximal right vertebral artery (distal V1 and proximal V2 segments extending for 4-5 cm) most compatible with Acute Right Vertebral Artery Dissection. Moderate stenosis of the vessel results, but it remains patent and normalizes at the C3-C4 level. MRI Brain and CTH negative.PMH includes diet-controlled T2DM, fibroids   Assessment / Plan / Recommendation Clinical Impression  Pt reports living at home with her husband and daughter, where she handles all finances and medications independently. She  reports no acute concerns with speech or cognition. Pt scored 20/30 on the SLUMS (a score of 27 or above is considered WFL) characterized by difficulties primarily with attention, although with noted deficits in memory, awareness, and problem solving. Recommend OP ST intervention to ensure pt can return to PLOF and can perform complex cognitive tasks independently.    SLP Assessment  SLP Recommendation/Assessment: All further Speech Lanaguage Pathology  needs can be addressed in the next venue of care SLP Visit Diagnosis: Cognitive communication deficit (R41.841)    Recommendations for follow up therapy are one component of a multi-disciplinary discharge planning process, led by the attending physician.  Recommendations may be updated based on patient status, additional functional criteria and insurance authorization.    Follow Up Recommendations  Outpatient SLP    Assistance Recommended at Discharge  PRN  Functional Status Assessment Patient has had a recent decline in their functional status and demonstrates the ability to make significant improvements in function in a reasonable and predictable amount of time.  Frequency and Duration           SLP Evaluation Cognition  Overall Cognitive Status: Impaired/Different from baseline Arousal/Alertness: Awake/alert Orientation Level: Oriented X4 Attention: Sustained Sustained Attention: Impaired Sustained Attention Impairment: Verbal basic Memory: Impaired Memory Impairment: Retrieval deficit Awareness: Impaired Awareness Impairment: Intellectual impairment Problem Solving: Impaired Problem Solving Impairment: Verbal basic       Comprehension  Auditory Comprehension Overall Auditory Comprehension: Appears within functional limits for tasks assessed    Expression Expression Primary Mode of Expression: Verbal Verbal Expression Overall Verbal Expression: Appears within functional limits for tasks assessed Written  Expression Dominant Hand: Right   Oral / Motor  Oral Motor/Sensory Function Overall Oral Motor/Sensory  Function: Within functional limits Motor Speech Overall Motor Speech: Appears within functional limits for tasks assessed            Gwynneth Aliment, M.A., CF-SLP Speech Language Pathology, Acute Rehabilitation Services  Secure Chat preferred [phone]  04/17/2023, 2:33 PM

## 2023-04-17 NOTE — Progress Notes (Signed)
Echocardiogram 2D Echocardiogram has been performed.  Virginia Mcdonald 04/17/2023, 11:53 AM

## 2023-04-17 NOTE — Progress Notes (Addendum)
STROKE TEAM PROGRESS NOTE   BRIEF HPI Virginia Mcdonald is a 47 y.o. female with type 2 diabetes who presented 8/7 with right homonymous hemianopsia that lasted about 20 minutes 8/6. She experienced sudden loss of vision followed by blurry vision. She denied any focal weakness, dysarthria, dysphagia, gait impairment, fevers, chills, chest pain, SOB, abd pain, n/v/d, or any other symptoms. Patient went to eye doctor before coming to ED, they stated that she had diabetic retinopathy.   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 Loaded with ASA and Plavix.   INTERIM HISTORY/SUBJECTIVE Stroke workup continuing.  Patient denies any vision changes. Continues to endorse right-sided neck and shoulder pain, chronic.   OBJECTIVE  CBC    Component Value Date/Time   WBC 4.8 04/16/2023 2358   RBC 4.61 04/16/2023 2358   HGB 13.1 04/16/2023 2358   HGB 14.1 12/23/2022 1552   HCT 40.4 04/16/2023 2358   HCT 43.6 12/23/2022 1552   PLT 152 04/16/2023 2358   PLT 162 12/23/2022 1552   MCV 87.6 04/16/2023 2358   MCV 90 12/23/2022 1552   MCH 28.4 04/16/2023 2358   MCHC 32.4 04/16/2023 2358   RDW 14.7 04/16/2023 2358   RDW 13.6 12/23/2022 1552    BMET    Component Value Date/Time   NA 138 04/16/2023 2358   NA 141 12/23/2022 1552   K 3.7 04/16/2023 2358   CL 105 04/16/2023 2358   CO2 24 04/16/2023 2358   GLUCOSE 125 (H) 04/16/2023 2358   BUN 16 04/16/2023 2358   BUN 13 12/23/2022 1552   CREATININE 0.87 04/16/2023 2358   CALCIUM 9.0 04/16/2023 2358   EGFR 65 12/23/2022 1552   GFRNONAA >60 04/16/2023 2358    IMAGING past 24 hours No results found.  Vitals:   04/16/23 1438 04/16/23 1933 04/16/23 2331 04/17/23 0325  BP: 117/74 109/66 104/69 120/74  Pulse: 83 65 63 76  Resp: 16 18 18 18   Temp: 99.3 F (37.4 C) 98.1 F (36.7 C) 98 F (36.7 C) 98 F (36.7 C)  TempSrc: Oral Oral    SpO2: 100% 100% 100% 98%  Weight:      Height:          PHYSICAL EXAM General:  Alert, well-nourished, well-developed patient in no acute distress Psych:  Mood and affect appropriate for situation CV: Regular rate and rhythm on monitor Respiratory:  Regular, unlabored respirations on room air GI: Abdomen soft and nontender   NEURO:  Mental Status: AA&Ox3, patient is able to give clear and coherent history Speech/Language: speech is without dysarthria or aphasia.  Naming, repetition, fluency, and comprehension intact.  Cranial Nerves:  II: PERRL. Visual fields full.  III, IV, VI: EOMI. Eyelids elevate symmetrically.  V: Sensation is intact to light touch and symmetrical to face.  VII: Face is symmetrical resting and smiling VIII: hearing intact to voice. IX, X: Palate elevates symmetrically. Phonation is normal.  ZO:[id number] shrug 5/5. XII: tongue is midline without fasciculations. Motor: 5/5 strength to all muscle groups tested.  Tone: is normal and bulk is normal Sensation- Intact to light touch bilaterally. Extinction absent to light touch to DSS.   Coordination: FTN intact bilaterally, HKS: no ataxia in BLE.No drift.  Gait- deferred   ASSESSMENT/PLAN  ? Complicated migraine vs. Amaurosis fugux Right shoulder and neck pain before and after R eye vision loss CT head No acute abnormality.  CTA head & neck  Abrupt, highly irregular proximal right vertebral artery,  most compatible with acute right vertebral artery dissection MRI  brain and orbits: Negative 2D Echo: EF 60-65%, no PFO LDL 125 HgbA1c 7.3 VTE prophylaxis - lovenox No antithrombotic prior to admission, now on aspirin 81 mg daily and clopidogrel 75 mg daily for 3 weeks and then aspirin alone. 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  Avoid hypotension  Hyperlipidemia Home meds:  none LDL 125, goal < 70 Put on zetia 10 Continue zetia at discharge. Patient states she has had muscle cramps with statin use in the past. Will discuss possibility of Leqvio with her.   Diabetes type II Uncontrolled Diabetic retinopathy  Home meds:  glipizide, farxiga HgbA1c 7.3, goal < 7.0 CBGs SSI Recommend close follow-up with PCP for better DM control   Hospital day # 0   Pt seen by Neuro NP/APP and later by MD. Note/plan to be edited by MD as needed.    Lynnae January, DNP, AGACNP-BC [facility] Please use AMION for contact information & EPIC for messaging.  ATTENDING NOTE: I reviewed above note and agree with the assessment and plan. Pt was seen and examined.   No family at the bedside. Pt reclining in bed, still complains of right shoulder and neck pain but improved. It could be related to right VA dissection but can not explain pt right eye transient vision loss. Etiology for vision loss, could be complicated migraine vs. Ophthalmic atherosclerosis. Continue DAPT and zetia. Pt not tolerating statins. Will try to see if she qualifies for [facility]. Continue follow up with ophthalmology. PT and OT no recs  For detailed assessment and plan, please refer to above/below as I have made changes wherever appropriate.   Neurology will sign off. Please call with questions. Pt will follow up with stroke clinic NP at [facility] in about 4 weeks. Thanks for the consult.   Marvel Plan, MD PhD Stroke Neurology 04/17/2023 3:34 PM    To contact Stroke Continuity provider, please refer to [email or web address]. After hours, contact General Neurology

## 2023-04-17 NOTE — TOC Transition Note (Signed)
Transition of Care [facility]) - CM/SW Discharge Note   Patient Details  Name: Virginia Mcdonald MRN: [id number] Date of Birth: [birth year]  [facility] CM/SW Contact:  Leone Haven, RN Phone Number: 04/17/2023, 1:49 PM   Clinical Narrative:    Patient is for dc today, she asked to be scheduled with follow with Dr. Sherron Flemings, it is on AVS.  Spouse will take her home. TOC to fill meds.   Final next level of care: Home/Self Care Barriers to Discharge: No Barriers Identified   Patient Goals and CMS Choice   Choice offered to / list presented to : NA  Discharge Placement                         Discharge Plan and Services Additional resources added to the After Visit Summary for   In-house Referral: NA Discharge Planning Services: CM Consult Post Acute Care Choice: NA          DME Arranged: N/A DME Agency: NA       HH Arranged: NA          Social Determinants of Health (SDOH) Interventions SDOH Screenings   Food Insecurity: No Food Insecurity (04/16/2023)  Housing: Low Risk  (04/16/2023)  Transportation Needs: No Transportation Needs (04/16/2023)  Utilities: Not At Risk (04/16/2023)  Depression (PHQ2-9): Low Risk  (12/23/2022)  Tobacco Use: Low Risk  (04/15/2023)     Readmission Risk Interventions     No data to display

## 2023-04-17 NOTE — Progress Notes (Signed)
OT Cancellation Note  Patient Details Name: Graisyn Lemaster MRN: [id number] DOB: Nov 16, 1975   Cancelled Treatment:    Reason Eval/Treat Not Completed: OT screened, no needs identified, will sign off Pt reports vision back to baseline and noted pt independent with PT earlier today. No formal OT eval needed at this time.  Lorre Munroe 04/17/2023, 12:43 PM

## 2023-04-17 NOTE — Discharge Summary (Signed)
Physician Discharge Summary  Virginia Mcdonald ZOX:[id number] DOB: [birth year] DOA: 04/15/2023  PCP: Dorothyann Peng, MD  Admit date: 04/15/2023 Discharge date: 04/17/2023  Admitted From: Home Disposition:  Home  Discharge Condition:Stable CODE STATUS:FULL Diet recommendation:  Carb Modified   Brief/Interim Summary: Patient is a 47 y.o. female with medical history significant of diet-controlled type 2 diabetes, fibroids who presented to the emergency department with complaints of right eye than right sided neck pain ,some vision changes in her right eye that lasted for about 20 minutes where she had a sudden vision loss followed by colors to light and blurry vision and worsening her cervical pain .MRI of the brain without contrast with no acute ischemia. No acute or chronic hemorrhagic. Normal white matter signal and CSF spaces. MRI of the orbits was normal. CTA head and neck was significant for acute right vertebral artery dissection.  Neurology consulted.  Stroke workup initiated and completed.  PT did not recommend follow-up.  Medically stable for discharge home today.  Following problems were addressed during the hospitalization:   Vertebral artery dissection Imaging as above.  Her right sided blurry vision has resolved.  LDL of 125.  A1c of 7.3.  Echo showed EF of 60 to 60%, no atrial level shunt.  Neurology cleared for discharge and recommend aspirin Plavix for 3 months followed by aspirin alone.  Hyperlipidemia: Patient declined to be on a statin.  Started on Zetia.   Type 2 diabetes mellitus:  A1c of 7.3.  Takes glipizide,farxiga  at home.  Will recommend to continue the same and closely follow-up with PCP for the management of her diabetes   discharge Diagnoses:  Principal Problem:   Vision loss Active Problems:   Pure hypercholesterolemia   Type 2 diabetes mellitus without complication, without long-term current use of insulin (HCC)   Vertebral artery dissection  [facility])    Discharge Instructions  Discharge Instructions     Diet Carb Modified   Complete by: As directed    Discharge instructions   Complete by: As directed    1)Please take prescribed medications as instructed 2)Follow up with neurology as an outpatient.  Name and number the provider group has been attached 3)Follow up with your PCP in a week   Increase activity slowly   Complete by: As directed       Allergies as of 04/17/2023       Reactions   Penicillins Anaphylaxis   Claritin-d 24 Hour [loratadine-pseudoephedrine Er] Other (See Comments)   Pt reports she ran a fever, became very fatigue.    Procardia [nifedipine] Palpitations        Medication List     TAKE these medications    Accu-Chek FastClix Lancets Misc Use as directed to check blood sugars 1 time per day dx: e11.65   Accu-Chek Guide test strip Generic drug: glucose blood Use as instructed to check blood sugars 1 time per day dx: e11.65   aspirin EC 81 MG tablet Take 1 tablet (81 mg total) by mouth daily. Swallow whole. Start taking on: April 18, 2023   clopidogrel 75 MG tablet Commonly known as: PLAVIX Take 1 tablet (75 mg total) by mouth daily. Start taking on: April 18, 2023   dapagliflozin propanediol 10 MG Tabs tablet Commonly known as: Marcelline Deist Take 1 tablet (10 mg total) by mouth daily before breakfast.   ezetimibe 10 MG tablet Commonly known as: ZETIA Take 1 tablet (10 mg total) by mouth daily.   FreeStyle [employer] 3 Sensor Misc  USE TO CHECK BLOOD SUGAR THREE TIMES DAILY   glipiZIDE 10 MG 24 hr tablet Commonly known as: GLUCOTROL XL Take 1 tablet by mouth daily.        Follow-up Information     Dorothyann Peng, MD. Schedule an appointment as soon as possible for a visit in 1 week(s).   Specialty: Internal Medicine Contact information: [address] [place] [address] [phone]         [facility]. Schedule an  appointment as soon as possible for a visit in 4 week(s).   Specialty: Neurology Contact information: [address] [place] [address] [phone]               Allergies  Allergen Reactions   Penicillins Anaphylaxis   Claritin-D 24 Hour [Loratadine-Pseudoephedrine Er] Other (See Comments)    Pt reports she ran a fever, became very fatigue.    Procardia [Nifedipine] Palpitations    Consultations: Neurology   Procedures/Studies: ECHOCARDIOGRAM COMPLETE BUBBLE STUDY  Result Date: 04/17/2023    ECHOCARDIOGRAM REPORT   Patient Name:   Virginia Mcdonald Date of Exam: 04/17/2023 Medical Rec #:  [id number]             Height:       63.0 in Accession #:    [id number]            Weight:       134.0 lb Date of Birth:  Aug 05, 1976             BSA:          1.631 m Patient Age:    46 years              BP:           120/74 mmHg Patient Gender: F                     HR:           66 bpm. Exam Location:  Inpatient Procedure: 2D Echo, Cardiac Doppler, Color Doppler and Saline Contrast Bubble            Study Indications:    TIA 435.9/G45.9  History:        Patient has no prior history of Echocardiogram examinations.                 Risk Factors:Diabetes.  Sonographer:    Lucendia Herrlich Referring Phys: [id number] DAVID MANUEL ORTIZ IMPRESSIONS  1. Left ventricular ejection fraction, by estimation, is 60 to 65%. The left ventricle has normal function. The left ventricle has no regional wall motion abnormalities. Left ventricular diastolic parameters were normal.  2. Right ventricular systolic function is normal. The right ventricular size is normal. There is normal pulmonary artery systolic pressure. The estimated right ventricular systolic pressure is 11.4 mmHg.  3. The mitral valve is grossly normal. Trivial mitral valve regurgitation. No evidence of mitral stenosis.  4. The aortic valve is tricuspid. Aortic valve regurgitation is not visualized. No aortic stenosis is present.   5. The inferior vena cava is normal in size with greater than 50% respiratory variability, suggesting right atrial pressure of 3 mmHg.  6. Agitated saline contrast bubble study was negative, with no evidence of any interatrial shunt. Conclusion(s)/Recommendation(s): Normal biventricular function without evidence of hemodynamically significant valvular heart disease. FINDINGS  Left Ventricle: Left ventricular ejection fraction, by estimation, is 60 to 65%. The left ventricle has normal  function. The left ventricle has no regional wall motion abnormalities. The left ventricular internal cavity size was normal in size. There is  no left ventricular hypertrophy. Left ventricular diastolic parameters were normal. Right Ventricle: The right ventricular size is normal. No increase in right ventricular wall thickness. Right ventricular systolic function is normal. There is normal pulmonary artery systolic pressure. The tricuspid regurgitant velocity is 1.45 m/s, and  with an assumed right atrial pressure of 3 mmHg, the estimated right ventricular systolic pressure is 11.4 mmHg. Left [facility]: Left atrial size was normal in size. Right [facility]: Right atrial size was normal in size. Pericardium: There is no evidence of pericardial effusion. Mitral Valve: The mitral valve is grossly normal. Trivial mitral valve regurgitation. No evidence of mitral valve stenosis. Tricuspid Valve: The tricuspid valve is grossly normal. Tricuspid valve regurgitation is trivial. No evidence of tricuspid stenosis. Aortic Valve: The aortic valve is tricuspid. Aortic valve regurgitation is not visualized. No aortic stenosis is present. Aortic valve peak gradient measures 3.5 mmHg. Pulmonic Valve: The pulmonic valve was grossly normal. Pulmonic valve regurgitation is not visualized. No evidence of pulmonic stenosis. Aorta: The aortic root and ascending aorta are structurally normal, with no evidence of dilitation. Venous: The inferior vena cava is  normal in size with greater than 50% respiratory variability, suggesting right atrial pressure of 3 mmHg. IAS/Shunts: The atrial septum is grossly normal. Agitated saline contrast was given intravenously to evaluate for intracardiac shunting. Agitated saline contrast bubble study was negative, with no evidence of any interatrial shunt.  LEFT VENTRICLE PLAX 2D LVIDd:         4.10 cm   Diastology LVIDs:         2.60 cm   LV e' medial:    8.86 cm/s LV PW:         0.80 cm   LV E/e' medial:  7.8 LV IVS:        0.70 cm   LV e' lateral:   6.39 cm/s LVOT diam:     2.00 cm   LV E/e' lateral: 10.8 LV SV:         45 LV SV Index:   28 LVOT Area:     3.14 cm  IVC IVC diam: 1.40 cm LEFT ATRIUM             Index        RIGHT ATRIUM           Index LA diam:        3.00 cm 1.84 cm/m   RA Area:     13.80 cm LA Vol (A2C):   30.5 ml 18.70 ml/m  RA Volume:   32.40 ml  19.86 ml/m LA Vol (A4C):   30.8 ml 18.88 ml/m LA Biplane Vol: 33.6 ml 20.60 ml/m  AORTIC VALVE AV Area (Vmax): 2.46 cm AV Vmax:        93.70 cm/s AV Peak Grad:   3.5 mmHg LVOT Vmax:      73.30 cm/s LVOT Vmean:     45.800 cm/s LVOT VTI:       0.143 m  AORTA Ao Root diam: 2.90 cm Ao Asc diam:  2.80 cm MITRAL VALVE               TRICUSPID VALVE MV Area (PHT): 4.31 cm    TR Peak grad:   8.4 mmHg MV Decel Time: 176 msec    TR Vmax:        145.00 cm/s MV  E velocity: 68.80 cm/s MV A velocity: 82.40 cm/s  SHUNTS MV E/A ratio:  0.83        Systemic VTI:  0.14 m                            Systemic Diam: 2.00 cm Lennie Odor MD Electronically signed by Lennie Odor MD Signature Date/Time: 04/17/2023/12:47:18 PM    Final    CT ANGIO HEAD NECK W WO CM  Addendum Date: 04/16/2023   ADDENDUM REPORT: 04/16/2023 06:01 ADDENDUM: Study discussed by telephone with Dr. Glynn Octave on 04/16/2023 at 0531 hours. Electronically Signed   By: Odessa Fleming M.D.   On: 04/16/2023 06:01   Result Date: 04/16/2023 CLINICAL DATA:  47 year old female with neck pain and stiffness. Vision changes.  EXAM: CT ANGIOGRAPHY HEAD AND NECK WITH AND WITHOUT CONTRAST TECHNIQUE: Multidetector CT imaging of the head and neck was performed using the standard protocol during bolus administration of intravenous contrast. Multiplanar CT image reconstructions and MIPs were obtained to evaluate the vascular anatomy. Carotid stenosis measurements (when applicable) are obtained utilizing NASCET criteria, using the distal internal carotid diameter as the denominator. RADIATION DOSE REDUCTION: This exam was performed according to the departmental dose-optimization program which includes automated exposure control, adjustment of the mA and/or kV according to patient size and/or use of iterative reconstruction technique. CONTRAST:  75mL OMNIPAQUE IOHEXOL 350 MG/ML SOLN COMPARISON:  Brain and orbit MRI without 2327 hours yesterday. FINDINGS: CT HEAD Brain: Normal cerebral volume. No midline shift, ventriculomegaly, mass effect, evidence of mass lesion, intracranial hemorrhage or evidence of cortically based acute infarction. Gray-white matter differentiation is within normal limits throughout the brain. Calvarium and skull base: Negative. Paranasal sinuses: Small right ethmoid osteoma (normal variant). Otherwise visualized paranasal sinuses and mastoids are clear. Orbits: Visualized orbits and scalp soft tissues are within normal limits. CTA NECK Skeleton: Negative. Upper chest: Negative. Other neck: Dental streak artifact.  Otherwise negative. Aortic arch: Normal 3 vessel arch. Right carotid system: Mild tortuosity.  No plaque or stenosis. Left carotid system: Similar mild to moderate tortuosity. No plaque or stenosis. Vertebral arteries: Mildly tortuous proximal right subclavian artery and right vertebral artery origin with no plaque or stenosis. Streak artifact from dense paravertebral venous contrast in the proximal V1 segment (such as series 14, image 234. However, the vessel becomes abnormal at the V1-V2 junction with an  irregular, diminutive contour (series 15, image 156). The vessel remains abnormal along a segment of 4-5 cm, with abrupt more normal right vertebral V2 caliber attained at the C3-C4 level on series 15, image 145. Beyond that point the vessel appears fairly smooth and normally patent to the skull base. Proximal left subclavian artery appears negative. The left vertebral artery origin and V1 segment is partially obscured by dense venous contrast artifact. But the visible distal V1 and proximal V2 segment is normal. The left vertebral is codominant and within normal limits to the skull base. CTA HEAD Posterior circulation: Codominant and normal distal vertebral arteries and vertebrobasilar junction. Both PICA origins are normal. Patent basilar artery without stenosis. Patent SCA and PCA origins. Both posterior communicating arteries are present. Bilateral PCA branches are within normal limits. Anterior circulation: Both ICA siphons are patent with no plaque or stenosis. Posterior communicating artery origins are normal. Carotid termini, MCA and ACA origins are patent and normal. Anterior communicating artery and bilateral ACA branches are within normal limits. Left MCA M1 segment and bifurcation are patent  without stenosis. Right MCA M1 segment and bifurcation are patent without stenosis. Bilateral MCA branches are within normal limits. Venous sinuses: Patent. Anatomic variants: None. Review of the MIP images confirms the above findings IMPRESSION: 1. 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 Moderate stenosis of the vessel results, but it remains patent and normalizes at the C3-C4 level. 2. No evidence of underlying atherosclerosis and no other arterial abnormality on CTA Head and Neck; tortuous cervical carotid arteries. 3. Normal CT appearance of the brain.  Electronically Signed: By: Odessa Fleming M.D. On: 04/16/2023 05:29   MR BRAIN WO CONTRAST  Result Date: 04/16/2023 CLINICAL DATA:  Orbital trauma EXAM: MRI HEAD AND ORBITS WITHOUT CONTRAST TECHNIQUE: Multiplanar, multi-echo pulse sequences of the brain and surrounding structures were acquired without intravenous contrast. Multiplanar, multi-echo pulse sequences of the orbits and surrounding structures were acquired including fat saturation techniques, without intravenous contrast administration. COMPARISON:  None Available. FINDINGS: MRI HEAD FINDINGS Brain: No acute ischemia. No acute or chronic hemorrhage. Normal white matter signal and CSF spaces. Midline structures are normal. Vascular: Normal flow voids. Skull and upper cervical spine: Normal marrow signal. Other: None MRI ORBITS FINDINGS Orbits: No orbital mass or evidence of inflammation. Normal appearance of the globes, optic nerve-sheath complexes, extraocular muscles, orbital fat and lacrimal glands. Visualized sinuses: Clear. Soft tissues: Normal. IMPRESSION: Normal MRI of the brain and orbits. Electronically Signed   By: Deatra Robinson M.D.   On: 04/16/2023 00:01   MR ORBITS WO CONTRAST  Result Date: 04/16/2023 CLINICAL DATA:  Orbital trauma EXAM: MRI HEAD AND ORBITS WITHOUT CONTRAST TECHNIQUE: Multiplanar, multi-echo pulse sequences of the brain and surrounding structures were acquired without intravenous contrast. Multiplanar, multi-echo pulse sequences of the orbits and surrounding structures were acquired including fat saturation techniques, without intravenous contrast administration. COMPARISON:  None Available. FINDINGS: MRI HEAD FINDINGS Brain: No acute ischemia. No acute or chronic hemorrhage. Normal white matter signal and CSF spaces. Midline structures are normal. Vascular: Normal flow voids. Skull and upper cervical spine: Normal marrow signal. Other: None MRI ORBITS FINDINGS Orbits: No orbital mass or evidence of inflammation. Normal  appearance of the globes, optic nerve-sheath complexes, extraocular muscles, orbital fat and lacrimal glands. Visualized sinuses: Clear. Soft tissues: Normal. IMPRESSION: Normal MRI of the brain and orbits. Electronically Signed   By: Deatra Robinson M.D.   On: 04/16/2023 00:01      Subjective: Patient seen and examined at bedside today.  Hemodynamically stable.  Comfortable.  She worked with the physical therapy.  She does not have any blurry vision on the right side today.  Medically stable for discharge.  Discharge Exam: Vitals:   04/17/23 0843 04/17/23 1251  BP: 114/81 128/85  Pulse: 71 66  Resp: 19 19  Temp: 98.3 F (36.8 C) 98.9 F (37.2 C)  SpO2: 99% 100%   Vitals:   04/16/23 2331 04/17/23 0325 04/17/23 0843 04/17/23 1251  BP: 104/69 120/74 114/81 128/85  Pulse: 63 76 71 66  Resp: 18 18 19 19   Temp: 98 F (36.7 C) 98 F (36.7 C) 98.3 F (36.8 C) 98.9 F (37.2 C)  TempSrc:   Oral Oral  SpO2: 100% 98% 99% 100%  Weight:      Height:        General: Pt is alert, awake, not in acute distress Cardiovascular: RRR, S1/S2 +, no rubs, no gallops Respiratory: CTA  bilaterally, no wheezing, no rhonchi Abdominal: Soft, NT, ND, bowel sounds + Extremities: no edema, no cyanosis    The results of significant diagnostics from this hospitalization (including imaging, microbiology, ancillary and laboratory) are listed below for reference.     Microbiology: No results found for this or any previous visit (from the past 240 hour(s)).   Labs: BNP (last 3 results) No results for input(s): "BNP" in the last 8760 hours. Basic Metabolic Panel: Recent Labs  Lab 04/15/23 2151 04/16/23 2358  NA 138 138  K 3.9 3.7  CL 103 105  CO2 27 24  GLUCOSE 142* 125*  BUN 21* 16  CREATININE 0.77 0.87  CALCIUM 9.5 9.0   Liver Function Tests: Recent Labs  Lab 04/16/23 2358  AST 15  ALT 16  ALKPHOS 73  BILITOT 0.2*  PROT 6.2*  ALBUMIN 3.1*   No results for input(s): "LIPASE",  "AMYLASE" in the last 168 hours. No results for input(s): "AMMONIA" in the last 168 hours. CBC: Recent Labs  Lab 04/15/23 2151 04/16/23 2358  WBC 5.4 4.8  HGB 13.3 13.1  HCT 41.5 40.4  MCV 89.2 87.6  PLT 160 152   Cardiac Enzymes: No results for input(s): "CKTOTAL", "CKMB", "CKMBINDEX", "TROPONINI" in the last 168 hours. BNP: Invalid input(s): "POCBNP" CBG: Recent Labs  Lab 04/16/23 1120 04/16/23 1756 04/16/23 2117 04/17/23 0628 04/17/23 1254  GLUCAP 129* 106* 159* 125* 118*   D-Dimer No results for input(s): "DDIMER" in the last 72 hours. Hgb A1c Recent Labs    04/15/23 2151  HGBA1C 7.3*   Lipid Profile Recent Labs    04/16/23 2358  CHOL 197  HDL 54  LDLCALC 125*  TRIG 88  CHOLHDL 3.6   Thyroid function studies No results for input(s): "TSH", "T4TOTAL", "T3FREE", "THYROIDAB" in the last 72 hours.  Invalid input(s): "FREET3" Anemia work up No results for input(s): "VITAMINB12", "FOLATE", "FERRITIN", "TIBC", "IRON", "RETICCTPCT" in the last 72 hours. Urinalysis    Component Value Date/Time   BILIRUBINUR Negative 10/15/2021 0855   PROTEINUR Negative 10/15/2021 0855   UROBILINOGEN 0.2 10/15/2021 0855   NITRITE Negative 10/15/2021 0855   LEUKOCYTESUR Negative 10/15/2021 0855   Sepsis Labs Recent Labs  Lab 04/15/23 2151 04/16/23 2358  WBC 5.4 4.8   Microbiology No results found for this or any previous visit (from the past 240 hour(s)).  Please note: You were cared for by a hospitalist during your hospital stay. Once you are discharged, your primary care physician will handle any further medical issues. Please note that NO REFILLS for any discharge medications will be authorized once you are discharged, as it is imperative that you return to your primary care physician (or establish a relationship with a primary care physician if you do not have one) for your post hospital discharge needs so that they can reassess your need for medications and monitor  your lab values.    Time coordinating discharge: 40 minutes  SIGNED:   Burnadette Pop, MD  [facility] 04/17/2023, 1:17 PM Pager [phone]  If 7PM-7AM, please contact night-coverage [email or web address] Password [email or web address]

## 2023-04-17 NOTE — TOC Initial Note (Signed)
Transition of Care [facility]) - Initial/Assessment Note    Patient Details  Name: Virginia Mcdonald MRN: [id number] Date of Birth: 12-29-1975  [facility]) CM/SW Contact:    Leone Haven, RN Phone Number: 04/17/2023, 1:44 PM  Clinical Narrative:                 From home with spouse, and daughter, pta self ambulatory, she states she has PCP that she is seeing now, Dr. Sherron Flemings, would like apt made with him at [facility].  She states she was seeing doctor Allyne Gee and she plans to go back to her but not at the moment.  She has no currently HH services in place at this time or DME at home. Her spouse will transport her home today. Her family is her support system. , she gets her medications from [facility] on [address]  TOC  to fill meds.   Expected Discharge Plan: Home/Self Care Barriers to Discharge: No Barriers Identified   Patient Goals and CMS Choice Patient states their goals for this hospitalization and ongoing recovery are:: return home   Choice offered to / list presented to : NA      Expected Discharge Plan and Services In-house Referral: NA Discharge Planning Services: CM Consult Post Acute Care Choice: NA Living arrangements for the past 2 months: Single Family Home Expected Discharge Date: 04/17/23               DME Arranged: N/A DME Agency: NA       HH Arranged: NA          Prior Living Arrangements/Services Living arrangements for the past 2 months: Single Family Home Lives with:: Spouse Patient language and need for interpreter reviewed:: Yes Do you feel safe going back to the place where you live?: Yes      Need for Family Participation in Patient Care: Yes (Comment) Care giver support system in place?: Yes (comment)   Criminal Activity/Legal Involvement Pertinent to Current Situation/Hospitalization: No - Comment as needed  Activities of Daily Living Home Assistive Devices/Equipment: None ADL Screening (condition at time of  admission) Patient's cognitive ability adequate to safely complete daily activities?: Yes Is the patient deaf or have difficulty hearing?: No Does the patient have difficulty seeing, even when wearing glasses/contacts?: Yes Does the patient have difficulty concentrating, remembering, or making decisions?: No Patient able to express need for assistance with ADLs?: Yes Does the patient have difficulty dressing or bathing?: No Independently performs ADLs?: Yes (appropriate for developmental age) Does the patient have difficulty walking or climbing stairs?: No Weakness of Legs: None Weakness of Arms/Hands: None  Permission Sought/Granted                  Emotional Assessment   Attitude/Demeanor/Rapport: Engaged Affect (typically observed): Appropriate Orientation: : Oriented to Self, Oriented to Place, Oriented to  Time, Oriented to Situation Alcohol / Substance Use: Not Applicable Psych Involvement: No (comment)  Admission diagnosis:  Neck pain [M54.2] Vision loss [H54.7] Loss of vision [H54.7] Vertebral artery dissection (HCC) [I77.74] Patient Active Problem List   Diagnosis Date Noted   Vision loss 04/16/2023   Vertebral artery dissection (HCC) 04/16/2023   Type 2 diabetes mellitus without complication, without long-term current use of insulin (HCC) 02/16/2023   Uncontrolled type 2 diabetes mellitus with hyperglycemia (HCC) 10/15/2021   Pure hypercholesterolemia 10/15/2021   Vaginal dryness 10/15/2021   BMI 22.0-22.9, adult 10/15/2021   Fibroids 10/23/2020   S/P hysterectomy 10/23/2020  Newly diagnosed diabetes (HCC) 07/06/2018   PCP:  Dorothyann Peng, MD Pharmacy:   [facility] [address] [place] - [address] [place] [address] Phone: [phone] Fax: [phone]  [facility] [address] [place] [address] Phone: [phone] Fax: [phone]     Social  Determinants of Health (SDOH) Social History: SDOH Screenings   Food Insecurity: No Food Insecurity (04/16/2023)  Housing: Low Risk  (04/16/2023)  Transportation Needs: No Transportation Needs (04/16/2023)  Utilities: Not At Risk (04/16/2023)  Depression (PHQ2-9): Low Risk  (12/23/2022)  Tobacco Use: Low Risk  (04/15/2023)   SDOH Interventions:     Readmission Risk Interventions     No data to display

## 2023-04-20 ENCOUNTER — Telehealth: Payer: Self-pay

## 2023-04-20 NOTE — Transitions of Care (Post Inpatient/ED Visit) (Signed)
   04/20/2023  Name: Virginia Mcdonald MRN: [id number] DOB: November 24, 1975  Today's TOC FU Call Status:    Transition Care Management Follow-up Telephone Call Date of Discharge: 04/17/23 Discharge Facility: [facility]) Type of Discharge: Inpatient Admission Primary Inpatient Discharge Diagnosis:: vision loss How have you been since you were released from the hospital?: Better  Items Reviewed: Did you receive and understand the discharge instructions provided?: Yes Medications obtained,verified, and reconciled?: Yes (Medications Reviewed) Any new allergies since your discharge?: No Dietary orders reviewed?: NA Do you have support at home?: Yes People in Home: other relative(s)  Medications Reviewed Today: Medications Reviewed Today   Medications were not reviewed in this encounter     Home Care and Equipment/Supplies: Were Home Health Services Ordered?: No Any new equipment or medical supplies ordered?: No  Functional Questionnaire: Do you need assistance with bathing/showering or dressing?: No Do you need assistance with meal preparation?: No Do you need assistance with eating?: No Do you have difficulty maintaining continence: No Do you need assistance with getting out of bed/getting out of a chair/moving?: No Do you have difficulty managing or taking your medications?: No  Follow up appointments reviewed: PCP Follow-up appointment confirmed?: NA (patient has new PCP.) Specialist Hospital Follow-up appointment confirmed?: NA Do you need transportation to your follow-up appointment?: No Do you understand care options if your condition(s) worsen?: Yes-patient verbalized understanding    SIGNATURE Randa Lynn, CMA

## 2023-04-20 NOTE — Transitions of Care (Post Inpatient/ED Visit) (Unsigned)
   04/20/2023  Name: Virginia Mcdonald MRN: [id number] DOB: [birth year]  Today's TOC FU Call Status: Today's TOC FU Call Status:: Unsuccessful Call (1st Attempt) Unsuccessful Call (1st Attempt) Date: 04/20/23  Attempted to reach the patient regarding the most recent Inpatient/ED visit.  Follow Up Plan: Additional outreach attempts will be made to reach the patient to complete the Transitions of Care (Post Inpatient/ED visit) call.   Signature   Woodfin Ganja LPN [facility] Nurse Health Advisor Direct Dial [phone]

## 2023-04-21 NOTE — Transitions of Care (Post Inpatient/ED Visit) (Signed)
   04/21/2023  Name: Nekaybaw Karter MRN: [id number] DOB: 27-Mar-1976  Opened iner error - not a pt    Woodfin Ganja LPN [facility] Nurse Health Advisor Direct Dial [phone]

## 2023-04-22 ENCOUNTER — Encounter: Payer: Self-pay | Admitting: Pulmonary Disease

## 2023-04-27 IMAGING — DX DG LUMBAR SPINE COMPLETE 4+V
5 series · 5 of 5 positions shown · non-contrast
Comparison: None Available.

CLINICAL DATA: Back pain

EXAM:
LUMBAR SPINE - COMPLETE 4+ VIEW

[l-spine ap]
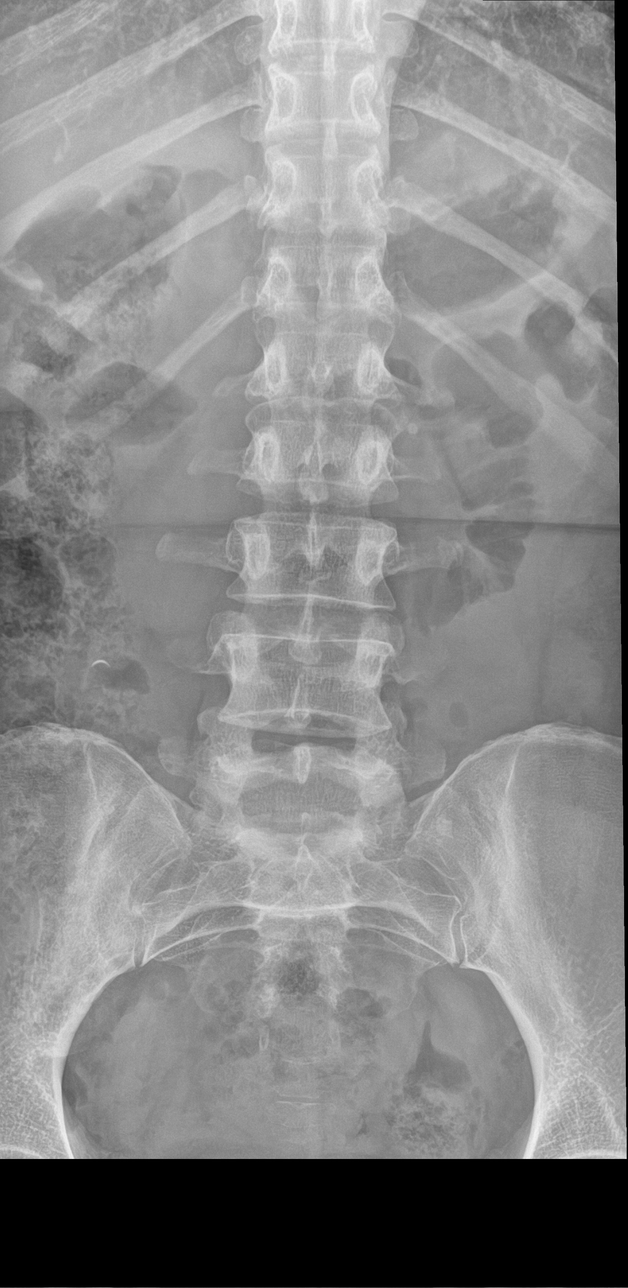

[l-spine obl (1 of 2)]
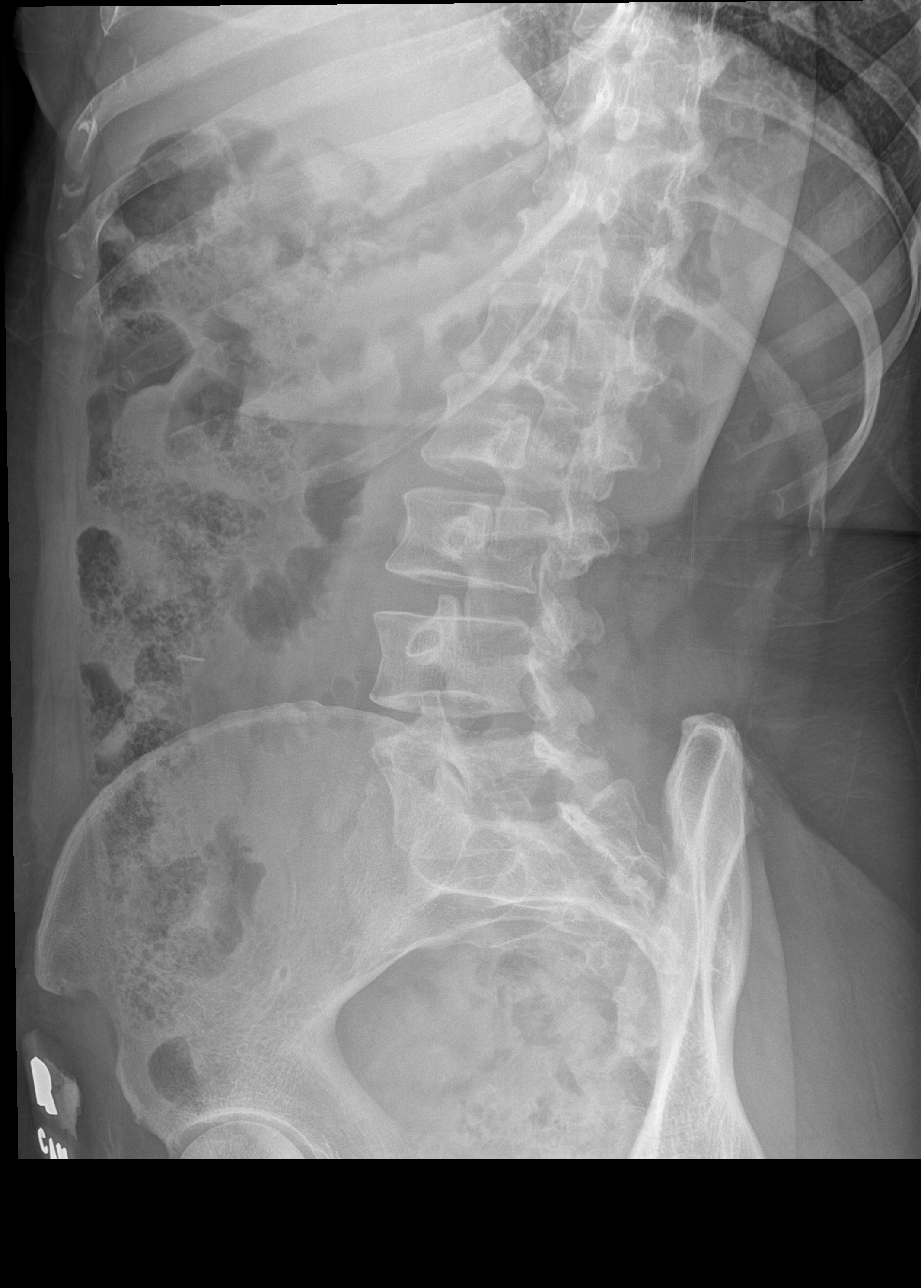

[l-spine obl (2 of 2)]
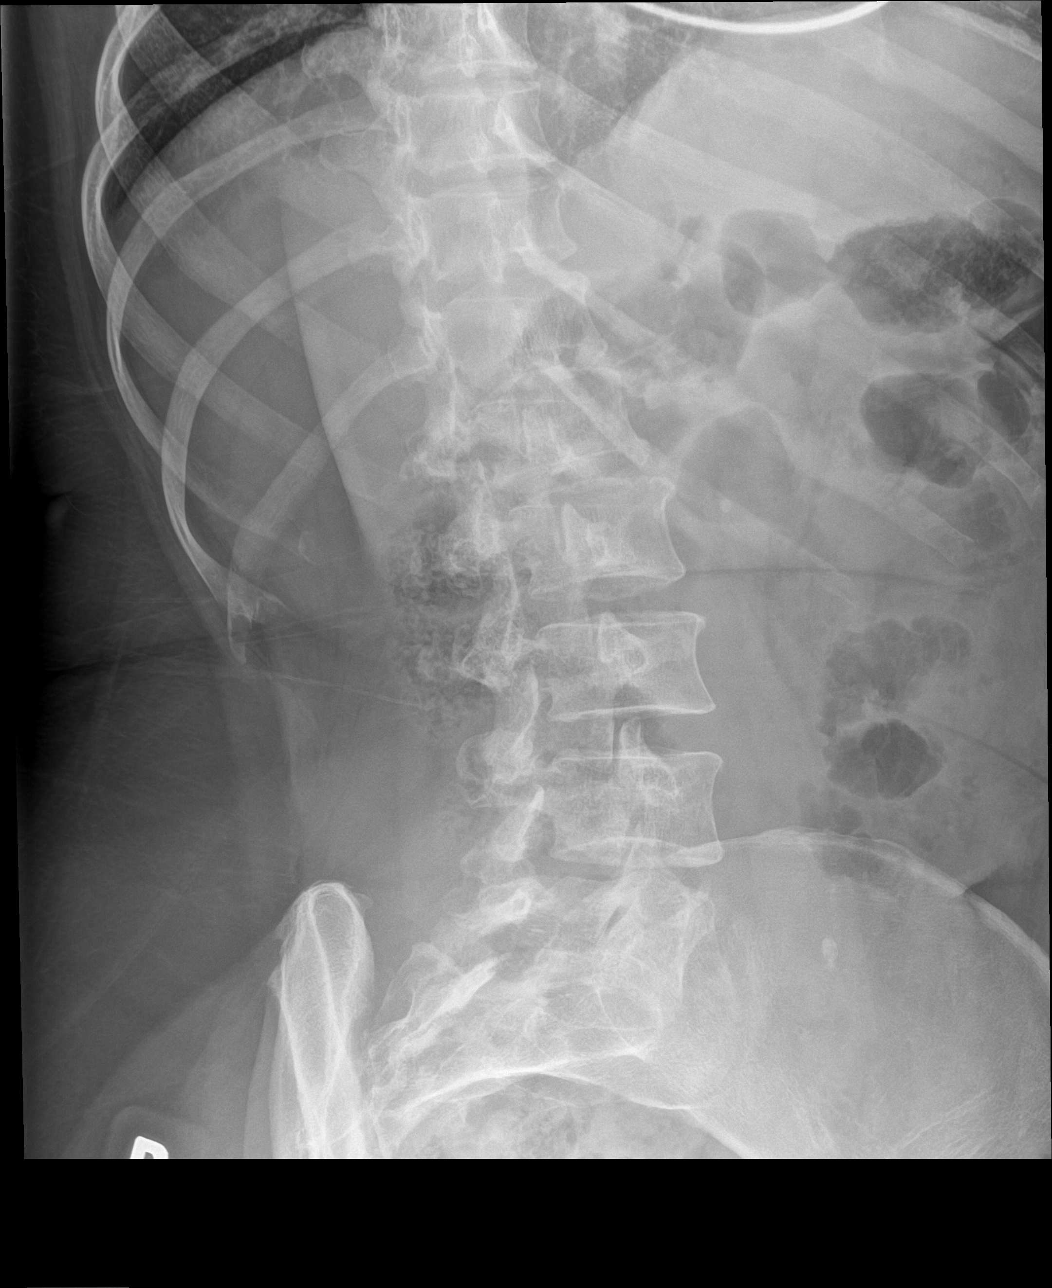

[l-spine lat]
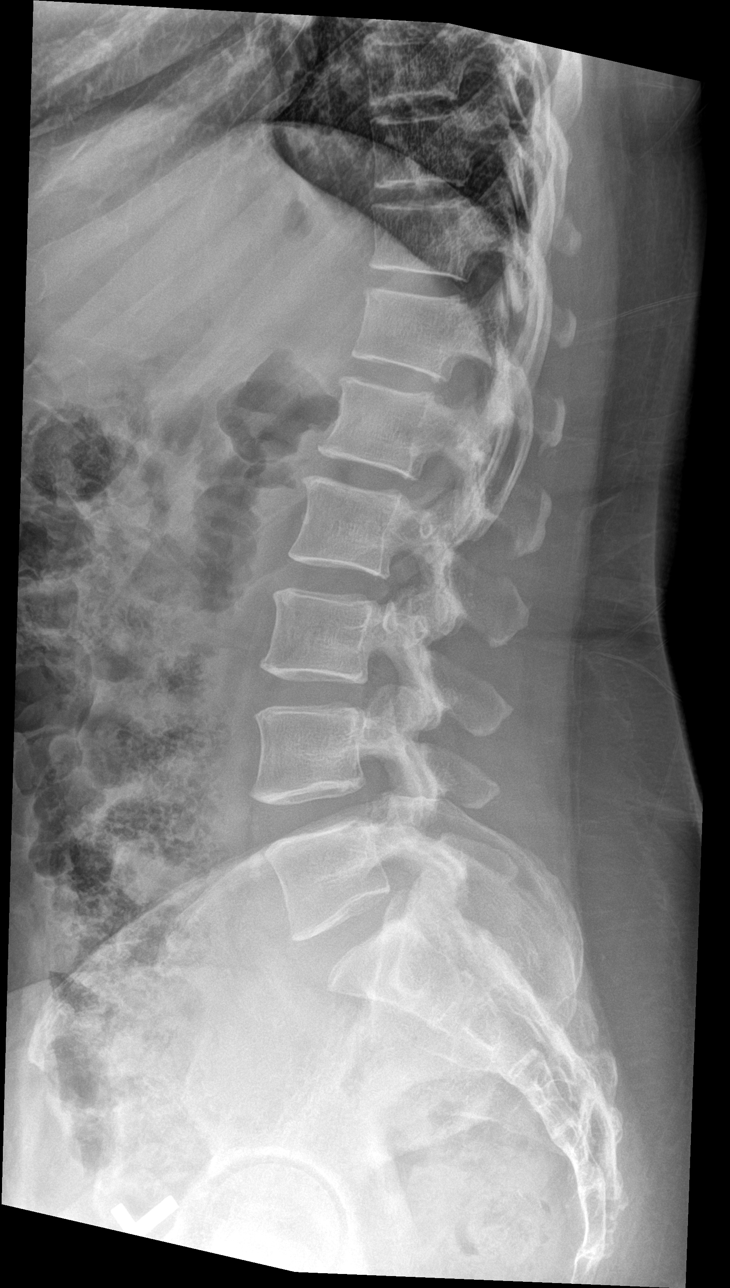

[l-spine spot]
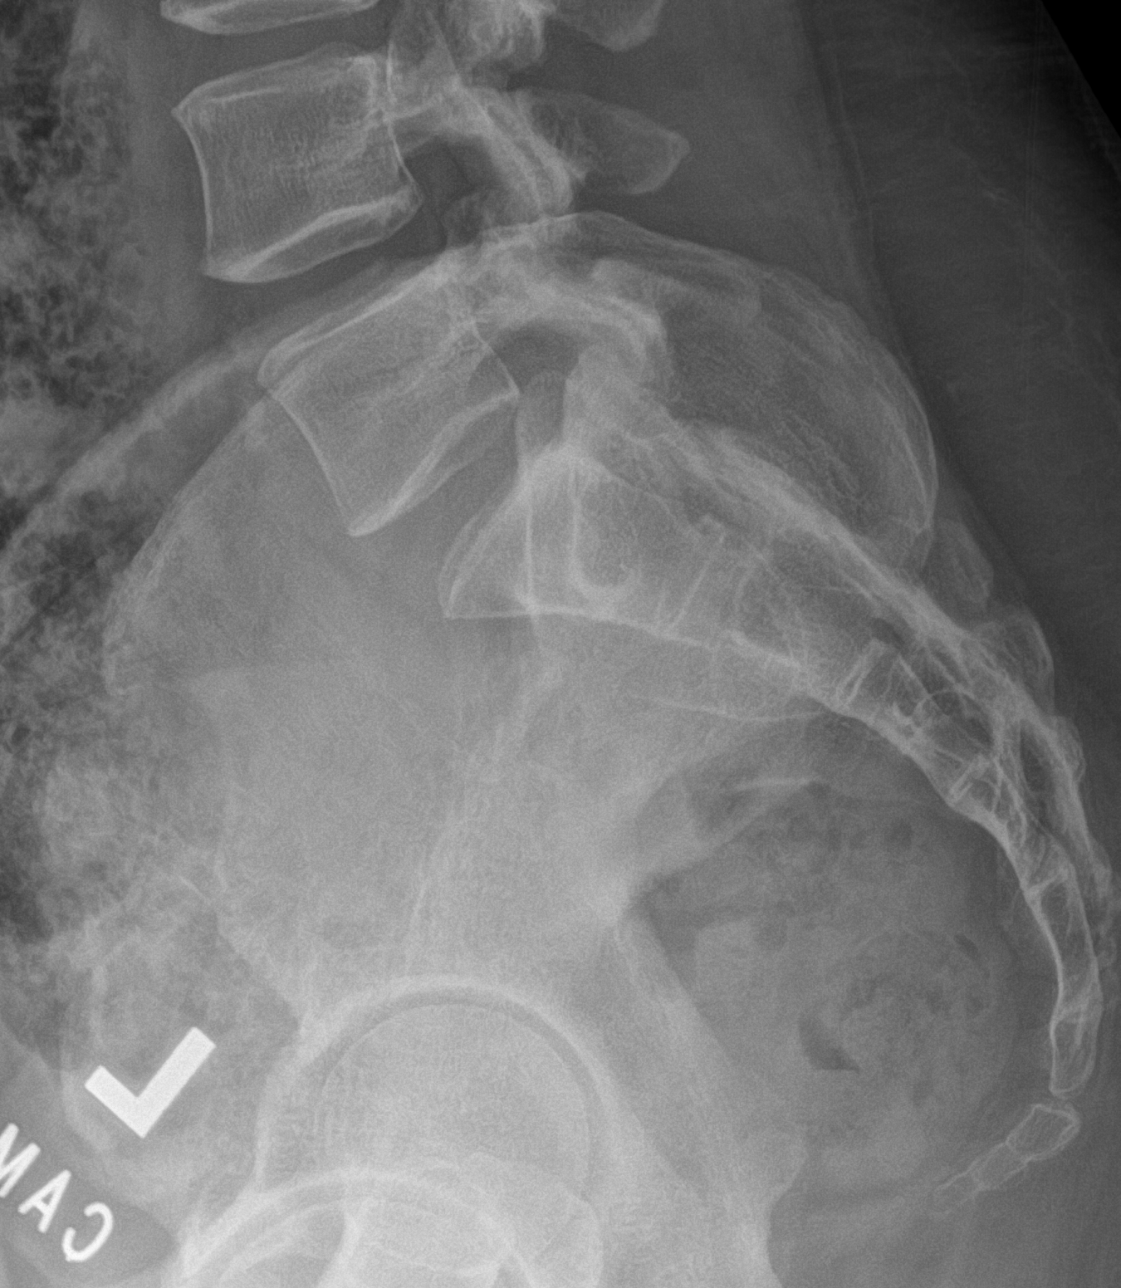

[5 of 5 positions shown; findings below may reference images not displayed]

FINDINGS: There is no evidence of lumbar spine fracture. Alignment is normal.
No spondylolysis identified. Intervertebral disc spaces are
maintained.
IMPRESSION: Negative.

## 2023-04-29 ENCOUNTER — Other Ambulatory Visit: Payer: Self-pay

## 2023-05-04 ENCOUNTER — Other Ambulatory Visit: Payer: Self-pay

## 2023-05-04 ENCOUNTER — Other Ambulatory Visit (HOSPITAL_COMMUNITY): Payer: Self-pay

## 2023-05-22 ENCOUNTER — Telehealth: Payer: Self-pay

## 2023-05-22 NOTE — Telephone Encounter (Signed)
Auth Submission: DENIED Site of care: Site of care: [facility] Payer: [employer] NEXT Medication & CPT/J Code(s) submitted: Leqvio (Inclisiran) [phone] Route o--f submission (phone, fax, portal): Fax Phone # [phone] Fax # [phone] Auth type: Buy/Bill PB Units/visits requested: 284mg  x 3 doses Reference number: Idelle Crouch [facility]  Authorization has been DENIED because the patient must try and fail Repatha. The patient must also have smoking and healthy heart diet counseling documented for the insurance company in order for Repatha to be approved. Another requirement was that the patient try atorvastatin or rosuvastatin for 3 months and fail. The patient did fail atorvastatin.

## 2023-05-27 ENCOUNTER — Encounter: Payer: Self-pay | Admitting: Pulmonary Disease

## 2023-06-04 ENCOUNTER — Encounter: Payer: Self-pay | Admitting: Pulmonary Disease

## 2023-08-24 ENCOUNTER — Ambulatory Visit: Payer: Self-pay | Admitting: Internal Medicine

## 2023-09-03 ENCOUNTER — Encounter: Payer: Self-pay | Admitting: Pulmonary Disease

## 2023-09-09 ENCOUNTER — Encounter: Payer: Self-pay | Admitting: Pulmonary Disease

## 2023-09-10 ENCOUNTER — Ambulatory Visit (INDEPENDENT_AMBULATORY_CARE_PROVIDER_SITE_OTHER): Payer: Commercial Managed Care - HMO | Admitting: Internal Medicine

## 2023-09-10 ENCOUNTER — Encounter: Payer: Self-pay | Admitting: Pulmonary Disease

## 2023-09-10 ENCOUNTER — Encounter: Payer: Self-pay | Admitting: Internal Medicine

## 2023-09-10 VITALS — BP 110/80 | HR 82 | Temp 98.3°F | Wt 143.6 lb

## 2023-09-10 DIAGNOSIS — G453 Amaurosis fugax: Secondary | ICD-10-CM

## 2023-09-10 DIAGNOSIS — E1165 Type 2 diabetes mellitus with hyperglycemia: Secondary | ICD-10-CM

## 2023-09-10 DIAGNOSIS — Z2989 Encounter for other specified prophylactic measures: Secondary | ICD-10-CM

## 2023-09-10 DIAGNOSIS — E7800 Pure hypercholesterolemia, unspecified: Secondary | ICD-10-CM | POA: Diagnosis not present

## 2023-09-10 DIAGNOSIS — Z1211 Encounter for screening for malignant neoplasm of colon: Secondary | ICD-10-CM | POA: Diagnosis not present

## 2023-09-10 MED ORDER — MEFLOQUINE HCL 250 MG PO TABS
ORAL_TABLET | ORAL | 0 refills | Status: DC
Start: 2023-09-10 — End: 2024-01-07

## 2023-09-10 MED ORDER — DAPAGLIFLOZIN PROPANEDIOL 10 MG PO TABS
10.0000 mg | ORAL_TABLET | Freq: Every day | ORAL | 1 refills | Status: DC
Start: 2023-09-10 — End: 2023-11-09

## 2023-09-10 NOTE — Progress Notes (Signed)
 I,Victoria T Emmitt, CMA,acting as a neurosurgeon for Virginia LOISE Slocumb, MD.,have documented all relevant documentation on the behalf of Virginia LOISE Slocumb, MD,as directed by  Virginia LOISE Slocumb, MD while in the presence of Virginia LOISE Slocumb, MD.  Subjective:  Patient ID: Virginia Mcdonald , female    DOB: [birth year] , 48 y.o.   MRN: [id number]  Chief Complaint  Patient presents with   Diabetes   Hyperlipidemia    HPI  The patient is here today for a follow-up for diabetes & chol.  She was seen by another PCP in 2024 due to insurance issues.  She reports compliance with her meds.  She denies headaches, chest pain and shortness of breath.   She would like to discuss Ozempic .  If her A1c is still high, she would like to consider this as an option.    She denies completing DM eye exam. She would like to be referred to ophthalmologist.    Diabetes She presents for her follow-up diabetic visit. She has type 2 diabetes mellitus. Her disease course has been stable. There are no hypoglycemic associated symptoms. Pertinent negatives for hypoglycemia include no dizziness or headaches. Pertinent negatives for diabetes include no polydipsia, no polyphagia and no polyuria. There are no hypoglycemic complications. There are no diabetic complications. Risk factors for coronary artery disease include diabetes mellitus. (Blood sugars in am 89 -131 range. )  Hyperlipidemia This is a chronic problem. The current episode started more than 1 year ago. Exacerbating diseases include diabetes. Current antihyperlipidemic treatment includes ezetimibe . Risk factors for coronary artery disease include diabetes mellitus and dyslipidemia.     Past Medical History:  Diagnosis Date   Diet-controlled diabetes mellitus (HCC)    type 2   Fibroids      Family History  Problem Relation Age of Onset   Diabetes Mother    Hypertension Mother    Hypertension Father    Hypertension Sister    Diabetes Sister    Audiological Scientist     Healthy Daughter    Healthy Brother    Healthy Brother    Healthy Daughter    Healthy Daughter      Current Outpatient Medications:    Accu-Chek FastClix Lancets MISC, Use as directed to check blood sugars 1 time per day dx: e11.65, Disp: 50 each, Rfl: 11   Blood Glucose Monitoring Suppl (BLOOD GLUCOSE MONITOR SYSTEM) w/Device KIT, 1 each by Does not apply route in the morning, at noon, and at bedtime., Disp: 1 kit, Rfl: 0   ezetimibe  (ZETIA ) 10 MG tablet, Take 1 tablet (10 mg total) by mouth daily., Disp: 60 tablet, Rfl: 0   glucose blood (ACCU-CHEK GUIDE) test strip, Use as instructed to check blood sugars 1 time per day dx: e11.65, Disp: 50 each, Rfl: 11   aspirin  EC 81 MG tablet, Take 1 tablet (81 mg total) by mouth daily. Swallow whole. (Patient not taking: Reported on 09/10/2023), Disp: 120 tablet, Rfl: 1   Continuous Glucose Sensor (FREESTYLE LIBRE 3 SENSOR) MISC, USE TO CHECK BLOOD SUGAR THREE TIMES DAILY (Patient not taking: Reported on 09/10/2023), Disp: 3 each, Rfl: 0   dapagliflozin  propanediol (FARXIGA ) 10 MG TABS tablet, Take 1 tablet (10 mg total) by mouth daily before breakfast., Disp: 90 tablet, Rfl: 1   mefloquine  (LARIAM ) 250 MG tablet, Please take po once weekly, start one week prior to travel (09/15/23) and continue for four weeks after you return, Disp: 10 tablet, Rfl: 0   Allergies  Allergen  Reactions   Penicillins Anaphylaxis   Claritin-D 24 Hour [Loratadine-Pseudoephedrine Er] Other (See Comments)    Pt reports she ran a fever, became very fatigue.    Procardia [Nifedipine] Palpitations     Review of Systems  Constitutional: Negative.   Respiratory: Negative.    Cardiovascular: Negative.   Gastrointestinal: Negative.   Endocrine: Negative for polydipsia, polyphagia and polyuria.  Neurological: Negative.  Negative for dizziness and headaches.  Psychiatric/Behavioral: Negative.       Today's Vitals   09/10/23 1219  BP: 110/80  Pulse: 82  Temp: 98.3 F (36.8  C)  SpO2: 98%  Weight: 143 lb 9.6 oz (65.1 kg)   Body mass index is 25.44 kg/m.  Wt Readings from Last 3 Encounters:  09/11/23 143 lb (64.9 kg)  09/10/23 143 lb 9.6 oz (65.1 kg)  04/15/23 134 lb (60.8 kg)     Objective:  Physical Exam Vitals and nursing note reviewed.  Constitutional:      Appearance: Normal appearance.  HENT:     Head: Normocephalic and atraumatic.  Cardiovascular:     Rate and Rhythm: Normal rate and regular rhythm.     Heart sounds: Normal heart sounds.  Pulmonary:     Effort: Pulmonary effort is normal.     Breath sounds: Normal breath sounds.  Skin:    General: Skin is warm.  Neurological:     General: No focal deficit present.     Mental Status: She is alert.  Psychiatric:        Mood and Affect: Mood normal.        Behavior: Behavior normal.         Assessment And Plan:  Uncontrolled type 2 diabetes mellitus with hyperglycemia (HCC) Assessment & Plan: Chronic, I will check labs as below. She will continue with Farxiga  10mg  daily. Importance of medication/dietary compliance was discussed with the patient.   Orders: -     CMP14+EGFR -     Hemoglobin A1c -     TSH -     Dapagliflozin  Propanediol; Take 1 tablet (10 mg total) by mouth daily before breakfast.  Dispense: 90 tablet; Refill: 1  Pure hypercholesterolemia Assessment & Plan: Chronic, LDL goal is less than 70.   Unfortunately, she did not tolerate statin therapy. She is now on Zeta 10mg  daily.   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 She is not on statin therapy due to intolerance.  She is currently on Zetia . She is now on ASA therapy. Unfortunately, has not had proper follow up with Neurology. Will place referral and schedule f/u CT angio as suggested in inpatient notes.   Orders: -     Ambulatory referral to Neurology -     CT ANGIO HEAD NECK W WO CM;  Future  Screen for colon cancer -     Ambulatory referral to Gastroenterology  Need for malaria prophylaxis Assessment & Plan: She has upcoming trip to [place] and requests rx mefloquine .   Orders: -     Mefloquine  HCl; Please take po once weekly, start one week prior to travel (09/15/23) and continue for four weeks after you return  Dispense: 10 tablet; Refill: 0     Return in 4 months (on 01/08/2024), or dm check, for sept 2025.  Patient was given opportunity to ask questions. Patient verbalized understanding of the plan and was able  to repeat key elements of the plan. All questions were answered to their satisfaction.    I, Virginia LOISE Slocumb, MD, have reviewed all documentation for this visit. The documentation on 09/10/23 for the exam, diagnosis, procedures, and orders are all accurate and complete.   IF YOU HAVE BEEN REFERRED TO A SPECIALIST, IT MAY TAKE 1-2 WEEKS TO SCHEDULE/PROCESS THE REFERRAL. IF YOU HAVE NOT HEARD FROM US /SPECIALIST IN TWO WEEKS, PLEASE GIVE US  A CALL AT [phone].   THE PATIENT IS ENCOURAGED TO PRACTICE SOCIAL DISTANCING DUE TO THE COVID-19 PANDEMIC.

## 2023-09-10 NOTE — Patient Instructions (Signed)

## 2023-09-11 ENCOUNTER — Ambulatory Visit (INDEPENDENT_AMBULATORY_CARE_PROVIDER_SITE_OTHER): Payer: Commercial Managed Care - HMO

## 2023-09-11 VITALS — BP 120/80 | HR 70 | Temp 98.5°F | Ht 63.0 in | Wt 143.0 lb

## 2023-09-11 DIAGNOSIS — Z23 Encounter for immunization: Secondary | ICD-10-CM

## 2023-09-11 LAB — CMP14+EGFR
ALT: 17 [IU]/L (ref 0–32)
AST: 16 [IU]/L (ref 0–40)
Albumin: 4.3 g/dL (ref 3.9–4.9)
Alkaline Phosphatase: 93 [IU]/L (ref 44–121)
BUN/Creatinine Ratio: 20 (ref 9–23)
BUN: 16 mg/dL (ref 6–24)
Bilirubin Total: <0.2 mg/dL (ref 0.0–1.2)
CO2: 24 mmol/L (ref 20–29)
Calcium: 9.6 mg/dL (ref 8.7–10.2)
Chloride: 102 mmol/L (ref 96–106)
Creatinine, Ser: 0.8 mg/dL (ref 0.57–1.00)
Globulin, Total: 2.7 g/dL (ref 1.5–4.5)
Glucose: 188 mg/dL — ABNORMAL HIGH (ref 70–99)
Potassium: 4.2 mmol/L (ref 3.5–5.2)
Sodium: 141 mmol/L (ref 134–144)
Total Protein: 7 g/dL (ref 6.0–8.5)
eGFR: 92 mL/min/{1.73_m2} (ref >59)

## 2023-09-11 LAB — HEMOGLOBIN A1C
Est. average glucose Bld gHb Est-mCnc: 166 mg/dL
Hgb A1c MFr Bld: 7.4 % — ABNORMAL HIGH (ref 4.8–5.6)

## 2023-09-11 LAB — TSH: TSH: 0.78 u[IU]/mL (ref 0.450–4.500)

## 2023-09-11 NOTE — Progress Notes (Signed)
 Patient presents today for flu shot. Patient received flu shot in L arm.

## 2023-09-13 DIAGNOSIS — G453 Amaurosis fugax: Secondary | ICD-10-CM | POA: Insufficient documentation

## 2023-09-13 DIAGNOSIS — Z2989 Encounter for other specified prophylactic measures: Secondary | ICD-10-CM | POA: Insufficient documentation

## 2023-09-13 NOTE — Assessment & Plan Note (Signed)
 She has upcoming trip to [place] and requests rx mefloquine.

## 2023-09-13 NOTE — Assessment & Plan Note (Signed)
 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 She is not on statin therapy due to intolerance.  She is currently on Zetia . She is now on ASA therapy. Unfortunately, has not had proper follow up with Neurology. Will place referral and schedule f/u CT angio as suggested in inpatient notes.

## 2023-09-13 NOTE — Assessment & Plan Note (Signed)
 Chronic, LDL goal is less than 70.   Unfortunately, she did not tolerate statin therapy. She is now on Zeta 10mg  daily.

## 2023-09-13 NOTE — Assessment & Plan Note (Signed)
 Chronic, I will check labs as below. She will continue with Farxiga 10mg  daily. Importance of medication/dietary compliance was discussed with the patient.

## 2023-09-14 ENCOUNTER — Encounter: Payer: Self-pay | Admitting: Internal Medicine

## 2023-09-17 ENCOUNTER — Other Ambulatory Visit: Payer: Self-pay | Admitting: Internal Medicine

## 2023-09-22 ENCOUNTER — Encounter: Payer: Self-pay | Admitting: Pulmonary Disease

## 2023-10-08 MED ORDER — FREESTYLE LIBRE 3 SENSOR MISC: 0 refills | Status: DC

## 2023-10-29 ENCOUNTER — Telehealth: Payer: Self-pay

## 2023-10-29 ENCOUNTER — Ambulatory Visit (HOSPITAL_BASED_OUTPATIENT_CLINIC_OR_DEPARTMENT_OTHER)
Admission: RE | Admit: 2023-10-29 | Discharge: 2023-10-29 | Disposition: A | Discharge status: Home / Self Care | Payer: Commercial Managed Care - HMO | Source: Ambulatory Visit | Attending: Internal Medicine | Admitting: Internal Medicine

## 2023-10-29 ENCOUNTER — Encounter (HOSPITAL_BASED_OUTPATIENT_CLINIC_OR_DEPARTMENT_OTHER): Payer: Self-pay

## 2023-10-29 DIAGNOSIS — G453 Amaurosis fugax: Secondary | ICD-10-CM | POA: Diagnosis present

## 2023-10-29 MED ORDER — IOHEXOL 350 MG/ML SOLN
100.0000 mL | Freq: Once | INTRAVENOUS | Status: AC | PRN
  Administered 2023-10-29: 75 mL via INTRAVENOUS

## 2023-10-29 NOTE — Telephone Encounter (Signed)
LMOVM FOR PT THAT NPI # NEEDED TO BE CHANGED FOR THE AUTHORIZATION OF HER CT SCANS FOR HEAD AND NECK, AND I WOULD SEND HER A MESSAGE THRU MY CHART.

## 2023-11-02 ENCOUNTER — Other Ambulatory Visit: Payer: Commercial Managed Care - HMO

## 2023-11-02 ENCOUNTER — Other Ambulatory Visit: Payer: Self-pay

## 2023-11-02 DIAGNOSIS — E1165 Type 2 diabetes mellitus with hyperglycemia: Secondary | ICD-10-CM

## 2023-11-03 ENCOUNTER — Other Ambulatory Visit: Payer: Self-pay | Admitting: Internal Medicine

## 2023-11-03 DIAGNOSIS — Z1231 Encounter for screening mammogram for malignant neoplasm of breast: Secondary | ICD-10-CM

## 2023-11-03 LAB — LIPID PANEL
Chol/HDL Ratio: 2.4 {ratio} (ref 0.0–4.4)
Cholesterol, Total: 149 mg/dL (ref 100–199)
HDL: 61 mg/dL (ref >39)
LDL Chol Calc (NIH): 77 mg/dL (ref 0–99)
Triglycerides: 54 mg/dL (ref 0–149)
VLDL Cholesterol Cal: 11 mg/dL (ref 5–40)

## 2023-11-03 LAB — CMP14+EGFR
ALT: 14 [IU]/L (ref 0–32)
AST: 16 [IU]/L (ref 0–40)
Albumin: 4.4 g/dL (ref 3.9–4.9)
Alkaline Phosphatase: 92 [IU]/L (ref 44–121)
BUN/Creatinine Ratio: 15 (ref 9–23)
BUN: 13 mg/dL (ref 6–24)
Bilirubin Total: 0.4 mg/dL (ref 0.0–1.2)
CO2: 23 mmol/L (ref 20–29)
Calcium: 9.8 mg/dL (ref 8.7–10.2)
Chloride: 103 mmol/L (ref 96–106)
Creatinine, Ser: 0.84 mg/dL (ref 0.57–1.00)
Globulin, Total: 3 g/dL (ref 1.5–4.5)
Glucose: 109 mg/dL — ABNORMAL HIGH (ref 70–99)
Potassium: 4.4 mmol/L (ref 3.5–5.2)
Sodium: 141 mmol/L (ref 134–144)
Total Protein: 7.4 g/dL (ref 6.0–8.5)
eGFR: 87 mL/min/{1.73_m2} (ref >59)

## 2023-11-03 LAB — CBC
Hematocrit: 46.2 % (ref 34.0–46.6)
Hemoglobin: 15.3 g/dL (ref 11.1–15.9)
MCH: 29.3 pg (ref 26.6–33.0)
MCHC: 33.1 g/dL (ref 31.5–35.7)
MCV: 89 fL (ref 79–97)
Platelets: 151 10*3/uL (ref 150–450)
RBC: 5.22 x10E6/uL (ref 3.77–5.28)
RDW: 14.6 % (ref 11.7–15.4)
WBC: 4.7 10*3/uL (ref 3.4–10.8)

## 2023-11-03 LAB — HEMOGLOBIN A1C
Est. average glucose Bld gHb Est-mCnc: 157 mg/dL
Hgb A1c MFr Bld: 7.1 % — ABNORMAL HIGH (ref 4.8–5.6)

## 2023-11-04 ENCOUNTER — Ambulatory Visit
Admission: RE | Admit: 2023-11-04 | Discharge: 2023-11-04 | Disposition: A | Discharge status: Home / Self Care | Payer: Commercial Managed Care - HMO | Source: Ambulatory Visit | Attending: Internal Medicine | Admitting: Internal Medicine

## 2023-11-04 ENCOUNTER — Encounter: Payer: Self-pay | Admitting: Gastroenterology

## 2023-11-04 DIAGNOSIS — Z1231 Encounter for screening mammogram for malignant neoplasm of breast: Secondary | ICD-10-CM

## 2023-11-09 ENCOUNTER — Encounter: Payer: Self-pay | Admitting: Gastroenterology

## 2023-11-09 ENCOUNTER — Encounter: Payer: Self-pay | Admitting: Internal Medicine

## 2023-11-09 ENCOUNTER — Ambulatory Visit (INDEPENDENT_AMBULATORY_CARE_PROVIDER_SITE_OTHER): Payer: No Typology Code available for payment source | Admitting: Internal Medicine

## 2023-11-09 VITALS — BP 108/70 | HR 84 | Temp 98.5°F | Ht 63.0 in | Wt 143.6 lb

## 2023-11-09 DIAGNOSIS — G453 Amaurosis fugax: Secondary | ICD-10-CM

## 2023-11-09 DIAGNOSIS — Z0000 Encounter for general adult medical examination without abnormal findings: Secondary | ICD-10-CM | POA: Insufficient documentation

## 2023-11-09 DIAGNOSIS — E785 Hyperlipidemia, unspecified: Secondary | ICD-10-CM | POA: Diagnosis not present

## 2023-11-09 DIAGNOSIS — E1169 Type 2 diabetes mellitus with other specified complication: Secondary | ICD-10-CM

## 2023-11-09 DIAGNOSIS — M542 Cervicalgia: Secondary | ICD-10-CM | POA: Diagnosis not present

## 2023-11-09 LAB — POCT URINALYSIS DIPSTICK
Bilirubin, UA: NEGATIVE
Blood, UA: NEGATIVE
Glucose, UA: POSITIVE — AB
Ketones, UA: NEGATIVE
Leukocytes, UA: NEGATIVE
Nitrite, UA: NEGATIVE
Protein, UA: NEGATIVE
Spec Grav, UA: 1.025 (ref 1.010–1.025)
Urobilinogen, UA: 0.2 U/dL
pH, UA: 5.5 (ref 5.0–8.0)

## 2023-11-09 MED ORDER — DAPAGLIFLOZIN PROPANEDIOL 10 MG PO TABS: 10.0000 mg | ORAL_TABLET | Freq: Every day | ORAL | 1 refills | Status: DC

## 2023-11-09 MED ORDER — EZETIMIBE 10 MG PO TABS
10.0000 mg | ORAL_TABLET | Freq: Every day | ORAL | 2 refills | Status: DC
Start: 2023-11-09 — End: 2024-01-07

## 2023-11-09 NOTE — Assessment & Plan Note (Addendum)
 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 She is not on statin therapy due to intolerance.  She is currently on Zetia. She is now on ASA therapy. She still has not seen Neuro, despite referral placed in January. She has not had recurrent sx.  Will have referral coordinator give her number to contact [facility] to schedule an appointment with stroke team ASAP.

## 2023-11-09 NOTE — Assessment & Plan Note (Addendum)
 Chronic,diabetic foot exam was performed. I will check labs as below. EKG performed, NSR w/ neg precordial T waves.  She will continue with [place] 10mg  daily. Importance of medication/dietary compliance was discussed with the patient. I DISCUSSED WITH THE PATIENT AT LENGTH REGARDING THE GOALS OF GLYCEMIC CONTROL AND POSSIBLE LONG-TERM COMPLICATIONS.  I  ALSO STRESSED THE IMPORTANCE OF COMPLIANCE WITH HOME GLUCOSE MONITORING, DIETARY RESTRICTIONS INCLUDING AVOIDANCE OF SUGARY DRINKS/PROCESSED FOODS,  ALONG WITH REGULAR EXERCISE.  I  ALSO STRESSED THE IMPORTANCE OF ANNUAL EYE EXAMS, SELF FOOT CARE AND COMPLIANCE WITH OFFICE VISITS.

## 2023-11-09 NOTE — Progress Notes (Signed)
 I,Victoria T Deloria Lair, CMA,acting as a Neurosurgeon for Gwynneth Aliment, MD.,have documented all relevant documentation on the behalf of Gwynneth Aliment, MD,as directed by  Gwynneth Aliment, MD while in the presence of Gwynneth Aliment, MD.  Subjective:    Patient ID: Virginia Mcdonald , female    DOB: February 08, 1976 , 48 y.o.   MRN: [id number]  Chief Complaint  Patient presents with   Annual Exam   Diabetes   Hyperlipidemia    HPI  Patient presents today for annual exam. She reports compliance with medications. Denies headache, chest pain & sob. GYN: Dr Richardson Dopp.  She has not yet scheduled colonoscopy.     Diabetes She presents for her follow-up diabetic visit. She has type 2 diabetes mellitus. Her disease course has been stable. There are no hypoglycemic associated symptoms. Pertinent negatives for hypoglycemia include no dizziness or headaches. Pertinent negatives for diabetes include no polydipsia, no polyphagia and no polyuria. There are no hypoglycemic complications. There are no diabetic complications. Risk factors for coronary artery disease include diabetes mellitus. (Blood sugars in am 89 -131 range. )  Hyperlipidemia This is a chronic problem. The current episode started more than 1 year ago. Exacerbating diseases include diabetes. Current antihyperlipidemic treatment includes ezetimibe. Risk factors for coronary artery disease include diabetes mellitus and dyslipidemia.     Past Medical History:  Diagnosis Date   Diet-controlled diabetes mellitus (HCC)    type 2   Fibroids      Family History  Problem Relation Age of Onset   Diabetes Mother    Hypertension Mother    Hypertension Father    Hypertension Sister    Diabetes Sister    Audiological scientist    Healthy Daughter    Healthy Brother    Healthy Brother    Healthy Daughter    Healthy Daughter      Current Outpatient Medications:    Accu-Chek FastClix Lancets MISC, Use as directed to check blood sugars 1 time per day dx:  e11.65, Disp: 50 each, Rfl: 11   Blood Glucose Monitoring Suppl (BLOOD GLUCOSE MONITOR SYSTEM) w/Device KIT, 1 each by Does not apply route in the morning, at noon, and at bedtime., Disp: 1 kit, Rfl: 0   Continuous Glucose Sensor (FREESTYLE LIBRE 3 SENSOR) MISC, USE TO CHECK BLOOD SUGAR THREE TIMES DAILY, Disp: 3 each, Rfl: 0   glucose blood (ACCU-CHEK GUIDE) test strip, Use as instructed to check blood sugars 1 time per day dx: e11.65, Disp: 50 each, Rfl: 11   mefloquine (LARIAM) 250 MG tablet, Please take po once weekly, start one week prior to travel (09/15/23) and continue for four weeks after you return, Disp: 10 tablet, Rfl: 0   aspirin EC 81 MG tablet, Take 1 tablet (81 mg total) by mouth daily. Swallow whole. (Patient not taking: Reported on 11/09/2023), Disp: 120 tablet, Rfl: 1   dapagliflozin propanediol (FARXIGA) 10 MG TABS tablet, Take 1 tablet (10 mg total) by mouth daily before breakfast., Disp: 90 tablet, Rfl: 1   ezetimibe (ZETIA) 10 MG tablet, Take 1 tablet (10 mg total) by mouth daily., Disp: 90 tablet, Rfl: 2   Allergies  Allergen Reactions   Penicillins Anaphylaxis   Claritin-D 24 Hour [Loratadine-Pseudoephedrine Er] Other (See Comments)    Pt reports she ran a fever, became very fatigue.    Procardia [Nifedipine] Palpitations      The patient states she uses status post hysterectomy for birth control. Patient's last menstrual period was 10/23/2020.Marland Kitchen  Negative for Dysmenorrhea. Negative for: breast discharge, breast lump(s), breast pain and breast self exam. Associated symptoms include abnormal vaginal bleeding. Pertinent negatives include abnormal bleeding (hematology), anxiety, decreased libido, depression, difficulty falling sleep, dyspareunia, history of infertility, nocturia, sexual dysfunction, sleep disturbances, urinary incontinence, urinary urgency, vaginal discharge and vaginal itching. Diet regular.The patient states her exercise level is  moderate.   . The patient's  tobacco use is:  Social History   Tobacco Use  Smoking Status Never  Smokeless Tobacco Never  . She has been exposed to passive smoke. The patient's alcohol use is:  Social History   Substance and Sexual Activity  Alcohol Use Never    Review of Systems  Constitutional: Negative.   HENT: Negative.    Eyes: Negative.   Respiratory: Negative.    Cardiovascular: Negative.   Gastrointestinal: Negative.   Endocrine: Negative.  Negative for polydipsia, polyphagia and polyuria.  Genitourinary: Negative.   Musculoskeletal:  Positive for neck pain.       She complains of neck pain. She is not currently established with orthopedic. She states this neck pain has been ongoing for a while. She has taken turmeric, which has helped with her symptoms.   Skin: Negative.   Allergic/Immunologic: Negative.   Neurological: Negative.  Negative for dizziness and headaches.  Hematological: Negative.   Psychiatric/Behavioral: Negative.       Today's Vitals   11/09/23 1406  BP: 108/70  Pulse: 84  Temp: 98.5 F (36.9 C)  SpO2: 98%  Weight: 143 lb 9.6 oz (65.1 kg)  Height: 5\' 3"  (1.6 m)   Body mass index is 25.44 kg/m.  Wt Readings from Last 3 Encounters:  11/09/23 143 lb 9.6 oz (65.1 kg)  09/11/23 143 lb (64.9 kg)  09/10/23 143 lb 9.6 oz (65.1 kg)     Objective:  Physical Exam Vitals and nursing note reviewed.  Constitutional:      Appearance: Normal appearance.  HENT:     Head: Normocephalic and atraumatic.     Right Ear: Tympanic membrane, ear canal and external ear normal.     Left Ear: Tympanic membrane, ear canal and external ear normal.     Nose:     Comments: Masked     Mouth/Throat:     Comments: Masked  Eyes:     Extraocular Movements: Extraocular movements intact.     Conjunctiva/sclera: Conjunctivae normal.     Pupils: Pupils are equal, round, and reactive to light.  Cardiovascular:     Rate and Rhythm: Normal rate and regular rhythm.     Pulses:          Dorsalis  pedis pulses are 3+ on the right side and 3+ on the left side.       Posterior tibial pulses are 2+ on the right side and 2+ on the left side.     Heart sounds: Normal heart sounds.  Pulmonary:     Effort: Pulmonary effort is normal.     Breath sounds: Normal breath sounds.  Chest:  Breasts:    Tanner Score is 5.     Right: Normal.     Left: Normal.  Abdominal:     General: Abdomen is flat. Bowel sounds are normal.     Palpations: Abdomen is soft.  Genitourinary:    Comments: deferred Musculoskeletal:        General: Normal range of motion.     Cervical back: Normal range of motion and neck supple.  Feet:  Right foot:     Protective Sensation: 5 sites tested.  5 sites sensed.     Skin integrity: Skin integrity normal.     Toenail Condition: Right toenails are normal.     Left foot:     Protective Sensation: 5 sites tested.  5 sites sensed.     Skin integrity: Skin integrity normal.     Toenail Condition: Left toenails are normal.  Skin:    General: Skin is warm and dry.  Neurological:     General: No focal deficit present.     Mental Status: She is alert and oriented to person, place, and time.  Psychiatric:        Mood and Affect: Mood normal.        Behavior: Behavior normal.         Assessment And Plan:     Encounter for annual health examination Assessment & Plan: A full exam was performed.  Importance of monthly self breast exams was discussed with the patient.  She is advised to get 30-45 minutes of regular exercise, no less than four to five days per week. Both weight-bearing and aerobic exercises are recommended.  She is advised to follow a healthy diet with at least six fruits/veggies per day, decrease intake of red meat and other saturated fats and to increase fish intake to twice weekly.  Meats/fish should not be fried -- baked, boiled or broiled is preferable. It is also important to cut back on your sugar intake.  Be sure to read labels - try to avoid  anything with added sugar, high fructose corn syrup or other sweeteners.  If you must use a sweetener, you can try stevia or monkfruit.  It is also important to avoid artificially sweetened foods/beverages and diet drinks. Lastly, wear SPF 50 sunscreen on exposed skin and when in direct sunlight for an extended period of time.  Be sure to avoid fast food restaurants and aim for at least 60 ounces of water daily.       Dyslipidemia associated with type 2 diabetes mellitus (HCC) Assessment & Plan: Chronic,diabetic foot exam was performed. I will check labs as below. EKG performed, NSR w/ neg precordial T waves.  She will continue with Farxiga 10mg  daily. Importance of medication/dietary compliance was discussed with the patient. I DISCUSSED WITH THE PATIENT AT LENGTH REGARDING THE GOALS OF GLYCEMIC CONTROL AND POSSIBLE LONG-TERM COMPLICATIONS.  I  ALSO STRESSED THE IMPORTANCE OF COMPLIANCE WITH HOME GLUCOSE MONITORING, DIETARY RESTRICTIONS INCLUDING AVOIDANCE OF SUGARY DRINKS/PROCESSED FOODS,  ALONG WITH REGULAR EXERCISE.  I  ALSO STRESSED THE IMPORTANCE OF ANNUAL EYE EXAMS, SELF FOOT CARE AND COMPLIANCE WITH OFFICE VISITS.   Orders: -     POCT urinalysis dipstick -     Microalbumin / creatinine urine ratio -     EKG 12-Lead -     Dapagliflozin Propanediol; Take 1 tablet (10 mg total) by mouth daily before breakfast.  Dispense: 90 tablet; Refill: 1 -     Ezetimibe; Take 1 tablet (10 mg total) by mouth daily.  Dispense: 90 tablet; Refill: 2  Cervicalgia Assessment & Plan: Chronic, this is how she presented with vertebral artery dissection (on the right).  Today, the left side of her neck hurts.  CT angio results reviewed.  I planned to send her muscle relaxer, she does not wish to take rx meds. Advised to apply topical pain cream to affected area nightly. She will let me know if her sx persist.  Amaurosis fugax Assessment & Plan: Chart review revealed August 2024 hospitalization due to  vertebral artery dissection. She is not on statin therapy due to intolerance.  She is currently on Zetia. She is now on ASA therapy. She still has not seen Neuro, despite referral placed in January. She has not had recurrent sx.  Will have referral coordinator give her number to contact [facility] to schedule an appointment with stroke team ASAP.        Return for 1 year HM , 4 MONTH DM F/U. Patient was given opportunity to ask questions. Patient verbalized understanding of the plan and was able to repeat key elements of the plan. All questions were answered to their satisfaction.    I, Gwynneth Aliment, MD, have reviewed all documentation for this visit. The documentation on 11/09/23 for the exam, diagnosis, procedures, and orders are all accurate and complete.

## 2023-11-09 NOTE — Assessment & Plan Note (Signed)

## 2023-11-09 NOTE — Assessment & Plan Note (Addendum)
 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  Today, the left side of her neck hurts.  CT angio results reviewed.  I planned to send her muscle relaxer, she does not wish to take rx meds. Advised to apply topical pain cream to affected area nightly. She will let me know if her sx persist.

## 2023-11-09 NOTE — Patient Instructions (Signed)

## 2023-11-10 LAB — HM DIABETES EYE EXAM

## 2023-11-11 LAB — MICROALBUMIN / CREATININE URINE RATIO
Creatinine, Urine: 89.6 mg/dL
Microalb/Creat Ratio: 31 mg/g{creat} — ABNORMAL HIGH (ref 0–29)
Microalbumin, Urine: 27.6 ug/mL

## 2023-12-09 ENCOUNTER — Ambulatory Visit (AMBULATORY_SURGERY_CENTER): Admitting: *Deleted

## 2023-12-09 VITALS — Ht 63.0 in | Wt 136.0 lb

## 2023-12-09 DIAGNOSIS — Z1211 Encounter for screening for malignant neoplasm of colon: Secondary | ICD-10-CM

## 2023-12-09 MED ORDER — SUTAB 1479-225-188 MG PO TABS
12.0000 | ORAL_TABLET | ORAL | 0 refills | Status: DC
Start: 2023-12-09 — End: 2024-01-01

## 2023-12-09 NOTE — Progress Notes (Addendum)
 Pt's name and DOB verified at the beginning of the pre-visit wit 2 identifiers  Pt denies any difficulty with ambulating,sitting, laying down or rolling side to side  Pt has no issues with ambulation   Pt has no issues moving head neck or swallowing  No egg or soy allergy known to patient   No issues known to pt with past sedation with any surgeries or procedures  Pt denies having issues being intubated  No FH of Malignant Hyperthermia  Pt is not on diet pills or shots  Pt is not on home 02   Pt is not on blood thinners   Pt has frequent issues with constipation RN instructed pt to use Miralax per bottles instructions a week before prep days. Pt states they will  Pt is not on dialysis  Pt denise any abnormal heart rhythms   Pt denies any upcoming cardiac testing  Patient's chart reviewed by Cathlyn Parsons prior to pre-visit and patient appropriate for the [facility].  Pre-visit completed and red dot placed by patient's name on their procedure day (on provider's schedule).    Visit by phone  Pt states weight is 136 lb  IInstructions reviewed. Pt given , [facility] main # and MD on call # prior to instructions.  Pt states understanding of instructions. Instructed to review again prior to procedure. Pt states they will.

## 2023-12-30 ENCOUNTER — Encounter: Payer: Self-pay | Admitting: Gastroenterology

## 2024-01-01 ENCOUNTER — Encounter: Payer: Self-pay | Admitting: Gastroenterology

## 2024-01-01 ENCOUNTER — Ambulatory Visit (AMBULATORY_SURGERY_CENTER): Admitting: Gastroenterology

## 2024-01-01 VITALS — BP 98/66 | HR 69 | Temp 97.8°F | Resp 15

## 2024-01-01 DIAGNOSIS — Z1211 Encounter for screening for malignant neoplasm of colon: Secondary | ICD-10-CM | POA: Diagnosis present

## 2024-01-01 DIAGNOSIS — K648 Other hemorrhoids: Secondary | ICD-10-CM | POA: Diagnosis not present

## 2024-01-01 MED ORDER — SODIUM CHLORIDE 0.9 % IV SOLN: 500.0000 mL | INTRAVENOUS | Status: DC

## 2024-01-01 NOTE — Patient Instructions (Addendum)
-  Handout on hemorrhoids provided -repeat colonoscopy for surveillance in 10 years recommended -Continue present medications    YOU HAD AN ENDOSCOPIC PROCEDURE TODAY AT THE [facility]:   Refer to the procedure report that was given to you for any specific questions about what was found during the examination.  If the procedure report does not answer your questions, please call your gastroenterologist to clarify.  If you requested that your care partner not be given the details of your procedure findings, then the procedure report has been included in a sealed envelope for you to review at your convenience later.  YOU SHOULD EXPECT: Some feelings of bloating in the abdomen. Passage of more gas than usual.  Walking can help get rid of the air that was put into your GI tract during the procedure and reduce the bloating. If you had a lower endoscopy (such as a colonoscopy or flexible sigmoidoscopy) you may notice spotting of blood in your stool or on the toilet paper. If you underwent a bowel prep for your procedure, you may not have a normal bowel movement for a few days.  Please Note:  You might notice some irritation and congestion in your nose or some drainage.  This is from the oxygen used during your procedure.  There is no need for concern and it should clear up in a day or so.  SYMPTOMS TO REPORT IMMEDIATELY:  Following lower endoscopy (colonoscopy or flexible sigmoidoscopy):  Excessive amounts of blood in the stool  Significant tenderness or worsening of abdominal pains  Swelling of the abdomen that is new, acute  Fever of 100F or higher  For urgent or emergent issues, a gastroenterologist can be reached at any hour by calling [phone]. Do not use MyChart messaging for urgent concerns.    DIET:  We do recommend a small meal at first, but then you may proceed to your regular diet.  Drink plenty of fluids but you should avoid alcoholic beverages for 24 hours.  ACTIVITY:   You should plan to take it easy for the rest of today and you should NOT DRIVE or use heavy machinery until tomorrow (because of the sedation medicines used during the test).    FOLLOW UP: Our staff will call the number listed on your records the next business day following your procedure.  We will call around 7:15- 8:00 am to check on you and address any questions or concerns that you may have regarding the information given to you following your procedure. If we do not reach you, we will leave a message.     If any biopsies were taken you will be contacted by phone or by letter within the next 1-3 weeks.  Please call us at [phone] if you have not heard about the biopsies in 3 weeks.    SIGNATURES/CONFIDENTIALITY: You and/or your care partner have signed paperwork which will be entered into your electronic medical record.  These signatures attest to the fact that that the information above on your After Visit Summary has been reviewed and is understood.  Full responsibility of the confidentiality of this discharge information lies with you and/or your care-partner.

## 2024-01-01 NOTE — Progress Notes (Signed)
 [facility] History and Physical   Primary Care Physician:  Cleave Curling, MD   Reason for Procedure:  Colorectal cancer screening  Plan:    Screening colonoscopy with possible interventions as needed     HPI: Virginia Mcdonald is a very pleasant 48 y.o. female here for screening colonoscopy. Denies any nausea, vomiting, abdominal pain, melena or bright red blood per rectum  The risks and benefits as well as alternatives of endoscopic procedure(s) have been discussed and reviewed. All questions answered. The patient agrees to proceed.    Past Medical History:  Diagnosis Date   Diet-controlled diabetes mellitus (HCC)    type 2   Fibroids     Past Surgical History:  Procedure Laterality Date   CESAREAN SECTION  2003   ROBOTIC ASSISTED LAPAROSCOPIC HYSTERECTOMY AND SALPINGECTOMY Bilateral 10/23/2020   Procedure: XI ROBOTIC ASSISTED LAPAROSCOPIC HYSTERECTOMY AND BILATERAL SALPINGECTOMY;  Surgeon: Arlee Lace, MD;  Location: [facility];  Service: Gynecology;  Laterality: Bilateral;    Prior to Admission medications   Medication Sig Start Date End Date Taking? Authorizing Provider  dapagliflozin  propanediol (FARXIGA ) 10 MG TABS tablet Take 1 tablet (10 mg total) by mouth daily before breakfast. 11/09/23  Yes Cleave Curling, MD  ezetimibe  (ZETIA ) 10 MG tablet Take 1 tablet (10 mg total) by mouth daily. 11/09/23  Yes Cleave Curling, MD  Accu-Chek FastClix Lancets MISC Use as directed to check blood sugars 1 time per day dx: e11.65 03/13/21   Cleave Curling, MD  aspirin  EC 81 MG tablet Take 1 tablet (81 mg total) by mouth daily. Swallow whole. 04/18/23   Leona Rake, MD  Blood Glucose Monitoring Suppl (BLOOD GLUCOSE MONITOR SYSTEM) w/Device KIT 1 each by Does not apply route in the morning, at noon, and at bedtime. 04/17/23   Adhikari, Amrit, MD  cholecalciferol (VITAMIN D3) 25 MCG (1000 UNIT) tablet Take 1,000 Units by mouth daily.    [provider]  Continuous Glucose Sensor (FREESTYLE LIBRE 3 SENSOR) MISC USE TO CHECK BLOOD SUGAR THREE TIMES DAILY 10/08/23   Cleave Curling, MD  glucose blood (ACCU-CHEK GUIDE) test strip Use as instructed to check blood sugars 1 time per day dx: e11.65 05/28/21   Cleave Curling, MD  mefloquine  (LARIAM ) 250 MG tablet Please take po once weekly, start one week prior to travel (09/15/23) and continue for four weeks after you return Patient not taking: Reported on 01/01/2024 09/10/23   Cleave Curling, MD  Omega-3 Fatty Acids (OMEGA 3 500 PO) Take by mouth.    [provider]  Prenatal Vit-Fe Fumarate-FA (PRENATAL VITAMINS PO) Take by mouth.    [provider]    Current Outpatient Medications  Medication Sig Dispense Refill   dapagliflozin  propanediol (FARXIGA ) 10 MG TABS tablet Take 1 tablet (10 mg total) by mouth daily before breakfast. 90 tablet 1   ezetimibe  (ZETIA ) 10 MG tablet Take 1 tablet (10 mg total) by mouth daily. 90 tablet 2   Accu-Chek FastClix Lancets MISC Use as directed to check blood sugars 1 time per day dx: e11.65 50 each 11   aspirin  EC 81 MG tablet Take 1 tablet (81 mg total) by mouth daily. Swallow whole. 120 tablet 1   Blood Glucose Monitoring Suppl (BLOOD GLUCOSE MONITOR SYSTEM) w/Device KIT 1 each by Does not apply route in the morning, at noon, and at bedtime. 1 kit 0   cholecalciferol (VITAMIN D3) 25 MCG (1000 UNIT) tablet Take 1,000 Units by mouth daily.  Continuous Glucose Sensor (FREESTYLE LIBRE 3 SENSOR) MISC USE TO CHECK BLOOD SUGAR THREE TIMES DAILY 3 each 0   glucose blood (ACCU-CHEK GUIDE) test strip Use as instructed to check blood sugars 1 time per day dx: e11.65 50 each 11   mefloquine  (LARIAM ) 250 MG tablet Please take po once weekly, start one week prior to travel (09/15/23) and continue for four weeks after you return (Patient not taking: Reported on 01/01/2024) 10 tablet 0   Omega-3 Fatty Acids (OMEGA 3 500 PO) Take by mouth.     Prenatal Vit-Fe  Fumarate-FA (PRENATAL VITAMINS PO) Take by mouth.     Current Facility-Administered Medications  Medication Dose Route Frequency Provider Last Rate Last Admin   0.9 %  sodium chloride  infusion  500 mL Intravenous Continuous Xolani Degracia V, MD        Allergies as of 01/01/2024 - Review Complete 01/01/2024  Allergen Reaction Noted   Penicillins Anaphylaxis 05/22/2013   Claritin-d 24 hour [loratadine-pseudoephedrine er] Other (See Comments) 12/23/2022   Loratadine-pseudoephedrine er Other (See Comments) 12/23/2022   Shellfish allergy Hives 04/14/2023   Other Hives 04/14/2023   Procardia [nifedipine] Palpitations 05/22/2013    Family History  Problem Relation Age of Onset   Diabetes Mother    Hypertension Mother    Hypertension Father    Hypertension Sister    Diabetes Sister    Healthy Brother    Healthy Brother    Healthy Brother    Healthy Daughter    Healthy Daughter    Healthy Daughter    Colon cancer Neg Hx    Colon polyps Neg Hx    Esophageal cancer Neg Hx    Rectal cancer Neg Hx    Stomach cancer Neg Hx     Social History   Socioeconomic History   Marital status: Married    Spouse name: Not on file   Number of children: Not on file   Years of education: Not on file   Highest education level: Not on file  Occupational History   Not on file  Tobacco Use   Smoking status: Never   Smokeless tobacco: Never  Vaping Use   Vaping status: Never Used  Substance and Sexual Activity   Alcohol use: Never   Drug use: Never   Sexual activity: Yes    Birth control/protection: Surgical  Other Topics Concern   Not on file  Social History Narrative   Not on file   Social Drivers of Health   Financial Resource Strain: Not on file  Food Insecurity: Low Risk  (05/08/2023)   Received from [facility]   Hunger Vital Sign    Worried About Running Out of Food in the Last Year: Never true    Ran Out of Food in the Last Year: Never true  Transportation Needs: No  Transportation Needs (05/08/2023)   Received from [facility]    In the past 12 months, has lack of reliable transportation kept you from medical appointments, meetings, work or from getting things needed for daily living? : No  Physical Activity: Not on file  Stress: Not on file  Social Connections: Not on file  Intimate Partner Violence: Not At Risk (04/16/2023)   Humiliation, Afraid, Rape, and Kick questionnaire    Fear of Current or Ex-Partner: No    Emotionally Abused: No    Physically Abused: No    Sexually Abused: No    Review of Systems:  All other review of systems negative  except as mentioned in the HPI.  Physical Exam: Vital signs in last 24 hours: Temp 97.8 F (36.6 C)   LMP 10/23/2020 Comment: S/P hysterectomy  General:   Alert, NAD Lungs:  Clear .   Heart:  Regular rate and rhythm Abdomen:  Soft, nontender and nondistended. Neuro/Psych:  Alert and cooperative. Normal mood and affect. A and O x 3  Reviewed labs, radiology imaging, old records and pertinent past GI work up  Patient is appropriate for planned procedure(s) and anesthesia in an ambulatory setting   K. Veena Alexxa Sabet , MD [phone]

## 2024-01-01 NOTE — Progress Notes (Signed)
 Pt's states no medical or surgical changes since previsit or office visit.

## 2024-01-01 NOTE — Progress Notes (Signed)
 Report to PACU, RN, vss, BBS= Clear.

## 2024-01-01 NOTE — Op Note (Signed)
 [facility] Patient Name: Virginia Mcdonald Procedure Date: 01/01/2024 9:08 AM MRN: [id number] Endoscopist: Sergio Dandy , MD, [id number] Age: 48 Referring MD:  Date of Birth: [birth year] Gender: Female Account #: [id number] Procedure:                Colonoscopy Indications:              Screening for colorectal malignant neoplasm Medicines:                Monitored Anesthesia Care Procedure:                Pre-Anesthesia Assessment:                           - Prior to the procedure, a History and Physical                            was performed, and patient medications and                            allergies were reviewed. The patient's tolerance of                            previous anesthesia was also reviewed. The risks                            and benefits of the procedure and the sedation                            options and risks were discussed with the patient.                            All questions were answered, and informed consent                            was obtained. Prior Anticoagulants: The patient has                            taken no anticoagulant or antiplatelet agents. ASA                            Grade Assessment: II - A patient with mild systemic                            disease. After reviewing the risks and benefits,                            the patient was deemed in satisfactory condition to                            undergo the procedure.                           After obtaining informed consent, the colonoscope  was passed under direct vision. Throughout the                            procedure, the patient's blood pressure, pulse, and                            oxygen saturations were monitored continuously. The                            Olympus Scope SN: [id number] was introduced through                            the anus and advanced to the the cecum, identified                            by  appendiceal orifice and ileocecal valve. The                            colonoscopy was performed without difficulty. The                            patient tolerated the procedure well. The quality                            of the bowel preparation was good. The ileocecal                            valve, appendiceal orifice, and rectum were                            photographed. Scope In: 9:23:44 AM Scope Out: 9:34:58 AM Scope Withdrawal Time: 0 hours 7 minutes 25 seconds  Total Procedure Duration: 0 hours 11 minutes 14 seconds  Findings:                 The perianal and digital rectal examinations were                            normal.                           Non-bleeding internal hemorrhoids were found during                            retroflexion. The hemorrhoids were small.                           The exam was otherwise without abnormality. Complications:            No immediate complications. Estimated Blood Loss:     Estimated blood loss was minimal. Impression:               - Non-bleeding internal hemorrhoids.                           - The examination was otherwise normal.                           -  No specimens collected. Recommendation:           - Patient has a contact number available for                            emergencies. The signs and symptoms of potential                            delayed complications were discussed with the                            patient. Return to normal activities tomorrow.                            Written discharge instructions were provided to the                            patient.                           - Resume previous diet.                           - Continue present medications.                           - Repeat colonoscopy in 10 years for surveillance. Shimshon Narula V. Kensleigh Gates, MD 01/01/2024 9:42:54 AM This report has been signed electronically.

## 2024-01-04 ENCOUNTER — Telehealth: Payer: Self-pay

## 2024-01-04 NOTE — Telephone Encounter (Signed)
  Follow up Call-     01/01/2024    8:36 AM  Call back number  Post procedure Call Back phone  # [phone]  Permission to leave phone message Yes     Patient questions:  Do you have a fever, pain , or abdominal swelling? No. Pain Score  0 *  Have you tolerated food without any problems? Yes.    Have you been able to return to your normal activities? Yes.    Do you have any questions about your discharge instructions: Diet   No. Medications  No. Follow up visit  No.  Do you have questions or concerns about your Care? No.  Actions: * If pain score is 4 or above: No action needed, pain <4.

## 2024-01-07 ENCOUNTER — Encounter: Payer: Self-pay | Admitting: Internal Medicine

## 2024-01-07 ENCOUNTER — Ambulatory Visit (INDEPENDENT_AMBULATORY_CARE_PROVIDER_SITE_OTHER): Admitting: Internal Medicine

## 2024-01-07 VITALS — BP 110/74 | HR 73 | Temp 98.1°F | Ht 63.0 in | Wt 141.8 lb

## 2024-01-07 DIAGNOSIS — E7800 Pure hypercholesterolemia, unspecified: Secondary | ICD-10-CM

## 2024-01-07 DIAGNOSIS — Z23 Encounter for immunization: Secondary | ICD-10-CM | POA: Diagnosis not present

## 2024-01-07 DIAGNOSIS — Z2989 Encounter for other specified prophylactic measures: Secondary | ICD-10-CM | POA: Diagnosis not present

## 2024-01-07 DIAGNOSIS — E785 Hyperlipidemia, unspecified: Secondary | ICD-10-CM

## 2024-01-07 DIAGNOSIS — E1169 Type 2 diabetes mellitus with other specified complication: Secondary | ICD-10-CM

## 2024-01-07 MED ORDER — MEFLOQUINE HCL 250 MG PO TABS: ORAL_TABLET | ORAL | 0 refills | Status: DC

## 2024-01-07 MED ORDER — ATORVASTATIN CALCIUM 20 MG PO TABS: 20.0000 mg | ORAL_TABLET | Freq: Every day | ORAL | 11 refills | Status: DC

## 2024-01-07 NOTE — Patient Instructions (Signed)

## 2024-01-07 NOTE — Progress Notes (Unsigned)
 I,Virginia Mcdonald, CMA,acting as a Neurosurgeon for Virginia Dung, MD.,have documented all relevant documentation on the behalf of Virginia Dung, MD,as directed by  Virginia Dung, MD while in the presence of Virginia Dung, MD.  Subjective:  Patient ID: Virginia Mcdonald , female    DOB: September 14, 1975 , 48 y.o.   MRN: [id number]  Chief Complaint  Patient presents with  . Diabetes    Patient presents today for dm & cholesterol follow up. She reports compliance with medications. Denies headache, chest pain, & sob.  She requests a two week supply for Malaria pill. She is to travel to [place] on Saturday. She admits not taking Ezetimibe . She takes a cholesterol medication her husband has, Atorvastatin . She states that medication works better for her.    . Hyperlipidemia    HPI Discussed the use of AI scribe software for clinical note transcription with the patient, who gave verbal consent to proceed.  History of Present Illness Virginia Mcdonald is a 48 year old female with diabetes who presents for a diabetes check.  She has discontinued atorvastatin  due to myalgia and has resumed her previous cholesterol medication, which she describes as 'the old one' and 'the little bitty ones'. She has not experienced myalgia with this medication and requests a refill.  She has not initiated the diabetes injection as previously discussed and continues with her current medication, Farxiga . She was informed to continue Farxiga  upon returning from a trip to [place].  She is preparing for a mission trip to [place], departing on May 3rd and returning on May 16th. She is a week late in starting her malaria prophylaxis, which she will need for seven weeks in total.  She underwent a colonoscopy last Friday and was informed that she would not need another for ten years.  Her neck pain has resolved, which she attributes to massages performed by her husband. She is awaiting a neurology appointment scheduled for  June 18th and an OB/GYN appointment on June 17th.     Diabetes She presents for her follow-up diabetic visit. She has type 2 diabetes mellitus. Her disease course has been stable. There are no hypoglycemic associated symptoms. Pertinent negatives for hypoglycemia include no dizziness or headaches. Pertinent negatives for diabetes include no polydipsia, no polyphagia and no polyuria. There are no hypoglycemic complications. There are no diabetic complications. Risk factors for coronary artery disease include diabetes mellitus. (Blood sugars in am 89 -131 range. )  Hyperlipidemia This is a chronic problem. The current episode started more than 1 year ago. Exacerbating diseases include diabetes. Current antihyperlipidemic treatment includes ezetimibe . Risk factors for coronary artery disease include diabetes mellitus and dyslipidemia.     Past Medical History:  Diagnosis Date  . Diet-controlled diabetes mellitus (HCC)    type 2  . Fibroids      Family History  Problem Relation Age of Onset  . Diabetes Mother   . Hypertension Mother   . Hypertension Father   . Hypertension Sister   . Diabetes Sister   . Healthy Brother   . Healthy Brother   . Healthy Brother   . Healthy Daughter   . Healthy Daughter   . Healthy Daughter   . Colon cancer Neg Hx   . Colon polyps Neg Hx   . Esophageal cancer Neg Hx   . Rectal cancer Neg Hx   . Stomach cancer Neg Hx      Current Outpatient Medications:  .  Accu-Chek FastClix Lancets  MISC, Use as directed to check blood sugars 1 time per day dx: e11.65, Disp: 50 each, Rfl: 11 .  aspirin  EC 81 MG tablet, Take 1 tablet (81 mg total) by mouth daily. Swallow whole., Disp: 120 tablet, Rfl: 1 .  Blood Glucose Monitoring Suppl (BLOOD GLUCOSE MONITOR SYSTEM) w/Device KIT, 1 each by Does not apply route in the morning, at noon, and at bedtime., Disp: 1 kit, Rfl: 0 .  cholecalciferol (VITAMIN D3) 25 MCG (1000 UNIT) tablet, Take 1,000 Units by mouth daily.,  Disp: , Rfl:  .  Continuous Glucose Sensor (FREESTYLE LIBRE 3 SENSOR) MISC, USE TO CHECK BLOOD SUGAR THREE TIMES DAILY, Disp: 3 each, Rfl: 0 .  dapagliflozin  propanediol (FARXIGA ) 10 MG TABS tablet, Take 1 tablet (10 mg total) by mouth daily before breakfast., Disp: 90 tablet, Rfl: 1 .  glucose blood (ACCU-CHEK GUIDE) test strip, Use as instructed to check blood sugars 1 time per day dx: e11.65, Disp: 50 each, Rfl: 11 .  Omega-3 Fatty Acids (OMEGA 3 500 PO), Take by mouth., Disp: , Rfl:  .  Prenatal Vit-Fe Fumarate-FA (PRENATAL VITAMINS PO), Take by mouth., Disp: , Rfl:  .  ezetimibe  (ZETIA ) 10 MG tablet, Take 1 tablet (10 mg total) by mouth daily. (Patient not taking: Reported on 01/07/2024), Disp: 90 tablet, Rfl: 2 .  mefloquine  (LARIAM ) 250 MG tablet, Please take po once weekly, start one week prior to travel (09/15/23) and continue for four weeks after you return (Patient not taking: Reported on 12/09/2023), Disp: 10 tablet, Rfl: 0   Allergies  Allergen Reactions  . Penicillins Anaphylaxis  . Claritin-D 24 Hour [Loratadine-Pseudoephedrine Er] Other (See Comments)    Pt reports she ran a fever, became very fatigue.   . Loratadine-Pseudoephedrine Er Other (See Comments)    Pt reports she ran a fever, became very fatigue.  . Shellfish Allergy Hives  . Other Hives  . Procardia [Nifedipine] Palpitations     Review of Systems  Constitutional: Negative.   Respiratory: Negative.    Cardiovascular: Negative.   Gastrointestinal: Negative.   Endocrine: Negative for polydipsia, polyphagia and polyuria.  Neurological: Negative.  Negative for dizziness and headaches.  Psychiatric/Behavioral: Negative.       Today's Vitals   01/07/24 1212  BP: 110/74  Pulse: 73  Temp: 98.1 F (36.7 C)  SpO2: 98%  Weight: 141 lb 12.8 oz (64.3 kg)  Height: 5\' 3"  (1.6 m)   Body mass index is 25.12 kg/m.  Wt Readings from Last 3 Encounters:  01/07/24 141 lb 12.8 oz (64.3 kg)  12/09/23 136 lb (61.7 kg)   11/09/23 143 lb 9.6 oz (65.1 kg)     Objective:  Physical Exam Vitals and nursing note reviewed.  Constitutional:      Appearance: Normal appearance.  HENT:     Head: Normocephalic and atraumatic.  Eyes:     Extraocular Movements: Extraocular movements intact.  Cardiovascular:     Rate and Rhythm: Normal rate and regular rhythm.     Heart sounds: Normal heart sounds.  Pulmonary:     Effort: Pulmonary effort is normal.     Breath sounds: Normal breath sounds.  Musculoskeletal:     Cervical back: Normal range of motion.  Skin:    General: Skin is warm.  Neurological:     General: No focal deficit present.     Mental Status: She is alert.  Psychiatric:        Mood and Affect: Mood normal.  Behavior: Behavior normal.        Assessment And Plan:  Dyslipidemia associated with type 2 diabetes mellitus (HCC)  Pure hypercholesterolemia   Assessment & Plan Type 2 diabetes mellitus She has not started the diabetes injection due to travel. Currently on Farxiga  (dapagliflozin ). - Order A1c test to assess current diabetes control. - Continue Farxiga  (dapagliflozin ). - Reassess diabetes management post-travel.  Malaria prophylaxis Traveling to [place], requires immediate malaria prophylaxis. - Start malaria prophylaxis immediately and continue for seven weeks, including during travel and four weeks post-return.  Adverse effect of atorvastatin  Experienced myalgia with atorvastatin , discontinued. Reverted to previous cholesterol medication, well-tolerated. - Confirm current cholesterol medication and dosage via MyChart message. - Document current cholesterol medication in medical record.     Return for 4 month dm f/u.Aaron Aas  Patient was given opportunity to ask questions. Patient verbalized understanding of the plan and was able to repeat key elements of the plan. All questions were answered to their satisfaction.    I, Virginia Dung, MD, have reviewed all documentation  for this visit. The documentation on 01/07/24 for the exam, diagnosis, procedures, and orders are all accurate and complete.   IF YOU HAVE BEEN REFERRED TO A SPECIALIST, IT MAY TAKE 1-2 WEEKS TO SCHEDULE/PROCESS THE REFERRAL. IF YOU HAVE NOT HEARD FROM US /SPECIALIST IN TWO WEEKS, PLEASE GIVE US  A CALL AT [phone].   THE PATIENT IS ENCOURAGED TO PRACTICE SOCIAL DISTANCING DUE TO THE COVID-19 PANDEMIC.

## 2024-01-08 LAB — BMP8+EGFR
BUN/Creatinine Ratio: 16 (ref 9–23)
BUN: 14 mg/dL (ref 6–24)
CO2: 24 mmol/L (ref 20–29)
Calcium: 9.8 mg/dL (ref 8.7–10.2)
Chloride: 99 mmol/L (ref 96–106)
Creatinine, Ser: 0.9 mg/dL (ref 0.57–1.00)
Glucose: 109 mg/dL — ABNORMAL HIGH (ref 70–99)
Potassium: 4.3 mmol/L (ref 3.5–5.2)
Sodium: 138 mmol/L (ref 134–144)
eGFR: 80 mL/min/{1.73_m2} (ref >59)

## 2024-01-08 LAB — HEMOGLOBIN A1C
Est. average glucose Bld gHb Est-mCnc: 169 mg/dL
Hgb A1c MFr Bld: 7.5 % — ABNORMAL HIGH (ref 4.8–5.6)

## 2024-01-09 ENCOUNTER — Encounter: Payer: Self-pay | Admitting: Internal Medicine

## 2024-01-10 NOTE — Assessment & Plan Note (Signed)
 She has not started the diabetes injection, Ozempic due to travel. Currently on Farxiga  (dapagliflozin ). - Order A1c test to assess current diabetes control. - Continue Farxiga  (dapagliflozin ). - Reassess diabetes management post-travel. - LDL goal is less than 70, she states she has resumed atorvastatin 

## 2024-01-10 NOTE — Assessment & Plan Note (Signed)
 Traveling to [place], requires immediate malaria prophylaxis. - Start malaria prophylaxis immediately and continue for seven weeks, including during travel and four weeks post-return.

## 2024-01-11 ENCOUNTER — Ambulatory Visit: Payer: Commercial Managed Care - HMO | Admitting: Internal Medicine

## 2024-01-26 ENCOUNTER — Encounter: Payer: Self-pay | Admitting: Internal Medicine

## 2024-01-26 ENCOUNTER — Ambulatory Visit: Admitting: Internal Medicine

## 2024-01-26 VITALS — BP 102/62 | HR 77 | Temp 98.6°F | Ht 63.0 in | Wt 141.8 lb

## 2024-01-26 DIAGNOSIS — E785 Hyperlipidemia, unspecified: Secondary | ICD-10-CM

## 2024-01-26 DIAGNOSIS — E663 Overweight: Secondary | ICD-10-CM

## 2024-01-26 DIAGNOSIS — E1169 Type 2 diabetes mellitus with other specified complication: Secondary | ICD-10-CM | POA: Diagnosis not present

## 2024-01-26 DIAGNOSIS — Z6825 Body mass index (BMI) 25.0-25.9, adult: Secondary | ICD-10-CM | POA: Diagnosis not present

## 2024-01-26 NOTE — Patient Instructions (Signed)
 Semaglutide Injection What is this medication? SEMAGLUTIDE (SEM a GLOO tide) treats type 2 diabetes. It works by increasing insulin levels in your body, which decreases your blood sugar (glucose). It also reduces the amount of sugar released into your blood and slows down your digestion. It may also be used to lower the risk of heart attack and stroke in people with type 2 diabetes. Changes to diet and exercise are often combined with this medication. This medicine may be used for other purposes; ask your health care provider or pharmacist if you have questions. COMMON BRAND NAME(S): OZEMPIC What should I tell my care team before I take this medication? They need to know if you have any of these conditions: Eye disease caused by diabetes Gallbladder disease Have or have had pancreatitis Having surgery Kidney disease Personal or family history of MEN 2, a condition that causes endocrine gland tumors Personal or family history of thyroid cancer Stomach or intestine problems, such as problems digesting food An unusual or allergic reaction to semaglutide, other medications, foods, dyes, or preservatives Pregnant or trying to get pregnant Breastfeeding How should I use this medication? This medication is for injection under the skin of your upper leg (thigh), stomach area, or upper arm. It is given once every week (every 7 days). You will be taught how to prepare and give this medication. Use exactly as directed. Take your medication at regular intervals. Do not take it more often than directed. If you use this medication with insulin, you should inject this medication and the insulin separately. Do not mix them together. Do not give the injections right next to each other. Change (rotate) injection sites with each injection. It is important that you put your used needles and syringes in a special sharps container. Do not put them in a trash can. If you do not have a sharps container, call your  pharmacist or care team to get one. A special MedGuide will be given to you by the pharmacist with each prescription and refill. Be sure to read this information carefully each time. This medication comes with INSTRUCTIONS FOR USE. Ask your pharmacist for directions on how to use this medication. Read the information carefully. Talk to your pharmacist or care team if you have questions. Talk to your care team about the use of this medication in children. Special care may be needed. Overdosage: If you think you have taken too much of this medicine contact a poison control center or emergency room at once. NOTE: This medicine is only for you. Do not share this medicine with others. What if I miss a dose? If you miss a dose, take it as soon as you can within 5 days after the missed dose. Then take your next dose at your regular weekly time. If it has been longer than 5 days after the missed dose, do not take the missed dose. Take the next dose at your regular time. Do not take double or extra doses. If you have questions about a missed dose, contact your care team for advice. What may interact with this medication? Some medications may affect your blood sugar levels or hide the symptoms of low blood sugar (hypoglycemia). Talk with your care team about all the medications you take. They may suggest changes to your insulin dose or checking your blood sugar levels more often. Medications that may affect your blood sugar levels include: Alcohol Certain antibiotics, such as ciprofloxacin, levofloxacin, sulfamethoxazole; trimethoprim Certain medications for blood pressure or heart  disease, such as benazepril, enalapril, lisinopril, losartan, valsartan Certain medications for mental health conditions, such as fluoxetine or olanzapine Diuretics, such as hydrochlorothiazide (HCTZ) Estrogen and progestin hormones Other medications for diabetes Steroid medications, such as prednisone or  cortisone Testosterone Thyroid hormones Medications that may mask symptoms of low blood sugar include: Beta blockers, such as atenolol, metoprolol, propranolol Clonidine Guanethidine Reserpine This list may not describe all possible interactions. Give your health care provider a list of all the medicines, herbs, non-prescription drugs, or dietary supplements you use. Also tell them if you smoke, drink alcohol, or use illegal drugs. Some items may interact with your medicine. What should I watch for while using this medication? Visit your care team for regular checks on your progress. Tell your care team if your symptoms do not start to get better or if they get worse. You may need blood work done while you are taking this medication. Your care team will monitor your HbA1C (A1C). This test shows what your average blood sugar (glucose) level was over the past 2 to 3 months. Know the symptoms of low blood sugar and know how to treat it. Always carry a source of quick sugar with you. Examples include hard sugar candy or glucose tablets. Make sure others know that you can choke if you eat or drink if your blood sugar is too low and you are unable to care for yourself. Get medical help at once. Tell your care team if you have high blood sugar. Your medication dose may change if your body is under stress. Some types of stress that may affect your blood sugar include fever, infection, and surgery. Do not share pens or cartridges with anyone, even if the needle is changed. Each pen should only be used by one person. Sharing could cause an infection. Wear a medical ID bracelet or chain. Carry a card that describes your condition. List the medications and doses you take on the card. Talk to your care team about your risk of cancer. You may be more at risk for certain types of cancer if you take this medication. Talk to your care team right away if you have a lump or swelling in your neck, hoarseness that does  not go away, trouble swallowing, shortness of breath, or trouble breathing. Make sure you stay hydrated while taking this medication. Drink water often. Eat fruits and veggies that have a high water content. Drink more water when it is hot or you are active. Talk to your care team right away if you have fever, infection, vomiting, diarrhea, or if you sweat a lot while taking this medication. The loss of too much body fluid may make it dangerous for you to take this medication. If you are going to need surgery or a procedure, tell your care team that you are taking this medication. What side effects may I notice from receiving this medication? Side effects that you should report to your care team as soon as possible: Allergic reactions--skin rash, itching, hives, swelling of the face, lips, tongue, or throat Change in vision Dehydration--increased thirst, dry mouth, feeling faint or lightheaded, headache, dark yellow or brown urine Gallbladder problems--severe stomach pain, nausea, vomiting, fever Heart palpitations--rapid, pounding, or irregular heartbeat Kidney injury--decrease in the amount of urine, swelling of the ankles, hands, or feet Pancreatitis--severe stomach pain that spreads to your back or gets worse after eating or when touched, fever, nausea, vomiting Thoughts of suicide or self-harm, worsening mood, feelings of depression Thyroid cancer--new mass  or lump in the neck, pain or trouble swallowing, trouble breathing, hoarseness Side effects that usually do not require medical attention (report these to your care team if they continue or are bothersome): Diarrhea Loss of appetite Nausea Upset stomach This list may not describe all possible side effects. Call your doctor for medical advice about side effects. You may report side effects to FDA at 1-800-FDA-1088. Where should I keep my medication? Keep out of the reach of children. Store unopened pens in a refrigerator between 2 and 8  degrees C (36 and 46 degrees F). Do not freeze. Protect from light and heat. After you first use the pen, it can be stored for 56 days at room temperature between 15 and 30 degrees C (59 and 86 degrees F) or in a refrigerator. Throw away your used pen after 56 days or after the expiration date, whichever comes first. Do not store your pen with the needle attached. If the needle is left on, medication may leak from the pen. NOTE: This sheet is a summary. It may not cover all possible information. If you have questions about this medicine, talk to your doctor, pharmacist, or health care provider.  2024 [employer]/Gold Standard (2023-08-07 00:00:00)

## 2024-01-26 NOTE — Progress Notes (Signed)
 I,Victoria T Emmitt, CMA,acting as a neurosurgeon for [name], MD.,have documented all relevant documentation on the behalf of [name], MD,as directed by  [name], MD while in the presence of [name], MD.  Subjective:  Patient ID: Virginia Mcdonald , female    DOB: March 02, 1976 , 48 y.o.   MRN: [id number]  Chief Complaint  Patient presents with   Diabetes    Patient presents today for diabetes follow up. She was told at previous visit to give the office a call once she came back from out of town & to make an appointment for Ozempic  teaching. She reports compliance with Farxiga , she did take medication this morning before coming in. Denies headache, chest pain & sob.    HPI Discussed the use of AI scribe software for clinical note transcription with the patient, who gave verbal consent to proceed.  History of Present Illness Virginia Mcdonald is a 48 year old female with diabetes who presents for initiation of Ozempic  therapy.  She is starting Ozempic  to manage her diabetes and improve her A1c levels. Her last A1c was checked on May 1st, and she is scheduled for a follow-up diabetes check in early September, with a possibility of moving it to August depending on her progress.  Her current medication regimen includes Farxiga , which she takes daily. She will now add Ozempic , administered once a week. She has been informed about the storage requirements for the medication.  She has already eaten breakfast today before the visit. She enjoys eating vegetables and rarely consumes potatoes.   Diabetes She presents for her follow-up diabetic visit. She has type 2 diabetes mellitus. Her disease course has been stable. There are no hypoglycemic associated symptoms. Pertinent negatives for hypoglycemia include no dizziness or headaches. Pertinent negatives for diabetes include no polydipsia, no polyphagia and no polyuria. There are no hypoglycemic complications. There are  no diabetic complications. Risk factors for coronary artery disease include diabetes mellitus. (Blood sugars in am 89 -131 range. )  Hyperlipidemia This is a chronic problem. The current episode started more than 1 year ago. Exacerbating diseases include diabetes. Current antihyperlipidemic treatment includes ezetimibe . Risk factors for coronary artery disease include diabetes mellitus and dyslipidemia.     Past Medical History:  Diagnosis Date   Diet-controlled diabetes mellitus (HCC)    type 2   Fibroids      Family History  Problem Relation Age of Onset   Diabetes Mother    Hypertension Mother    Hypertension Father    Hypertension Sister    Diabetes Sister    Healthy Brother    Healthy Brother    Healthy Brother    Healthy Daughter    Healthy Daughter    Healthy Daughter    Colon cancer Neg Hx    Colon polyps Neg Hx    Esophageal cancer Neg Hx    Rectal cancer Neg Hx    Stomach cancer Neg Hx      Current Outpatient Medications:    Accu-Chek FastClix Lancets MISC, Use as directed to check blood sugars 1 time per day dx: e11.65, Disp: 50 each, Rfl: 11   aspirin  EC 81 MG tablet, Take 1 tablet (81 mg total) by mouth daily. Swallow whole., Disp: 120 tablet, Rfl: 1   atorvastatin  (LIPITOR) 20 MG tablet, Take 1 tablet (20 mg total) by mouth daily., Disp: 30 tablet, Rfl: 11   Blood Glucose Monitoring Suppl (BLOOD GLUCOSE MONITOR SYSTEM) w/Device KIT,  1 each by Does not apply route in the morning, at noon, and at bedtime., Disp: 1 kit, Rfl: 0   cholecalciferol (VITAMIN D3) 25 MCG (1000 UNIT) tablet, Take 1,000 Units by mouth daily., Disp: , Rfl:    Continuous Glucose Sensor (FREESTYLE LIBRE 3 SENSOR) MISC, USE TO CHECK BLOOD SUGAR THREE TIMES DAILY, Disp: 3 each, Rfl: 0   dapagliflozin  propanediol (FARXIGA ) 10 MG TABS tablet, Take 1 tablet (10 mg total) by mouth daily before breakfast., Disp: 90 tablet, Rfl: 1   glucose blood (ACCU-CHEK GUIDE) test strip, Use as instructed to  check blood sugars 1 time per day dx: e11.65, Disp: 50 each, Rfl: 11   mefloquine  (LARIAM ) 250 MG tablet, Please take po once weekly,, Disp: 7 tablet, Rfl: 0   Omega-3 Fatty Acids (OMEGA 3 500 PO), Take by mouth., Disp: , Rfl:    Prenatal Vit-Fe Fumarate-FA (PRENATAL VITAMINS PO), Take by mouth., Disp: , Rfl:    Allergies  Allergen Reactions   Penicillins Anaphylaxis   Claritin-D 24 Hour [Loratadine-Pseudoephedrine Er] Other (See Comments)    Pt reports she ran a fever, became very fatigue.    Loratadine-Pseudoephedrine Er Other (See Comments)    Pt reports she ran a fever, became very fatigue.   Shellfish Allergy Hives   Other Hives   Procardia [Nifedipine] Palpitations     Review of Systems  Constitutional: Negative.   Respiratory: Negative.    Cardiovascular: Negative.   Gastrointestinal: Negative.   Endocrine: Negative for polydipsia, polyphagia and polyuria.  Neurological: Negative.  Negative for dizziness and headaches.  Psychiatric/Behavioral: Negative.       Today's Vitals   01/26/24 1037  BP: 102/62  Pulse: 77  Temp: 98.6 F (37 C)  SpO2: 98%  Weight: 141 lb 12.8 oz (64.3 kg)  Height: 5' 3 (1.6 m)   Body mass index is 25.12 kg/m.  Wt Readings from Last 3 Encounters:  01/26/24 141 lb 12.8 oz (64.3 kg)  01/07/24 141 lb 12.8 oz (64.3 kg)  12/09/23 136 lb (61.7 kg)     Objective:  Physical Exam Vitals and nursing note reviewed.  Constitutional:      Appearance: Normal appearance.  HENT:     Head: Normocephalic and atraumatic.  Eyes:     Extraocular Movements: Extraocular movements intact.  Cardiovascular:     Rate and Rhythm: Normal rate and regular rhythm.     Heart sounds: Normal heart sounds.  Pulmonary:     Effort: Pulmonary effort is normal.     Breath sounds: Normal breath sounds.  Musculoskeletal:     Cervical back: Normal range of motion.  Skin:    General: Skin is warm.  Neurological:     General: No focal deficit present.     Mental  Status: She is alert.  Psychiatric:        Mood and Affect: Mood normal.        Behavior: Behavior normal.       Assessment And Plan:  Dyslipidemia associated with type 2 diabetes mellitus (HCC) Assessment & Plan: Type 2 diabetes mellitus with elevated A1c. Initiated Ozempic  for glycemic control and cardiovascular/renal protection. Emphasized lifestyle modifications to prevent muscle atrophy and optimize protein intake. Explained Ozempic 's benefits and administration. - Start Ozempic  once weekly. - Continue Farxiga  daily. - Educate on satiety cues and regular exercise. - Schedule A1c recheck in August. - Provide Ozempic  administration teaching. - Ensure proper storage of Ozempic , take out of fridge 15 minutes before use if refrigerated.  Overweight with body mass index (BMI) of 25 to 25.9 in adult Assessment & Plan: She is encouraged to aim for at least 150 minutes of exercise per week. Importance of adequate protein intake and strength training while on Ozempic  was discussed with the patient.     Return move Sept visit to August, after 15th Dm check.  Patient was given opportunity to ask questions. Patient verbalized understanding of the plan and was able to repeat key elements of the plan. All questions were answered to their satisfaction.   I, [name], MD, have reviewed all documentation for this visit. The documentation on 01/26/24 for the exam, diagnosis, procedures, and orders are all accurate and complete.   IF YOU HAVE BEEN REFERRED TO A SPECIALIST, IT MAY TAKE 1-2 WEEKS TO SCHEDULE/PROCESS THE REFERRAL. IF YOU HAVE NOT HEARD FROM US /SPECIALIST IN TWO WEEKS, PLEASE GIVE US  A CALL AT [phone].   THE PATIENT IS ENCOURAGED TO PRACTICE SOCIAL DISTANCING DUE TO THE COVID-19 PANDEMIC.

## 2024-02-03 DIAGNOSIS — Z6825 Body mass index (BMI) 25.0-25.9, adult: Secondary | ICD-10-CM | POA: Insufficient documentation

## 2024-02-03 NOTE — Assessment & Plan Note (Signed)
 Type 2 diabetes mellitus with elevated A1c. Initiated Ozempic for glycemic control and cardiovascular/renal protection. Emphasized lifestyle modifications to prevent muscle atrophy and optimize protein intake. Explained Ozempic's benefits and administration. - Start Ozempic once weekly. - Continue Farxiga  daily. - Educate on satiety cues and regular exercise. - Schedule A1c recheck in August. - Provide Ozempic administration teaching. - Ensure proper storage of Ozempic, take out of fridge 15 minutes before use if refrigerated.

## 2024-02-03 NOTE — Assessment & Plan Note (Signed)
 She is encouraged to aim for at least 150 minutes of exercise per week. Importance of adequate protein intake and strength training while on Ozempic was discussed with the patient.

## 2024-02-18 ENCOUNTER — Other Ambulatory Visit: Payer: Self-pay

## 2024-02-18 MED ORDER — SEMAGLUTIDE(0.25 OR 0.5MG/DOS) 2 MG/3ML ~~LOC~~ SOPN: PEN_INJECTOR | SUBCUTANEOUS | 1 refills | Status: DC

## 2024-02-18 MED ORDER — DEXCOM G7 SENSOR MISC: 2 refills | Status: AC

## 2024-02-22 ENCOUNTER — Telehealth: Payer: Self-pay | Admitting: Neurology

## 2024-02-22 NOTE — Telephone Encounter (Signed)
 Appointment details confirmed

## 2024-02-23 NOTE — Progress Notes (Signed)
 [facility]  PATIENT: Virginia Mcdonald DOB: [birth year]  REFERRING DOCTOR OR PCP: Cleave Curling, MD SOURCE: Patient, notes from primary care, notes from August 2024 hospital admission, multiple imaging and lab reports, CT and MRI images personally reviewed and interpreted  _________________________________   HISTORICAL  CHIEF COMPLAINT:  Chief Complaint  Patient presents with   New Patient (Initial Visit)    Pt in room 10. Internal referral for Amaurosis fugax. Pt stared on Ozempic  5 weeks in now, noticed blurry vision when starting medication. Pt reports she never mentioned blurry vision to PCP.  Pt reports a couple months ago she loss vision in both eyes.    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   She went to the ED.   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   The neck pain improved but still had some episodes of milder left neck pain and sometimes headache.  The visual loss did not recur.    She was d/c on ASA plus Plavix  x 3 months, then ASA alone.     Note:  She reports symptoms were left sided but discharge note from 04/2023 report right sided neck pain.    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     She had a repeat CTA 10/2023 and it showed changes c/w LEFT vertebral dissection.  She denies any new symptoms  She denied any chiropractic manipulation, whiplash or other jarring neck movements.  She gets some massages but there are no sudden jerks on neck.     Currently, she reports some left neck pain that comes and goes.  This  sometimes causes a headache.    Vision is fine for her now.       Vascular risk factors: Hypertension, diabetes mellitus, hyperlipidemia  She recently started OZempic  and noted mild blurry vision (very mild - not NAION symptoms)   Imaging: MRI of the head 04/15/2023 was normal.  Incidental note of a partially empty sella turcica (pituitary has reduced height within a normal-sized sella turcica).  This is likely incidental.  MRI of the orbits 04/15/2023 was normal  CT angiogram of the head and neck 04/16/2023 shows irregular narrowing of the RIGHT V1 segment between C7 and C4  CT angiogram of the head and neck 10/29/2023 now shows fairly normal flow within the right vertebral artery.  However, there is now a regular narrowing of the LEFT vertebral artery at distal V1 to the C2 level which could be consistent with dissection  Labs:   11/08/2023 TChol 149; LDL 77       04/2023 was TChol 242 and LDL 165.  REVIEW OF SYSTEMS: Constitutional: No fevers, chills, sweats, or change in appetite Eyes: No visual changes, double vision, eye pain Ear, nose and throat: No hearing loss, ear pain, nasal congestion, sore throat Cardiovascular: No chest pain, palpitations Respiratory:  No shortness of breath at rest or with exertion.   No wheezes GastrointestinaI: No nausea, vomiting, diarrhea, abdominal pain, fecal incontinence Genitourinary:  No dysuria, urinary retention or frequency.  No nocturia. Musculoskeletal:  No neck pain, back pain Integumentary: No rash, pruritus, skin lesions  Neurological: as above Psychiatric: No depression at this time.  No anxiety Endocrine: No palpitations, diaphoresis, change in appetite, change in weigh or increased thirst Hematologic/Lymphatic:  No anemia, purpura, petechiae. Allergic/Immunologic: No itchy/runny eyes, nasal congestion, recent allergic reactions, rashes  ALLERGIES: Allergies  Allergen Reactions   Penicillins Anaphylaxis   Claritin-D 24 Hour  [Loratadine-Pseudoephedrine Er] Other (See Comments)    Pt reports she ran a fever, became very fatigue.    Loratadine-Pseudoephedrine Er Other (See Comments)    Pt reports she ran a fever, became very fatigue.   Shellfish Allergy Hives   Other Hives   Procardia [Nifedipine] Palpitations    HOME MEDICATIONS:  Current Outpatient Medications:    Accu-Chek FastClix Lancets MISC, Use as directed to check blood sugars 1 time per day dx: e11.65, Disp: 50 each, Rfl: 11   aspirin  EC 81 MG tablet, Take 1 tablet (81 mg total) by mouth daily. Swallow whole., Disp: 120 tablet, Rfl: 1   atorvastatin  (LIPITOR) 20 MG tablet, Take 1 tablet (20 mg total) by mouth daily., Disp: 30 tablet, Rfl: 11   Blood Glucose Monitoring Suppl (BLOOD GLUCOSE MONITOR SYSTEM) w/Device KIT, 1 each by Does not apply route in the morning, at noon, and at bedtime., Disp: 1 kit, Rfl: 0   cholecalciferol (VITAMIN D3) 25 MCG (1000 UNIT) tablet, Take 1,000 Units by mouth daily., Disp: , Rfl:    dapagliflozin  propanediol (FARXIGA ) 10 MG TABS tablet, Take 1 tablet (10 mg total) by mouth daily before breakfast., Disp: 90 tablet, Rfl: 1   glucose blood (ACCU-CHEK GUIDE) test strip, Use as instructed to check blood sugars 1 time per day dx: e11.65, Disp: 50 each, Rfl: 11   Omega-3 Fatty Acids (OMEGA 3 500 PO), Take by mouth., Disp: , Rfl:    Semaglutide ,0.25 or 0.5MG /DOS, 2 MG/3ML SOPN, Inject 0.25mg  weekly, Disp: 3 mL, Rfl: 1   tiZANidine (ZANAFLEX) 4 MG tablet, 1/2 to 1 po up to tid prn, Disp: 30 tablet, Rfl: 0   Continuous Glucose Sensor (DEXCOM G7 SENSOR) MISC, Use daily to monitor blood sugar readings. (Patient not taking: Reported on 02/24/2024), Disp: 3 each, Rfl: 2   Continuous Glucose Sensor (FREESTYLE LIBRE 3 SENSOR) MISC, USE TO CHECK BLOOD SUGAR THREE TIMES DAILY (Patient not taking: Reported on 02/24/2024), Disp: 3 each, Rfl: 0   mefloquine  (LARIAM ) 250 MG tablet, Please take po once weekly, (Patient not taking: Reported on  02/24/2024), Disp: 7 tablet, Rfl: 0   Prenatal Vit-Fe Fumarate-FA (PRENATAL VITAMINS PO), Take by mouth. (Patient not taking: Reported on 02/24/2024), Disp: , Rfl:   PAST MEDICAL HISTORY: Past Medical History:  Diagnosis Date   Diet-controlled diabetes mellitus (HCC)    type 2   Fibroids     PAST SURGICAL HISTORY: Past Surgical History:  Procedure Laterality Date   CESAREAN SECTION  2003   ROBOTIC ASSISTED LAPAROSCOPIC HYSTERECTOMY AND SALPINGECTOMY Bilateral 10/23/2020   Procedure: XI ROBOTIC ASSISTED LAPAROSCOPIC HYSTERECTOMY AND BILATERAL SALPINGECTOMY;  Surgeon: Arlee Lace, MD;  Location: [facility] [place];  Service: Gynecology;  Laterality: Bilateral;    FAMILY HISTORY: Family History  Problem Relation Age of Onset   Diabetes Mother    Hypertension Mother    Hypertension Father    Hypertension Sister    Diabetes Sister    Healthy Brother    Healthy Brother    Healthy Brother    Healthy Daughter    Healthy Daughter    Healthy Daughter    Colon cancer Neg Hx  Colon polyps Neg Hx    Esophageal cancer Neg Hx    Rectal cancer Neg Hx    Stomach cancer Neg Hx     SOCIAL HISTORY: Social History   Socioeconomic History   Marital status: Married    Spouse name: Not on file   Number of children: Not on file   Years of education: Not on file   Highest education level: Not on file  Occupational History   Not on file  Tobacco Use   Smoking status: Never   Smokeless tobacco: Never  Vaping Use   Vaping status: Never Used  Substance and Sexual Activity   Alcohol use: Never   Drug use: Never   Sexual activity: Yes    Birth control/protection: Surgical  Other Topics Concern   Not on file  Social History Narrative   Not on file   Social Drivers of Health   Financial Resource Strain: Not on file  Food Insecurity: Low Risk  (05/08/2023)   Received from [facility]   Hunger Vital Sign    Within the past 12 months, you worried that your food would run out  before you got money to buy more: Never true    Within the past 12 months, the food you bought just didn't last and you didn't have money to get more. : Never true  Transportation Needs: No Transportation Needs (05/08/2023)   Received from [facility]    In the past 12 months, has lack of reliable transportation kept you from medical appointments, meetings, work or from getting things needed for daily living? : No  Physical Activity: Not on file  Stress: Not on file  Social Connections: Not on file  Intimate Partner Violence: Not At Risk (04/16/2023)   Humiliation, Afraid, Rape, and Kick questionnaire    Fear of Current or Ex-Partner: No    Emotionally Abused: No    Physically Abused: No    Sexually Abused: No       PHYSICAL EXAM  Vitals:   02/24/24 1021  BP: 102/66  Pulse: 76  Weight: 134 lb 8 oz (61 kg)  Height: 5' 3 (1.6 m)    Body mass index is 23.83 kg/m.   General: The patient is well-developed and well-nourished and in no acute distress  HEENT:  Head is [place]/AT.  Sclera are anicteric.  Funduscopic exam shows normal optic discs and retinal vessels.  Neck: No carotid bruits are noted.  The neck is nontender.  Cardiovascular: The heart has a regular rate and rhythm with a normal S1 and S2. There were no murmurs, gallops or rubs.    Skin: Extremities are without rash or  edema.  Musculoskeletal:  Back is nontender  Neurologic Exam  Mental status: The patient is alert and oriented x 3 at the time of the examination. The patient has apparent normal recent and remote memory, with an apparently normal attention span and concentration ability.   Speech is normal.  Cranial nerves: Extraocular movements are full. Pupils are equal, round, and reactive to light and accomodation.  Visual fields are full.  Facial symmetry is present. There is good facial sensation to soft touch bilaterally.Facial strength is normal.  Trapezius and sternocleidomastoid strength  is normal. No dysarthria is noted.  The tongue is midline, and the patient has symmetric elevation of the soft palate. No obvious hearing deficits are noted.  Motor:  Muscle bulk is normal.   Tone is normal. Strength is  5 / 5  in all 4 extremities.   Sensory: Sensory testing is intact to pinprick, soft touch and vibration sensation in all 4 extremities.  Coordination: Cerebellar testing reveals good finger-nose-finger and heel-to-shin bilaterally.  Gait and station: Station is normal.   Gait is normal. Tandem gait is normal. Romberg is negative.   Reflexes: Deep tendon reflexes are symmetric and normal bilaterally.   Plantar responses are flexor.    DIAGNOSTIC DATA (LABS, IMAGING, TESTING) - I reviewed patient records, labs, notes, testing and imaging myself where available.  Lab Results  Component Value Date   WBC 4.7 11/02/2023   HGB 15.3 11/02/2023   HCT 46.2 11/02/2023   MCV 89 11/02/2023   PLT 151 11/02/2023      Component Value Date/Time   NA 138 01/07/2024 1242   K 4.3 01/07/2024 1242   CL 99 01/07/2024 1242   CO2 24 01/07/2024 1242   GLUCOSE 109 (H) 01/07/2024 1242   GLUCOSE 125 (H) 04/16/2023 2358   BUN 14 01/07/2024 1242   CREATININE 0.90 01/07/2024 1242   CALCIUM  9.8 01/07/2024 1242   PROT 7.4 11/02/2023 1117   ALBUMIN 4.4 11/02/2023 1117   AST 16 11/02/2023 1117   ALT 14 11/02/2023 1117   ALKPHOS 92 11/02/2023 1117   BILITOT 0.4 11/02/2023 1117   GFRNONAA >60 04/16/2023 2358   GFRAA 94 09/20/2020 1157   Lab Results  Component Value Date   CHOL 149 11/02/2023   HDL 61 11/02/2023   LDLCALC 77 11/02/2023   TRIG 54 11/02/2023   CHOLHDL 2.4 11/02/2023   Lab Results  Component Value Date   HGBA1C 7.5 (H) 01/07/2024   Lab Results  Component Value Date   VITAMINB12 1,086 02/24/2022   Lab Results  Component Value Date   TSH 0.780 09/10/2023       ASSESSMENT AND PLAN  Vertebral artery dissection (HCC)  Neck pain  Dyslipidemia associated  with type 2 diabetes mellitus (HCC)  Vision changes  Uncontrolled type 2 diabetes mellitus with hyperglycemia (HCC)    In summary, Virginia Mcdonald is a 48 year old woman currently reporting mild intermittent left-sided neck pain who had a vertebral artery dissection in August 2024 associated with several minutes of visual loss and severe neck pain.  Her exam is normal today.  Interestingly, she has reduced flow consistent with a right vertebral artery dissection on the August 2024 CT angiogram but the right vertebral artery looks good on the February 2025 CT angiogram but there is new narrowing on the left.  This does not appear to be a technical error.  However, in February 2025 she had no new symptoms.  She continues to just get intermittent neck pain on the left and denies pain on the right.  She has not had any further visual changes.    I advised her to avoid sudden jerking movements of the neck such as chiropractic manipulation, roller coaster or other activity that might risk reinjury.  Additionally, she should continue the aspirin .  I did not schedule a follow-up but she should call or message if she has any significant new or worsening neurologic symptoms.  However, if she has severe pain or acute neurologic symptoms worrisome for TIA/stroke she should go to the emergency room.  Thank you for asking me to see this patient.  Please let me know if I can be of further assistance to her or other patients in the future.     Myron Stankovich A. Godwin Lat, MD, [facility] 02/24/2024, 11:42 AM Certified in Neurology, Clinical  Neurophysiology, Sleep Medicine and Neuroimaging  [facility] [address] [place], [place] [address] [phone]

## 2024-02-24 ENCOUNTER — Encounter: Payer: Self-pay | Admitting: Neurology

## 2024-02-24 ENCOUNTER — Ambulatory Visit: Admitting: Neurology

## 2024-02-24 VITALS — BP 102/66 | HR 76 | Ht 63.0 in | Wt 134.5 lb

## 2024-02-24 DIAGNOSIS — H539 Unspecified visual disturbance: Secondary | ICD-10-CM

## 2024-02-24 DIAGNOSIS — E1169 Type 2 diabetes mellitus with other specified complication: Secondary | ICD-10-CM | POA: Diagnosis not present

## 2024-02-24 DIAGNOSIS — I7774 Dissection of vertebral artery: Secondary | ICD-10-CM

## 2024-02-24 DIAGNOSIS — M542 Cervicalgia: Secondary | ICD-10-CM | POA: Diagnosis not present

## 2024-02-24 DIAGNOSIS — Z7985 Long-term (current) use of injectable non-insulin antidiabetic drugs: Secondary | ICD-10-CM

## 2024-02-24 DIAGNOSIS — E785 Hyperlipidemia, unspecified: Secondary | ICD-10-CM

## 2024-02-24 DIAGNOSIS — E1165 Type 2 diabetes mellitus with hyperglycemia: Secondary | ICD-10-CM

## 2024-02-24 MED ORDER — TIZANIDINE HCL 4 MG PO TABS: ORAL_TABLET | ORAL | 0 refills | Status: DC

## 2024-02-29 ENCOUNTER — Telehealth: Admitting: Physician Assistant

## 2024-02-29 DIAGNOSIS — M5441 Lumbago with sciatica, right side: Secondary | ICD-10-CM | POA: Diagnosis not present

## 2024-02-29 DIAGNOSIS — M5442 Lumbago with sciatica, left side: Secondary | ICD-10-CM

## 2024-02-29 MED ORDER — NAPROXEN 500 MG PO TABS: 500.0000 mg | ORAL_TABLET | Freq: Two times a day (BID) | ORAL | 0 refills | Status: DC

## 2024-02-29 NOTE — Progress Notes (Signed)
 E-Visit for Back Pain   We are sorry that you are not feeling well.  Here is how we plan to help!  Based on what you have shared with me it looks like you mostly have acute back pain.  Acute back pain is defined as musculoskeletal pain that can resolve in 1-3 weeks with conservative treatment.  I have prescribed Naprosyn  500 mg take one by mouth twice a day non-steroid anti-inflammatory (NSAID) as well as Tizanidine 2 mg every eight hours as needed which is a muscle relaxer (take the prescription that was given on 02/24/24).  Some patients experience stomach irritation or in increased heartburn with anti-inflammatory drugs.  Please keep in mind that muscle relaxer's can cause fatigue and should not be taken while at work or driving.  Back pain is very common.  The pain often gets better over time.  The cause of back pain is usually not dangerous.  Most people can learn to manage their back pain on their own.  Home Care Stay active.  Start with short walks on flat ground if you can.  Try to walk farther each day. Do not sit, drive or stand in one place for more than 30 minutes.  Do not stay in bed. Do not avoid exercise or work.  Activity can help your back heal faster. Be careful when you bend or lift an object.  Bend at your knees, keep the object close to you, and do not twist. Sleep on a firm mattress.  Lie on your side, and bend your knees.  If you lie on your back, put a pillow under your knees. Only take medicines as told by your doctor. Put ice on the injured area. Put ice in a plastic bag Place a towel between your skin and the bag Leave the ice on for 15-20 minutes, 3-4 times a day for the first 2-3 days. 210 After that, you can switch between ice and heat packs. Ask your doctor about back exercises or massage. Avoid feeling anxious or stressed.  Find good ways to deal with stress, such as exercise.  Get Help Right Way If: Your pain does not go away with rest or medicine. Your pain  does not go away in 1 week. You have new problems. You do not feel well. The pain spreads into your legs. You cannot control when you poop (bowel movement) or pee (urinate) You feel sick to your stomach (nauseous) or throw up (vomit) You have belly (abdominal) pain. You feel like you may pass out (faint). If you develop a fever.  Make Sure you: Understand these instructions. Will watch your condition Will get help right away if you are not doing well or get worse.  Your e-visit answers were reviewed by a board certified advanced clinical practitioner to complete your personal care plan.  Depending on the condition, your plan could have included both over the counter or prescription medications.  If there is a problem please reply  once you have received a response from your provider.  Your safety is important to us .  If you have drug allergies check your prescription carefully.    You can use MyChart to ask questions about today's visit, request a non-urgent call back, or ask for a work or school excuse for 24 hours related to this e-Visit. If it has been greater than 24 hours you will need to follow up with your provider, or enter a new e-Visit to address those concerns.  You will get an  e-mail in the next two days asking about your experience.  I hope that your e-visit has been valuable and will speed your recovery. Thank you for using e-visits.    I have spent 5 minutes in review of e-visit questionnaire, review and updating patient chart, medical decision making and response to patient.   Delon CHRISTELLA Dickinson, PA-C

## 2024-03-07 ENCOUNTER — Telehealth: Payer: Self-pay

## 2024-03-10 ENCOUNTER — Ambulatory Visit: Admitting: Internal Medicine

## 2024-03-14 ENCOUNTER — Other Ambulatory Visit: Payer: Self-pay

## 2024-03-14 MED ORDER — SEMAGLUTIDE(0.25 OR 0.5MG/DOS) 2 MG/3ML ~~LOC~~ SOPN: PEN_INJECTOR | SUBCUTANEOUS | 1 refills | Status: DC

## 2024-04-05 ENCOUNTER — Ambulatory Visit: Payer: Self-pay | Admitting: Internal Medicine

## 2024-04-05 NOTE — Telephone Encounter (Signed)
 Copied from CRM [id number]. Topic: Clinical - Medication Question >> Apr 05, 2024 11:26 AM Myrick T wrote: Reason for CRM: patient called to see if there is any type of help or discounts she can get for Semaglutide ,0.25 or 0.5MG /DOS, 2 MG/3ML. Patient says the pharmacy advised her it would be $700 for the script. Please f/u with patient.

## 2024-04-07 ENCOUNTER — Encounter: Payer: Self-pay | Admitting: Internal Medicine

## 2024-05-02 ENCOUNTER — Encounter: Payer: Self-pay | Admitting: Internal Medicine

## 2024-05-02 ENCOUNTER — Ambulatory Visit: Admitting: Internal Medicine

## 2024-05-02 VITALS — BP 116/80 | HR 76 | Temp 98.8°F | Ht 63.0 in | Wt 134.0 lb

## 2024-05-02 DIAGNOSIS — E1169 Type 2 diabetes mellitus with other specified complication: Secondary | ICD-10-CM | POA: Diagnosis not present

## 2024-05-02 DIAGNOSIS — K219 Gastro-esophageal reflux disease without esophagitis: Secondary | ICD-10-CM | POA: Diagnosis not present

## 2024-05-02 DIAGNOSIS — E785 Hyperlipidemia, unspecified: Secondary | ICD-10-CM | POA: Diagnosis not present

## 2024-05-02 DIAGNOSIS — M542 Cervicalgia: Secondary | ICD-10-CM

## 2024-05-02 DIAGNOSIS — Z2989 Encounter for other specified prophylactic measures: Secondary | ICD-10-CM | POA: Diagnosis not present

## 2024-05-02 MED ORDER — TIZANIDINE HCL 4 MG PO TABS: ORAL_TABLET | ORAL | 0 refills | Status: AC

## 2024-05-02 MED ORDER — FAMOTIDINE 20 MG PO TABS: 20.0000 mg | ORAL_TABLET | Freq: Two times a day (BID) | ORAL | 1 refills | Status: AC

## 2024-05-02 MED ORDER — MEFLOQUINE HCL 250 MG PO TABS: 250.0000 mg | ORAL_TABLET | ORAL | 0 refills | Status: AC

## 2024-05-02 NOTE — Progress Notes (Signed)
 I,Jameka J Llittleton, CMA,acting as a Neurosurgeon for [name], MD.,have documented all relevant documentation on the behalf of [name], MD,as directed by  [name], MD while in the presence of [name], MD.  Subjective:  Patient ID: Virginia Mcdonald , female    DOB: Dec 02, 1975 , 48 y.o.   MRN: [id number]  Chief Complaint  Patient presents with   Diabetes    Patient presents today for a dm check. Patient reports compliance with her meds. Patient denies having chest pain,sob or headaches at this time. Patient doesn't have any questions or concerns at this time.    Gastroesophageal Reflux    Patient reports she has been having really bad heartburn at night. She reports it started about 3 weeks ago.    preventative    Patient reports she is going to [place] soon and Is unsure if she needs a prescription for malaria pills.    HPI Discussed the use of AI scribe software for clinical note transcription with the patient, who gave verbal consent to proceed.  History of Present Illness Virginia Mcdonald is a 48 year old female with diabetes who presents for a diabetes check.  She is currently managing her diabetes with Ozempic  0.25 mg and Farxiga . Her last HbA1c was 7.5% in May. She has maintained a stable weight since June, with a total weight loss of seven pounds since May. Her appetite is adequate, and she ensures sufficient protein intake. She engages in walking to prevent muscle atrophy.  She is planning a trip to [place] from September 7th to approximately September 28th, visiting three countries. She inquires about anti-malarial medication, specifically mefloquine . She recalls being bitten by mosquitoes during a previous visit to [place].  She mentions needing an eye exam, although she believes she had one earlier this year. She plans to confirm this when she takes her husband to the same doctor. Her eye doctor is Dr. Huey at [facility].  She has seen a  neurologist, Dr. Vear, in June. She notes that she can now turn her neck and perform activities she previously could not. She takes a muscle relaxer, which she feels has helped alleviate her pain.   Diabetes She presents for her follow-up diabetic visit. She has type 2 diabetes mellitus. Her disease course has been stable. There are no hypoglycemic associated symptoms. Pertinent negatives for hypoglycemia include no dizziness or headaches. Pertinent negatives for diabetes include no polydipsia, no polyphagia and no polyuria. There are no hypoglycemic complications. There are no diabetic complications. Risk factors for coronary artery disease include diabetes mellitus. (Blood sugars in am 89 -131 range. )  Hyperlipidemia This is a chronic problem. The current episode started more than 1 year ago. Exacerbating diseases include diabetes. Current antihyperlipidemic treatment includes ezetimibe . Risk factors for coronary artery disease include diabetes mellitus and dyslipidemia.     Past Medical History:  Diagnosis Date   Diet-controlled diabetes mellitus (HCC)    type 2   Fibroids      Family History  Problem Relation Age of Onset   Diabetes Mother    Hypertension Mother    Hypertension Father    Hypertension Sister    Diabetes Sister    Healthy Brother    Healthy Brother    Healthy Brother    Healthy Daughter    Healthy Daughter    Healthy Daughter    Colon cancer Neg Hx    Colon polyps Neg Hx    Esophageal  cancer Neg Hx    Rectal cancer Neg Hx    Stomach cancer Neg Hx      Current Outpatient Medications:    Accu-Chek FastClix Lancets MISC, Use as directed to check blood sugars 1 time per day dx: e11.65, Disp: 50 each, Rfl: 11   aspirin  EC 81 MG tablet, Take 1 tablet (81 mg total) by mouth daily. Swallow whole., Disp: 120 tablet, Rfl: 1   atorvastatin  (LIPITOR) 20 MG tablet, Take 1 tablet (20 mg total) by mouth daily., Disp: 30 tablet, Rfl: 11   Blood Glucose Monitoring Suppl  (BLOOD GLUCOSE MONITOR SYSTEM) w/Device KIT, 1 each by Does not apply route in the morning, at noon, and at bedtime., Disp: 1 kit, Rfl: 0   cholecalciferol (VITAMIN D3) 25 MCG (1000 UNIT) tablet, Take 1,000 Units by mouth daily., Disp: , Rfl:    Continuous Glucose Sensor (DEXCOM G7 SENSOR) MISC, Use daily to monitor blood sugar readings., Disp: 3 each, Rfl: 2   Continuous Glucose Sensor (FREESTYLE LIBRE 3 SENSOR) MISC, USE TO CHECK BLOOD SUGAR THREE TIMES DAILY, Disp: 3 each, Rfl: 0   dapagliflozin  propanediol (FARXIGA ) 10 MG TABS tablet, Take 1 tablet (10 mg total) by mouth daily before breakfast., Disp: 90 tablet, Rfl: 1   famotidine  (PEPCID ) 20 MG tablet, Take 1 tablet (20 mg total) by mouth 2 (two) times daily. As needed, Disp: 60 tablet, Rfl: 1   glucose blood (ACCU-CHEK GUIDE) test strip, Use as instructed to check blood sugars 1 time per day dx: e11.65, Disp: 50 each, Rfl: 11   mefloquine  (LARIAM ) 250 MG tablet, Take 1 tablet (250 mg total) by mouth every 7 (seven) days., Disp: 9 tablet, Rfl: 0   Omega-3 Fatty Acids (OMEGA 3 500 PO), Take by mouth., Disp: , Rfl:    Prenatal Vit-Fe Fumarate-FA (PRENATAL VITAMINS PO), Take by mouth., Disp: , Rfl:    Semaglutide ,0.25 or 0.5MG /DOS, 2 MG/3ML SOPN, Inject 0.25mg  weekly, Disp: 3 mL, Rfl: 1   tiZANidine  (ZANAFLEX ) 4 MG tablet, 1/2 to 1 po up to tid prn, Disp: 30 tablet, Rfl: 0   Allergies  Allergen Reactions   Penicillins Anaphylaxis   Claritin-D 24 Hour [Loratadine-Pseudoephedrine Er] Other (See Comments)    Pt reports she ran a fever, became very fatigue.    Loratadine-Pseudoephedrine Er Other (See Comments)    Pt reports she ran a fever, became very fatigue.   Shellfish Allergy Hives   Other Hives   Procardia [Nifedipine] Palpitations     Review of Systems  Constitutional: Negative.   Eyes: Negative.   Respiratory: Negative.    Cardiovascular: Negative.   Gastrointestinal: Negative.        She c/o increased heartburn. These are new  sx for her  Endocrine: Negative for polydipsia, polyphagia and polyuria.  Musculoskeletal:  Positive for neck pain.  Skin: Negative.   Neurological:  Negative for dizziness and headaches.  Psychiatric/Behavioral: Negative.       Today's Vitals   05/02/24 1528  BP: 116/80  Pulse: 76  Temp: 98.8 F (37.1 C)  TempSrc: Oral  Weight: 134 lb (60.8 kg)  Height: 5' 3 (1.6 m)  PainSc: 0-No pain   Body mass index is 23.74 kg/m.  Wt Readings from Last 3 Encounters:  05/02/24 134 lb (60.8 kg)  02/24/24 134 lb 8 oz (61 kg)  01/26/24 141 lb 12.8 oz (64.3 kg)    The 10-year ASCVD risk score (Arnett DK, et al., 2019) is: 1.4%   Values used to  calculate the score:     Age: 89 years     Clincally relevant sex: Female     Is Non-Hispanic African American: Yes     Diabetic: Yes     Tobacco smoker: No     Systolic Blood Pressure: 116 mmHg     Is BP treated: No     HDL Cholesterol: 61 mg/dL     Total Cholesterol: 149 mg/dL  Objective:  Physical Exam Vitals and nursing note reviewed.  Constitutional:      Appearance: Normal appearance.  HENT:     Head: Normocephalic and atraumatic.  Eyes:     Extraocular Movements: Extraocular movements intact.  Cardiovascular:     Rate and Rhythm: Normal rate and regular rhythm.     Heart sounds: Normal heart sounds.  Pulmonary:     Effort: Pulmonary effort is normal.     Breath sounds: Normal breath sounds.  Musculoskeletal:     Cervical back: Normal range of motion.  Skin:    General: Skin is warm.  Neurological:     General: No focal deficit present.     Mental Status: She is alert.  Psychiatric:        Mood and Affect: Mood normal.        Behavior: Behavior normal.         Assessment And Plan:  Dyslipidemia associated with type 2 diabetes mellitus (HCC) Assessment & Plan: Type 2 diabetes mellitus with previous A1c of 7.5%. Current treatment includes Ozempic  and Farxiga , aiding cardiac and renal protection. Weight management  successful with 7-pound loss since May. - Continue Ozempic  0.25 mg. - Continue Farxiga . - Order A1c test. - Ensure adequate protein intake. - Encourage regular exercise.  Orders: -     CMP14+EGFR -     Hemoglobin A1c  Gastroesophageal reflux disease without esophagitis Assessment & Plan: She is advised to stop eating 3 hours prior to lying down. Avoid known triggers.  - Will send rx pepcid  20mg  po BID prn.    Cervicalgia Assessment & Plan: Chronic neck pain improved with muscle relaxer, enhancing mobility. Advised against chiropractic manipulation and roller coaster rides. - Continue muscle relaxer. - Avoid chiropractic manipulation. - Avoid roller coaster rides. - Continue aspirin . - Refer to neurologist if new symptoms occur.   Need for malaria prophylaxis Assessment & Plan: Pre-exposure prophylaxis for malaria needed for [place] trip. Mefloquine  preferred, but availability uncertain. Prophylaxis to start one week before travel, continue during, and four weeks post-return. - Prescribe mefloquine  for 9 weeks (1 week before travel, during travel, and 4 weeks after return). - Check availability of mefloquine  at [facility], and [facility]. - Consider alternative prophylaxis if mefloquine  is unavailable.   Other orders -     Mefloquine  HCl; Take 1 tablet (250 mg total) by mouth every 7 (seven) days.  Dispense: 9 tablet; Refill: 0 -     Famotidine ; Take 1 tablet (20 mg total) by mouth 2 (two) times daily. As needed  Dispense: 60 tablet; Refill: 1 -     tiZANidine  HCl; 1/2 to 1 po up to tid prn  Dispense: 30 tablet; Refill: 0    Return for controlled DM check-4 months.  Patient was given opportunity to ask questions. Patient verbalized understanding of the plan and was able to repeat key elements of the plan. All questions were answered to their satisfaction.    I, [name], MD, have reviewed all documentation for this visit. The documentation on 05/02/24 for  the exam, diagnosis, procedures, and orders are all accurate and complete.   IF YOU HAVE BEEN REFERRED TO A SPECIALIST, IT MAY TAKE 1-2 WEEKS TO SCHEDULE/PROCESS THE REFERRAL. IF YOU HAVE NOT HEARD FROM US /SPECIALIST IN TWO WEEKS, PLEASE GIVE US  A CALL AT [phone].

## 2024-05-02 NOTE — Assessment & Plan Note (Signed)
 Chronic neck pain improved with muscle relaxer, enhancing mobility. Advised against chiropractic manipulation and roller coaster rides. - Continue muscle relaxer. - Avoid chiropractic manipulation. - Avoid roller coaster rides. - Continue aspirin . - Refer to neurologist if new symptoms occur.

## 2024-05-02 NOTE — Assessment & Plan Note (Signed)
 Type 2 diabetes mellitus with previous A1c of 7.5%. Current treatment includes Ozempic  and Farxiga , aiding cardiac and renal protection. Weight management successful with 7-pound loss since May. - Continue Ozempic  0.25 mg. - Continue Farxiga . - Order A1c test. - Ensure adequate protein intake. - Encourage regular exercise.

## 2024-05-02 NOTE — Assessment & Plan Note (Signed)
 She is advised to stop eating 3 hours prior to lying down. Avoid known triggers.  - Will send rx pepcid  20mg  po BID prn.

## 2024-05-02 NOTE — Assessment & Plan Note (Signed)
 Pre-exposure prophylaxis for malaria needed for [place] trip. Mefloquine  preferred, but availability uncertain. Prophylaxis to start one week before travel, continue during, and four weeks post-return. - Prescribe mefloquine  for 9 weeks (1 week before travel, during travel, and 4 weeks after return). - Check availability of [facility]floquine  at [facility], and [facility]. - Consider alternative prophylaxis if mefloquine  is unavailable.

## 2024-05-02 NOTE — Patient Instructions (Signed)

## 2024-05-03 ENCOUNTER — Ambulatory Visit: Payer: Self-pay | Admitting: Internal Medicine

## 2024-05-03 LAB — CMP14+EGFR
ALT: 11 IU/L (ref 0–32)
AST: 14 IU/L (ref 0–40)
Albumin: 4.4 g/dL (ref 3.9–4.9)
Alkaline Phosphatase: 93 IU/L (ref 44–121)
BUN/Creatinine Ratio: 13 (ref 9–23)
BUN: 11 mg/dL (ref 6–24)
Bilirubin Total: 0.2 mg/dL (ref 0.0–1.2)
CO2: 24 mmol/L (ref 20–29)
Calcium: 9.9 mg/dL (ref 8.7–10.2)
Chloride: 101 mmol/L (ref 96–106)
Creatinine, Ser: 0.85 mg/dL (ref 0.57–1.00)
Globulin, Total: 3 g/dL (ref 1.5–4.5)
Glucose: 95 mg/dL (ref 70–99)
Potassium: 4.5 mmol/L (ref 3.5–5.2)
Sodium: 140 mmol/L (ref 134–144)
Total Protein: 7.4 g/dL (ref 6.0–8.5)
eGFR: 85 mL/min/1.73 (ref >59)

## 2024-05-03 LAB — HEMOGLOBIN A1C
Est. average glucose Bld gHb Est-mCnc: 137 mg/dL
Hgb A1c MFr Bld: 6.4 % — ABNORMAL HIGH (ref 4.8–5.6)

## 2024-05-10 ENCOUNTER — Ambulatory Visit: Admitting: Internal Medicine

## 2024-05-17 ENCOUNTER — Ambulatory Visit: Admitting: Internal Medicine

## 2024-06-22 LAB — OPHTHALMOLOGY REPORT-SCANNED

## 2024-08-17 ENCOUNTER — Ambulatory Visit: Payer: Self-pay | Admitting: Internal Medicine

## 2024-08-17 ENCOUNTER — Encounter: Payer: Self-pay | Admitting: Internal Medicine

## 2024-08-17 VITALS — BP 110/78 | HR 87 | Temp 98.3°F | Ht 63.0 in | Wt 128.2 lb

## 2024-08-17 DIAGNOSIS — E785 Hyperlipidemia, unspecified: Secondary | ICD-10-CM

## 2024-08-17 DIAGNOSIS — E1169 Type 2 diabetes mellitus with other specified complication: Secondary | ICD-10-CM

## 2024-08-17 DIAGNOSIS — K219 Gastro-esophageal reflux disease without esophagitis: Secondary | ICD-10-CM | POA: Diagnosis not present

## 2024-08-17 LAB — BMP8+EGFR
BUN/Creatinine Ratio: 18 (ref 9–23)
BUN: 14 mg/dL (ref 6–24)
CO2: 24 mmol/L (ref 20–29)
Calcium: 9.5 mg/dL (ref 8.7–10.2)
Chloride: 101 mmol/L (ref 96–106)
Creatinine, Ser: 0.78 mg/dL (ref 0.57–1.00)
Glucose: 86 mg/dL (ref 70–99)
Potassium: 4.6 mmol/L (ref 3.5–5.2)
Sodium: 139 mmol/L (ref 134–144)
eGFR: 94 mL/min/1.73 (ref >59)

## 2024-08-17 LAB — LIPID PANEL
Chol/HDL Ratio: 3.1 ratio (ref 0.0–4.4)
Cholesterol, Total: 192 mg/dL (ref 100–199)
HDL: 62 mg/dL (ref >39)
LDL Chol Calc (NIH): 121 mg/dL — ABNORMAL HIGH (ref 0–99)
Triglycerides: 48 mg/dL (ref 0–149)
VLDL Cholesterol Cal: 9 mg/dL (ref 5–40)

## 2024-08-17 LAB — HEMOGLOBIN A1C
Est. average glucose Bld gHb Est-mCnc: 137 mg/dL
Hgb A1c MFr Bld: 6.4 % — ABNORMAL HIGH (ref 4.8–5.6)

## 2024-08-17 MED ORDER — ATORVASTATIN CALCIUM 20 MG PO TABS: 20.0000 mg | ORAL_TABLET | Freq: Every day | ORAL | 2 refills | Status: DC

## 2024-08-17 NOTE — Assessment & Plan Note (Signed)
 Blood glucose well-controlled, GMI 85% time in range, occasional lows, no hypoglycemia symptoms. Current medications: Farxiga , Ozempic  0.25 mg. Using [place] sensor with occasional discrepancies.  LDL goal is less than 70.  - Continue Farxiga  and Ozempic  0.25 mg. - Ensure Libre sensor functioning properly, Bluetooth on for accurate readings. - Follow-up in March.

## 2024-08-17 NOTE — Progress Notes (Signed)
 I,Victoria T Emmitt, CMA,acting as a neurosurgeon for [name], MD.,have documented all relevant documentation on the behalf of [name], MD,as directed by  [name], MD while in the presence of [name], MD.  Subjective:  Patient ID: Virginia Mcdonald , female    DOB: 27-Mar-1976 , 48 y.o.   MRN: [id number]  Chief Complaint  Patient presents with   Diabetes    Patient presents today for dm follow up. She reports compliance with medications. Denies headache, chest pain & sob.    HPI Discussed the use of AI scribe software for clinical note transcription with the patient, who gave verbal consent to proceed.  History of Present Illness Virginia Mcdonald is a 48 year old female with diabetes who presents for diabetes management.  She monitors her blood sugar levels using a Libre device and finger sticks. Her blood sugar levels are typically in the two-digit range, around 99, 89, and sometimes in the 70s. The finger stick readings are often lower than the Libre readings. She denies feeling 'funny' or experiencing symptoms of hypoglycemia.  She is currently taking aspirin  as a blood thinner, Farxiga , and Ozempic  at a dose of 0.25 mg. She received a flu shot in October at a [facility] before her last trip. She has not received a COVID-19 vaccine recently and is waiting to see if any new vaccines are recommended. She sometimes wears a mask on planes, which she finds helps her breathe better.   Diabetes She presents for her follow-up diabetic visit. She has type 2 diabetes mellitus. Her disease course has been stable. There are no hypoglycemic associated symptoms. Pertinent negatives for hypoglycemia include no dizziness. Pertinent negatives for diabetes include no polydipsia, no polyphagia and no polyuria. There are no hypoglycemic complications. There are no diabetic complications. Risk factors for coronary artery disease include diabetes mellitus. (Blood sugars in am 89  -131 range. )  Hyperlipidemia This is a chronic problem. The current episode started more than 1 year ago. Exacerbating diseases include diabetes. Current antihyperlipidemic treatment includes ezetimibe . Risk factors for coronary artery disease include diabetes mellitus and dyslipidemia.     Past Medical History:  Diagnosis Date   Diet-controlled diabetes mellitus (HCC)    type 2   Fibroids      Family History  Problem Relation Age of Onset   Diabetes Mother    Hypertension Mother    Hypertension Father    Hypertension Sister    Diabetes Sister    Healthy Brother    Healthy Brother    Healthy Brother    Healthy Daughter    Healthy Daughter    Healthy Daughter    Colon cancer Neg Hx    Colon polyps Neg Hx    Esophageal cancer Neg Hx    Rectal cancer Neg Hx    Stomach cancer Neg Hx      Current Outpatient Medications:    Accu-Chek FastClix Lancets MISC, Use as directed to check blood sugars 1 time per day dx: e11.65, Disp: 50 each, Rfl: 11   aspirin  EC 81 MG tablet, Take 1 tablet (81 mg total) by mouth daily. Swallow whole., Disp: 120 tablet, Rfl: 1   Blood Glucose Monitoring Suppl (BLOOD GLUCOSE MONITOR SYSTEM) w/Device KIT, 1 each by Does not apply route in the morning, at noon, and at bedtime., Disp: 1 kit, Rfl: 0   cholecalciferol (VITAMIN D3) 25 MCG (1000 UNIT) tablet, Take 1,000 Units by mouth daily., Disp: , Rfl:  Continuous Glucose Sensor (DEXCOM G7 SENSOR) MISC, Use daily to monitor blood sugar readings., Disp: 3 each, Rfl: 2   Continuous Glucose Sensor (FREESTYLE LIBRE 3 SENSOR) MISC, USE TO CHECK BLOOD SUGAR THREE TIMES DAILY, Disp: 3 each, Rfl: 0   dapagliflozin  propanediol (FARXIGA ) 10 MG TABS tablet, Take 1 tablet (10 mg total) by mouth daily before breakfast., Disp: 90 tablet, Rfl: 1   famotidine  (PEPCID ) 20 MG tablet, Take 1 tablet (20 mg total) by mouth 2 (two) times daily. As needed, Disp: 60 tablet, Rfl: 1   glucose blood (ACCU-CHEK GUIDE) test strip, Use  as instructed to check blood sugars 1 time per day dx: e11.65, Disp: 50 each, Rfl: 11   mefloquine  (LARIAM ) 250 MG tablet, Take 1 tablet (250 mg total) by mouth every 7 (seven) days., Disp: 9 tablet, Rfl: 0   Omega-3 Fatty Acids (OMEGA 3 500 PO), Take by mouth., Disp: , Rfl:    Prenatal Vit-Fe Fumarate-FA (PRENATAL VITAMINS PO), Take by mouth., Disp: , Rfl:    Semaglutide ,0.25 or 0.5MG /DOS, 2 MG/3ML SOPN, Inject 0.25mg  weekly, Disp: 3 mL, Rfl: 1   tiZANidine  (ZANAFLEX ) 4 MG tablet, 1/2 to 1 po up to tid prn, Disp: 30 tablet, Rfl: 0   atorvastatin  (LIPITOR) 20 MG tablet, Take 1 tablet (20 mg total) by mouth daily., Disp: 90 tablet, Rfl: 2   Allergies  Allergen Reactions   Penicillins Anaphylaxis   Claritin-D 24 Hour [Loratadine-Pseudoephedrine Er] Other (See Comments)    Pt reports she ran a fever, became very fatigue.    Loratadine-Pseudoephedrine Er Other (See Comments)    Pt reports she ran a fever, became very fatigue.   Shellfish Allergy Hives   Other Hives   Procardia [Nifedipine] Palpitations     Review of Systems  Constitutional: Negative.   Respiratory: Negative.    Cardiovascular: Negative.   Gastrointestinal: Negative.   Endocrine: Negative for polydipsia, polyphagia and polyuria.  Neurological: Negative.  Negative for dizziness.  Psychiatric/Behavioral: Negative.       Today's Vitals   08/17/24 0843  BP: 110/78  Pulse: 87  Temp: 98.3 F (36.8 C)  SpO2: 98%  Weight: 128 lb 3.2 oz (58.2 kg)  Height: 5' 3 (1.6 m)   Body mass index is 22.71 kg/m.  Wt Readings from Last 3 Encounters:  08/17/24 128 lb 3.2 oz (58.2 kg)  05/02/24 134 lb (60.8 kg)  02/24/24 134 lb 8 oz (61 kg)    The 10-year ASCVD risk score (Arnett DK, et al., 2019) is: 1.1%   Values used to calculate the score:     Age: 45 years     Clincally relevant sex: Female     Is Non-Hispanic African American: Yes     Diabetic: Yes     Tobacco smoker: No     Systolic Blood Pressure: 110 mmHg     Is  BP treated: No     HDL Cholesterol: 61 mg/dL     Total Cholesterol: 149 mg/dL  Objective:  Physical Exam Vitals and nursing note reviewed.  Constitutional:      Appearance: Normal appearance.  HENT:     Head: Normocephalic and atraumatic.  Eyes:     Extraocular Movements: Extraocular movements intact.  Cardiovascular:     Rate and Rhythm: Normal rate and regular rhythm.     Heart sounds: Normal heart sounds.  Pulmonary:     Effort: Pulmonary effort is normal.     Breath sounds: Normal breath sounds.  Musculoskeletal:  Cervical back: Normal range of motion.  Skin:    General: Skin is warm.  Neurological:     General: No focal deficit present.     Mental Status: She is alert.  Psychiatric:        Mood and Affect: Mood normal.        Behavior: Behavior normal.       Assessment And Plan:   Assessment & Plan Dyslipidemia associated with type 2 diabetes mellitus (HCC) Blood glucose well-controlled, GMI 85% time in range, occasional lows, no hypoglycemia symptoms. Current medications: Farxiga , Ozempic  0.25 mg. Using [place] sensor with occasional discrepancies.  LDL goal is less than 70.  - Continue Farxiga  and Ozempic  0.25 mg. - Ensure Libre sensor functioning properly, Bluetooth on for accurate readings. - Follow-up in March. Gastroesophageal reflux disease without esophagitis Chronic, sx have improved. Now using famotidine  prn.     Orders Placed This Encounter  Procedures   Hemoglobin A1c   BMP8+EGFR   Lipid panel     Return if symptoms worsen or fail to improve.  Patient was given opportunity to ask questions. Patient verbalized understanding of the plan and was able to repeat key elements of the plan. All questions were answered to their satisfaction.    I, [name], MD, have reviewed all documentation for this visit. The documentation on 08/17/24 for the exam, diagnosis, procedures, and orders are all accurate and complete.   IF YOU HAVE BEEN REFERRED  TO A SPECIALIST, IT MAY TAKE 1-2 WEEKS TO SCHEDULE/PROCESS THE REFERRAL. IF YOU HAVE NOT HEARD FROM US /SPECIALIST IN TWO WEEKS, PLEASE GIVE US  A CALL AT [phone].

## 2024-08-17 NOTE — Assessment & Plan Note (Signed)
 Chronic, sx have improved. Now using famotidine  prn.

## 2024-08-17 NOTE — Patient Instructions (Signed)

## 2024-08-20 ENCOUNTER — Ambulatory Visit: Payer: Self-pay | Admitting: Internal Medicine

## 2024-08-22 ENCOUNTER — Ambulatory Visit: Payer: Self-pay | Admitting: Internal Medicine

## 2024-08-22 ENCOUNTER — Other Ambulatory Visit: Payer: Self-pay | Admitting: Internal Medicine

## 2024-08-22 MED ORDER — ATORVASTATIN CALCIUM 40 MG PO TABS: 40.0000 mg | ORAL_TABLET | Freq: Every day | ORAL | 11 refills | Status: AC

## 2024-09-20 ENCOUNTER — Other Ambulatory Visit: Payer: Self-pay | Admitting: Internal Medicine

## 2024-09-20 DIAGNOSIS — E1169 Type 2 diabetes mellitus with other specified complication: Secondary | ICD-10-CM

## 2024-09-21 ENCOUNTER — Other Ambulatory Visit: Payer: Self-pay

## 2024-09-21 DIAGNOSIS — E1169 Type 2 diabetes mellitus with other specified complication: Secondary | ICD-10-CM

## 2024-09-21 MED ORDER — SEMAGLUTIDE(0.25 OR 0.5MG/DOS) 2 MG/3ML ~~LOC~~ SOPN: PEN_INJECTOR | SUBCUTANEOUS | 1 refills | Status: AC

## 2024-09-21 MED ORDER — DAPAGLIFLOZIN PROPANEDIOL 10 MG PO TABS: 10.0000 mg | ORAL_TABLET | Freq: Every day | ORAL | 0 refills | Status: AC

## 2024-09-25 ENCOUNTER — Other Ambulatory Visit: Payer: Self-pay | Admitting: Internal Medicine

## 2024-10-10 ENCOUNTER — Encounter (HOSPITAL_BASED_OUTPATIENT_CLINIC_OR_DEPARTMENT_OTHER): Payer: Self-pay

## 2024-10-10 ENCOUNTER — Emergency Department (HOSPITAL_BASED_OUTPATIENT_CLINIC_OR_DEPARTMENT_OTHER): Admission: EM | Admit: 2024-10-10 | Discharge: 2024-10-10 | Disposition: A | Discharge status: Home / Self Care

## 2024-10-10 ENCOUNTER — Emergency Department (HOSPITAL_BASED_OUTPATIENT_CLINIC_OR_DEPARTMENT_OTHER)

## 2024-10-10 ENCOUNTER — Other Ambulatory Visit: Payer: Self-pay

## 2024-10-10 DIAGNOSIS — E119 Type 2 diabetes mellitus without complications: Secondary | ICD-10-CM | POA: Insufficient documentation

## 2024-10-10 DIAGNOSIS — Z7984 Long term (current) use of oral hypoglycemic drugs: Secondary | ICD-10-CM | POA: Insufficient documentation

## 2024-10-10 DIAGNOSIS — X58XXXA Exposure to other specified factors, initial encounter: Secondary | ICD-10-CM | POA: Insufficient documentation

## 2024-10-10 DIAGNOSIS — Z7982 Long term (current) use of aspirin: Secondary | ICD-10-CM | POA: Insufficient documentation

## 2024-10-10 DIAGNOSIS — R10A1 Flank pain, right side: Secondary | ICD-10-CM | POA: Insufficient documentation

## 2024-10-10 DIAGNOSIS — S29012A Strain of muscle and tendon of back wall of thorax, initial encounter: Secondary | ICD-10-CM | POA: Insufficient documentation

## 2024-10-10 DIAGNOSIS — T148XXA Other injury of unspecified body region, initial encounter: Secondary | ICD-10-CM

## 2024-10-10 LAB — BASIC METABOLIC PANEL WITH GFR
Anion gap: 11 (ref 5–15)
BUN: 15 mg/dL (ref 6–20)
CO2: 27 mmol/L (ref 22–32)
Calcium: 10.3 mg/dL (ref 8.9–10.3)
Chloride: 98 mmol/L (ref 98–111)
Creatinine, Ser: 0.72 mg/dL (ref 0.44–1.00)
GFR, Estimated: >60 mL/min
Glucose, Bld: 122 mg/dL — ABNORMAL HIGH (ref 70–99)
Potassium: 4.2 mmol/L (ref 3.5–5.1)
Sodium: 136 mmol/L (ref 135–145)

## 2024-10-10 LAB — URINALYSIS, ROUTINE W REFLEX MICROSCOPIC
Bacteria, UA: NONE SEEN
Bilirubin Urine: NEGATIVE
Glucose, UA: >1000 mg/dL — AB
Hgb urine dipstick: NEGATIVE
Ketones, ur: NEGATIVE mg/dL
Leukocytes,Ua: NEGATIVE
Nitrite: NEGATIVE
Protein, ur: NEGATIVE mg/dL
Specific Gravity, Urine: 1.034 — ABNORMAL HIGH (ref 1.005–1.030)
pH: 5.5 (ref 5.0–8.0)

## 2024-10-10 LAB — CBC
HCT: 45.6 % (ref 36.0–46.0)
Hemoglobin: 15 g/dL (ref 12.0–15.0)
MCH: 29.1 pg (ref 26.0–34.0)
MCHC: 32.9 g/dL (ref 30.0–36.0)
MCV: 88.4 fL (ref 80.0–100.0)
Platelets: 152 10*3/uL (ref 150–400)
RBC: 5.16 MIL/uL — ABNORMAL HIGH (ref 3.87–5.11)
RDW: 15 % (ref 11.5–15.5)
WBC: 6.4 10*3/uL (ref 4.0–10.5)
nRBC: 0 % (ref 0.0–0.2)

## 2024-10-10 NOTE — ED Triage Notes (Signed)
 Pt reports lower back pain radiating to R side w/ nausea. Pt denies any urinary S/S. Pt denies any hx of kidney stones. Pt denies any injuries or trauma.

## 2024-10-10 NOTE — Discharge Instructions (Signed)
 Please use Tylenol or ibuprofen for pain.  You may use 600 mg ibuprofen every 6 hours or 1000 mg of Tylenol every 6 hours.  You may choose to alternate between the 2.  This would be most effective.  Not to exceed 4 g of Tylenol within 24 hours.  Not to exceed 3200 mg ibuprofen 24 hours.

## 2024-10-10 NOTE — ED Notes (Signed)
 Pt d/c instructions, medications, and follow-up care reviewed with pt. Pt verbalized understanding and had no further questions at time of d/c. Pt CA&Ox4, ambulatory, and in NAD at time of d/c

## 2024-11-10 ENCOUNTER — Encounter: Admitting: Internal Medicine

## 2024-11-14 ENCOUNTER — Encounter: Payer: Self-pay | Admitting: Internal Medicine
